# Patient Record
Sex: Female | Born: 1945 | Race: White | Hispanic: No | Marital: Married | Smoking: Former smoker
Health system: Southern US, Community
[De-identification: ages and names within clinical notes are randomized; demographics above are authoritative.]

## PROBLEM LIST (undated history)

## (undated) DIAGNOSIS — D649 Anemia, unspecified: Secondary | ICD-10-CM

## (undated) DIAGNOSIS — IMO0002 Reserved for concepts with insufficient information to code with codable children: Secondary | ICD-10-CM

## (undated) DIAGNOSIS — M1990 Unspecified osteoarthritis, unspecified site: Secondary | ICD-10-CM

## (undated) DIAGNOSIS — I10 Essential (primary) hypertension: Secondary | ICD-10-CM

## (undated) DIAGNOSIS — E785 Hyperlipidemia, unspecified: Secondary | ICD-10-CM

## (undated) DIAGNOSIS — K5792 Diverticulitis of intestine, part unspecified, without perforation or abscess without bleeding: Secondary | ICD-10-CM

## (undated) DIAGNOSIS — R011 Cardiac murmur, unspecified: Secondary | ICD-10-CM

## (undated) DIAGNOSIS — H269 Unspecified cataract: Secondary | ICD-10-CM

## (undated) DIAGNOSIS — E079 Disorder of thyroid, unspecified: Secondary | ICD-10-CM

## (undated) DIAGNOSIS — B019 Varicella without complication: Secondary | ICD-10-CM

## (undated) DIAGNOSIS — R030 Elevated blood-pressure reading, without diagnosis of hypertension: Secondary | ICD-10-CM

## (undated) DIAGNOSIS — K219 Gastro-esophageal reflux disease without esophagitis: Secondary | ICD-10-CM

## (undated) DIAGNOSIS — M329 Systemic lupus erythematosus, unspecified: Secondary | ICD-10-CM

## (undated) HISTORY — DX: Reserved for concepts with insufficient information to code with codable children: IMO0002

## (undated) HISTORY — DX: Elevated blood-pressure reading, without diagnosis of hypertension: R03.0

## (undated) HISTORY — DX: Systemic lupus erythematosus, unspecified: M32.9

## (undated) HISTORY — PX: TONSILLECTOMY AND ADENOIDECTOMY: SUR1326

## (undated) HISTORY — PX: KNEE ARTHROSCOPY W/ ACL RECONSTRUCTION: SHX1858

## (undated) HISTORY — DX: Hyperlipidemia, unspecified: E78.5

## (undated) HISTORY — DX: Cardiac murmur, unspecified: R01.1

## (undated) HISTORY — PX: BREAST CYST ASPIRATION: SHX578

## (undated) HISTORY — PX: TUBAL LIGATION: SHX77

## (undated) HISTORY — DX: Anemia, unspecified: D64.9

## (undated) HISTORY — DX: Diverticulitis of intestine, part unspecified, without perforation or abscess without bleeding: K57.92

## (undated) HISTORY — PX: CATARACT EXTRACTION, BILATERAL: SHX1313

## (undated) HISTORY — DX: Gastro-esophageal reflux disease without esophagitis: K21.9

## (undated) HISTORY — DX: Unspecified cataract: H26.9

## (undated) HISTORY — DX: Varicella without complication: B01.9

## (undated) HISTORY — DX: Disorder of thyroid, unspecified: E07.9

## (undated) HISTORY — DX: Unspecified osteoarthritis, unspecified site: M19.90

---
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
## 1985-02-25 HISTORY — PX: ABDOMINAL HYSTERECTOMY: SHX81

## 1993-02-25 HISTORY — PX: EYE SURGERY: SHX253

## 2005-02-09 LAB — HM COLONOSCOPY: HM Colonoscopy: NORMAL

## 2011-06-14 ENCOUNTER — Telehealth: Payer: Self-pay

## 2011-06-14 ENCOUNTER — Other Ambulatory Visit: Payer: Self-pay | Admitting: Family Medicine

## 2011-06-14 NOTE — Telephone Encounter (Signed)
PT STATES THE PHARMACY HAD CALLED FOR A REFILL ON HER LISINOPRIL AND WAS TOLD WE DIDN'T HAVE ANYONE BY THAT NAME PLEASE CALL PT AT [phone]    [facility] ON [address] IN [place]

## 2011-06-14 NOTE — Telephone Encounter (Signed)
Patient states that [facility] called our clinic and clinic told them that we didn't have a patient by that name so we couldn't feel her prescription for lisinopril.  Can we send this to [facility] on [address]?

## 2011-06-15 MED ORDER — LISINOPRIL 10 MG PO TABS: 10.0000 mg | ORAL_TABLET | Freq: Every day | ORAL | Status: DC

## 2011-06-15 NOTE — Telephone Encounter (Signed)
Done via phone message 06/15/2011

## 2011-06-15 NOTE — Telephone Encounter (Signed)
Spoke with pt and advised her RX at pharmacy and to recheck before RX runs out. Pt understood

## 2011-06-15 NOTE — Telephone Encounter (Signed)
Please advise patient that the rx was sent to her pharmacy and that she's due for follow-up before the next fill.

## 2011-07-05 ENCOUNTER — Ambulatory Visit (INDEPENDENT_AMBULATORY_CARE_PROVIDER_SITE_OTHER): Payer: Self-pay | Admitting: Family Medicine

## 2011-07-05 VITALS — BP 148/75 | HR 79 | Temp 98.1°F | Resp 16 | Ht 67.5 in | Wt 185.4 lb

## 2011-07-05 DIAGNOSIS — R109 Unspecified abdominal pain: Secondary | ICD-10-CM

## 2011-07-05 LAB — POCT UA - MICROSCOPIC ONLY
Crystals, Ur, HPF, POC: NEGATIVE
Epithelial cells, urine per micros: NEGATIVE
Yeast, UA: NEGATIVE

## 2011-07-05 LAB — POCT CBC
Granulocyte percent: 60.9 %G (ref 37–80)
Hemoglobin: 12.4 g/dL (ref 12.2–16.2)
MCH, POC: 28.8 pg (ref 27–31.2)
MCV: 91.1 fL (ref 80–97)
MPV: 7.3 fL (ref 0–99.8)
RBC: 4.31 M/uL (ref 4.04–5.48)
WBC: 6.4 10*3/uL (ref 4.6–10.2)

## 2011-07-05 LAB — POCT URINALYSIS DIPSTICK
Blood, UA: NEGATIVE
Protein, UA: NEGATIVE
Spec Grav, UA: 1.015
Urobilinogen, UA: 0.2
pH, UA: 7

## 2011-07-05 MED ORDER — METRONIDAZOLE 500 MG PO TABS: 500.0000 mg | ORAL_TABLET | Freq: Four times a day (QID) | ORAL | Status: AC

## 2011-07-05 MED ORDER — CIPROFLOXACIN HCL 750 MG PO TABS: 750.0000 mg | ORAL_TABLET | Freq: Two times a day (BID) | ORAL | Status: AC

## 2011-07-05 NOTE — Progress Notes (Signed)
Patient Name: Brenda Leon Date of Birth: [birth year] Medical Record Number: [id number] Gender: female Date of Encounter: 07/05/2011  History of Present Illness:  Brenda Leon is a 66 y.o. very pleasant female patient who presents with the following:  She has had abdominal pain for about 6 weeks.  It stays pretty constant but is worse after eating.  Her belly feels sore to the touch, most in the left lower quadrant.  She had never had this before.  No nausea or vomiting,  Notes that she does not seem to have quite as much stool output as she would expect, but no BRPBR , no melena.  No change in pain with type of foot eaten.    Her last colonoscopy was done in about 2005.  No history of diverticulitis.  She was told to follow- up in 5 years but that her colonoscopy was normal.    She also notes that "my energy and stamina have just kind of tanked" over the last few weeks.  Not sleeping well- this is not unusual for her; she has a "chroinic bulging disc" in her back.  This is better but she still has occasional pain in her back and leg.    She quit smoking in 1988  There is no problem list on file for this patient.  No past medical history on file. No past surgical history on file. History  Substance Use Topics  . Smoking status: Former Games developer  . Smokeless tobacco: Not on file  . Alcohol Use: Not on file   No family history on file. No Known Allergies  Medication list has been reviewed and updated.  Review of Systems: As per HPI- otherwise negative.  Physical Examination: Filed Vitals:   07/05/11 1210  BP: 148/75  Pulse: 79  Temp: 98.1 F (36.7 C)  TempSrc: Oral  Resp: 16  Height: 5' 7.5" (1.715 m)  Weight: 185 lb 6.4 oz (84.097 kg)    Body mass index is 28.61 kg/(m^2).  GEN: WDWN, NAD, Non-toxic, A & O x 3 HEENT: Atraumatic, Normocephalic. Neck supple. No masses, No LAD. Ears and Nose: No external deformity. CV: RRR, No M/G/R. No JVD. No thrill. No extra heart  sounds. PULM: CTA B, no wheezes, crackles, rhonchi. No retractions. No resp. distress. No accessory muscle use. ABD: S, ND, +BS. No rebound. No HSM.  Tender LLQ and suprapubic areas EXTR: No c/c/e NEURO Normal gait.  PSYCH: Normally interactive. Conversant. Not depressed or anxious appearing.  Calm demeanor.  GU: normal external exam, normal bimanual- she is s/p hysterectomy but still has ovaries. She has tenderness in the LLQ and suprapubic areas  Results for orders placed in visit on 07/05/11  POCT URINALYSIS DIPSTICK      Component Value Range   Color, UA yellow     Clarity, UA clear     Glucose, UA neg     Bilirubin, UA neg     Ketones, UA neg     Spec Grav, UA 1.015     Blood, UA neg     pH, UA 7.0     Protein, UA neg     Urobilinogen, UA 0.2     Nitrite, UA neg     Leukocytes, UA Trace    POCT UA - MICROSCOPIC ONLY      Component Value Range   WBC, Ur, HPF, POC 0-1     RBC, urine, microscopic neg     Bacteria, U Microscopic neg     Mucus, UA  neg     Epithelial cells, urine per micros neg     Crystals, Ur, HPF, POC neg     Casts, Ur, LPF, POC neg     Yeast, UA neg    POCT CBC      Component Value Range   WBC 6.4  4.6 - 10.2 (K/uL)   Lymph, poc 2.0  0.6 - 3.4    POC LYMPH PERCENT 31.9  10 - 50 (%L)   MID (cbc) 0.5  0 - 0.9    POC MID % 7.2  0 - 12 (%M)   POC Granulocyte 3.9  2 - 6.9    Granulocyte percent 60.9  37 - 80 (%G)   RBC 4.31  4.04 - 5.48 (M/uL)   Hemoglobin 12.4  12.2 - 16.2 (g/dL)   HCT, POC 62.1  30.8 - 47.9 (%)   MCV 91.1  80 - 97 (fL)   MCH, POC 28.8  27 - 31.2 (pg)   MCHC 31.6 (*) 31.8 - 35.4 (g/dL)   RDW, POC 65.7     Platelet Count, POC 294  142 - 424 (K/uL)   MPV 7.3  0 - 99.8 (fL)  IFOBT (OCCULT BLOOD)      Component Value Range   IFOBT Negative      Assessment and Plan: 1. Abdominal  pain, other specified site  POCT urinalysis dipstick, POCT UA - Microscopic Only, POCT CBC, IFOBT POC (occult bld, rslt in office), ciprofloxacin (CIPRO)  750 MG tablet, metroNIDAZOLE (FLAGYL) 500 MG tablet   Brenda Leon has abdominal pain of some duration.  The etiology is uncertain, but location is suspicious for diverticulitis.  She would like to do a trial of therapy before moving to more advanced diagnostic techniques as she is currently without insurance until her medicare starts in August.  Will try flagyl and cipro as above, avoid p3articulate foods.  Patient (or parent if minor) instructed to return to clinic or call if not better in 3 day(s).  Sooner if worse.   Next step should be either a CT or referral to GI

## 2011-07-24 ENCOUNTER — Encounter: Payer: Self-pay | Admitting: Family Medicine

## 2011-08-05 NOTE — Progress Notes (Signed)
This encounter was created in error - please disregard.

## 2011-09-13 ENCOUNTER — Other Ambulatory Visit: Payer: Self-pay | Admitting: Physician Assistant

## 2011-09-14 ENCOUNTER — Other Ambulatory Visit: Payer: Self-pay | Admitting: Physician Assistant

## 2011-12-11 ENCOUNTER — Telehealth: Payer: Self-pay

## 2011-12-11 NOTE — Telephone Encounter (Signed)
We have advised patient she needs follow up since July, I do not feel comfortable approving until December, please advise. Amy

## 2011-12-11 NOTE — Telephone Encounter (Signed)
Spoke with pt advised to RTC. Pt understood. 

## 2011-12-11 NOTE — Telephone Encounter (Signed)
Yes, patient needs OV

## 2011-12-11 NOTE — Telephone Encounter (Signed)
Patient is calling for lisinopril refill. She has a new PCP in [place] but does not have an appointment with them until December and would like a refill to hold her over until then.  Best [phone]

## 2011-12-12 ENCOUNTER — Ambulatory Visit (INDEPENDENT_AMBULATORY_CARE_PROVIDER_SITE_OTHER): Payer: Medicare Other | Admitting: Emergency Medicine

## 2011-12-12 VITALS — BP 158/68 | HR 83 | Temp 97.9°F | Resp 18 | Ht 67.5 in | Wt 189.6 lb

## 2011-12-12 DIAGNOSIS — I10 Essential (primary) hypertension: Secondary | ICD-10-CM

## 2011-12-12 DIAGNOSIS — Z1231 Encounter for screening mammogram for malignant neoplasm of breast: Secondary | ICD-10-CM

## 2011-12-12 MED ORDER — LISINOPRIL 30 MG PO TABS: 30.0000 mg | ORAL_TABLET | Freq: Every day | ORAL | Status: DC

## 2011-12-12 NOTE — Progress Notes (Signed)
[facility] [address], [place] [address] [phone]  Date:  12/12/2011   Name:  Brenda Leon   DOB:  05/10/1945   MRN:  [id number]  PCP:  No primary provider on file.    Chief Complaint: Hypertension   History of Present Illness:  Brenda Leon is a 66 y.o. very pleasant female patient who presents with the following:  Hypertension for a long time and has been treated with lisinopril successfully.  Now needs a refill on her medication.  Has been without healt insurance for a long time and subsequently has no recent (years) labs or mammogram.  Not symptomatic and has no current complaints.  There is no problem list on file for this patient.   No past medical history on file.  No past surgical history on file.  History  Substance Use Topics  . Smoking status: Former Games developer  . Smokeless tobacco: Not on file  . Alcohol Use: Not on file    No family history on file.  No Known Allergies  Medication list has been reviewed and updated.  Current Outpatient Prescriptions on File Prior to Visit  Medication Sig Dispense Refill  . lisinopril (PRINIVIL,ZESTRIL) 10 MG tablet TAKE ONE TABLET BY MOUTH EVERY DAY, PLEASE MAKE AN APPOINTMENT FOR MORE REFILLS  90 tablet  0  . omeprazole (PRILOSEC) 10 MG capsule Take 10 mg by mouth daily.        Review of Systems:  As per HPI, otherwise negative.    Physical Examination: Filed Vitals:   12/12/11 1239  BP: 158/68  Pulse: 83  Temp: 97.9 F (36.6 C)  Resp: 18   Filed Vitals:   12/12/11 1239  Height: 5' 7.5" (1.715 m)  Weight: 189 lb 9.6 oz (86.002 kg)   Body mass index is 29.26 kg/(m^2). Ideal Body Weight: Weight in (lb) to have BMI = 25: 161.7   GEN: WDWN, NAD, Non-toxic, A & O x 3 HEENT: Atraumatic, Normocephalic. Neck supple. No masses, No LAD. Ears and Nose: No external deformity. CV: RRR, No M/G/R. No JVD. No thrill. No extra heart sounds. PULM: CTA B, no wheezes, crackles, rhonchi. No  retractions. No resp. distress. No accessory muscle use. ABD: S, NT, ND, +BS. No rebound. No HSM. EXTR: No c/c/e NEURO Normal gait.  PSYCH: Normally interactive. Conversant. Not depressed or anxious appearing.  Calm demeanor.    Assessment and Plan: Hypertension Increase lisinopril labs  Carmelina Dane, MD  I have reviewed and agree with documentation. Robert P. Merla Riches, M.D.

## 2011-12-20 ENCOUNTER — Other Ambulatory Visit (INDEPENDENT_AMBULATORY_CARE_PROVIDER_SITE_OTHER): Payer: Medicare Other

## 2011-12-20 VITALS — BP 118/76 | HR 69 | Temp 98.1°F | Resp 18 | Ht 67.5 in | Wt 185.0 lb

## 2011-12-20 DIAGNOSIS — I10 Essential (primary) hypertension: Secondary | ICD-10-CM

## 2011-12-20 DIAGNOSIS — E782 Mixed hyperlipidemia: Secondary | ICD-10-CM

## 2011-12-20 LAB — COMPREHENSIVE METABOLIC PANEL
AST: 20 U/L (ref 0–37)
Albumin: 4.5 g/dL (ref 3.5–5.2)
Alkaline Phosphatase: 57 U/L (ref 39–117)
BUN: 20 mg/dL (ref 6–23)
Creat: 0.61 mg/dL (ref 0.50–1.10)
Glucose, Bld: 92 mg/dL (ref 70–99)
Total Bilirubin: 0.5 mg/dL (ref 0.3–1.2)

## 2011-12-20 LAB — POCT URINALYSIS DIPSTICK
Blood, UA: NEGATIVE
Nitrite, UA: NEGATIVE
Protein, UA: NEGATIVE
Spec Grav, UA: 1.01
Urobilinogen, UA: 0.2
pH, UA: 7

## 2011-12-20 LAB — LIPID PANEL
HDL: 54 mg/dL (ref >39)
LDL Cholesterol: 189 mg/dL — ABNORMAL HIGH (ref 0–99)
Total CHOL/HDL Ratio: 4.8 Ratio
Triglycerides: 85 mg/dL (ref <150)
VLDL: 17 mg/dL (ref 0–40)

## 2011-12-20 LAB — POCT CBC
Hemoglobin: 12.7 g/dL (ref 12.2–16.2)
Lymph, poc: 1.6 (ref 0.6–3.4)
MCH, POC: 28.9 pg (ref 27–31.2)
MCHC: 30.5 g/dL — AB (ref 31.8–35.4)
MCV: 95 fL (ref 80–97)
MID (cbc): 0.4 (ref 0–0.9)
MPV: 7.6 fL (ref 0–99.8)
POC MID %: 8.1 %M (ref 0–12)
RBC: 4.39 M/uL (ref 4.04–5.48)
WBC: 5.5 10*3/uL (ref 4.6–10.2)

## 2011-12-20 LAB — POCT UA - MICROSCOPIC ONLY
Casts, Ur, LPF, POC: NEGATIVE
Crystals, Ur, HPF, POC: NEGATIVE
Mucus, UA: NEGATIVE
Yeast, UA: NEGATIVE

## 2011-12-20 NOTE — Progress Notes (Signed)
Patient in office today for labs only. 

## 2011-12-21 MED ORDER — ATORVASTATIN CALCIUM 20 MG PO TABS: 20.0000 mg | ORAL_TABLET | Freq: Every day | ORAL | Status: DC

## 2011-12-21 NOTE — Addendum Note (Signed)
Addended by: Carmelina Dane on: 12/21/2011 03:17 PM   Modules accepted: Orders

## 2011-12-23 ENCOUNTER — Telehealth: Payer: Self-pay

## 2011-12-23 DIAGNOSIS — E7800 Pure hypercholesterolemia, unspecified: Secondary | ICD-10-CM

## 2011-12-23 NOTE — Telephone Encounter (Signed)
I spoke to patient, she wants to try a strict diet for 6 weeks and have lipids rechecked, is it okay to order lab only visit in 6 weeks for recheck lipid panel?

## 2011-12-23 NOTE — Telephone Encounter (Signed)
Yes, please.

## 2011-12-23 NOTE — Telephone Encounter (Signed)
Pt was prescribed Lipitor on last visit 12/12/11 w/ Dr. Dareen Piano.  She would like to defer taking this medication for 4-6 weeks.  She states she is making significant changes to her lifestyle and wants to know if this would be ok.   [phone] best number.

## 2012-01-03 ENCOUNTER — Ambulatory Visit (HOSPITAL_COMMUNITY)
Admission: RE | Admit: 2012-01-03 | Discharge: 2012-01-03 | Disposition: A | Discharge status: Home / Self Care | Payer: Medicare Other | Source: Ambulatory Visit | Attending: Emergency Medicine | Admitting: Emergency Medicine

## 2012-01-03 DIAGNOSIS — I10 Essential (primary) hypertension: Secondary | ICD-10-CM

## 2012-01-03 DIAGNOSIS — Z1231 Encounter for screening mammogram for malignant neoplasm of breast: Secondary | ICD-10-CM

## 2012-01-10 ENCOUNTER — Telehealth: Payer: Self-pay

## 2012-01-10 NOTE — Telephone Encounter (Signed)
Pt called back to get MM results. She had received the normal results from imaging center. Verified w/pt that the results/recommendations stated normal f/up exam in 1 year, but that they are requesting previous MM for comparison. Pt agreed to call imaging center to ask whether she needs to get them previous results and if any other f/up is needed.

## 2012-02-10 ENCOUNTER — Encounter: Payer: Self-pay | Admitting: Internal Medicine

## 2012-02-10 ENCOUNTER — Ambulatory Visit (INDEPENDENT_AMBULATORY_CARE_PROVIDER_SITE_OTHER): Payer: Medicare Other | Admitting: Internal Medicine

## 2012-02-10 ENCOUNTER — Other Ambulatory Visit: Payer: Self-pay

## 2012-02-10 VITALS — BP 120/62 | HR 73 | Temp 97.7°F | Resp 12 | Ht 67.0 in | Wt 191.0 lb

## 2012-02-10 DIAGNOSIS — Z1331 Encounter for screening for depression: Secondary | ICD-10-CM

## 2012-02-10 DIAGNOSIS — Z23 Encounter for immunization: Secondary | ICD-10-CM

## 2012-02-10 DIAGNOSIS — M5430 Sciatica, unspecified side: Secondary | ICD-10-CM

## 2012-02-10 DIAGNOSIS — M5440 Lumbago with sciatica, unspecified side: Secondary | ICD-10-CM | POA: Insufficient documentation

## 2012-02-10 DIAGNOSIS — I83893 Varicose veins of bilateral lower extremities with other complications: Secondary | ICD-10-CM

## 2012-02-10 DIAGNOSIS — M79606 Pain in leg, unspecified: Secondary | ICD-10-CM

## 2012-02-10 DIAGNOSIS — M79609 Pain in unspecified limb: Secondary | ICD-10-CM

## 2012-02-10 DIAGNOSIS — M329 Systemic lupus erythematosus, unspecified: Secondary | ICD-10-CM

## 2012-02-10 DIAGNOSIS — B351 Tinea unguium: Secondary | ICD-10-CM

## 2012-02-10 DIAGNOSIS — R252 Cramp and spasm: Secondary | ICD-10-CM

## 2012-02-10 DIAGNOSIS — E785 Hyperlipidemia, unspecified: Secondary | ICD-10-CM

## 2012-02-10 DIAGNOSIS — K219 Gastro-esophageal reflux disease without esophagitis: Secondary | ICD-10-CM

## 2012-02-10 DIAGNOSIS — I83819 Varicose veins of unspecified lower extremities with pain: Secondary | ICD-10-CM

## 2012-02-10 MED ORDER — RED YEAST RICE EXTRACT 600 MG PO CAPS: 1.0000 | ORAL_CAPSULE | Freq: Every day | ORAL | Status: DC

## 2012-02-10 NOTE — Assessment & Plan Note (Signed)
Nocturnal,  improved with quinine. checking muscle enzyme and labs.

## 2012-02-10 NOTE — Assessment & Plan Note (Addendum)
Never treated due to patient reluctance to start statins.  Trial of red yeast rice

## 2012-02-10 NOTE — Progress Notes (Signed)
Patient ID: Brenda Leon, female   DOB: 1945/06/28, 66 y.o.   MRN: [id number]   Patient Active Problem List  Diagnosis  . Leg pain, diffuse  . SLE (systemic lupus erythematosus)  . GERD (gastroesophageal reflux disease)  . Varicose veins of leg with pain  . Lumbago with sciatica  . Hyperlipidemia LDL goal < 130    Subjective:  CC:   Chief Complaint  Patient presents with  . Establish Care    HPI:   Brenda Leon is a 66 y.o. female who presents as a new patient to establish primary care with the chief complaint of  Lower back pain  Secondary to ruptured disk which occurred afer lifting a vacuum cleaner.  Had sciatica at the time which still ocjurs aggrabvated by prlonged sitting and standing.  Was uninsured so used exercise .  Referred pain mostly on the right  Below the knee but not to the ankle.  Pain in hips has been more recently which is disturbing sleep.  Using  and gabapentin at night for the past 3 or 4 months with naproxen which helps her sleep a little better. Has had MRI x 2 in [place] by Sharolyn Douglas  2) muscle cramps in legs and hands aggravated by housework,  Calves tender .  History of SLE 1996 diagnosed, diagnosed with skin biopsy and  Blood tests whiling living in [place] .  Took On Placquenil for years, stopped it in 2007 .  Repeat labs were done after move to [place] were all normal..  Has not been to a rheumatologist.   Lives iin [place].   Has not had diffuse joint pain or fatigue in years.  Had some discoid lesions.  Has hyperlipidemia LDL 189 but did not start the lipitor. 4)  history of thyroid nodule incompletel biopsy attempt x 2. treated with Synthroid for years.  synthroid stopped  Years ago and tsh was recently normal.  5) oNYCHMYCOSIS.,  NEEDS PODIATRY REFERRAL TOO.  Has tried oral antifungals x 2 without changed.  Tried topical penlac. No change.   5) varicose veins. Painful at times.  Right greater than left. 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  ,  6) Needs DEXA scan.  7) Last  colonoscopy 2006 , normal.  No FH of colon CA .  8) s/p hysterectomy 1987 for heavy bleeding and fibroids . ,    Past Medical History  Diagnosis Date  . Cataracts, bilateral   . Thyroid disease   . Arthritis   . Chicken pox   . Diverticulitis   . GERD (gastroesophageal reflux disease)   . Elevated blood pressure reading   . Lupus   . SLE (systemic lupus erythematosus)     diagnosed with skin biopsy and serology    Past Surgical History  Procedure Date  . Tonsillectomy and adenoidectomy   . Knee arthroscopy w/ acl reconstruction   . Abdominal hysterectomy 1987  . Appendectomy 1952  . Eye surgery 1995    x2 1995 and 1996  . Breast biopsy 2004    Family History  Problem Relation Age of Onset  . Aneurysm Mother   . Hypertension Father   . Diabetes Sister   . Hyperlipidemia Brother   . Hypertension Son   . Hyperlipidemia Son     History   Social History  . Marital Status: Married    Spouse Name: N/A    Number of Children: N/A  . Years of Education: N/A   Occupational History  . Not  on file.   Social History Main Topics  . Smoking status: Former Games developer  . Smokeless tobacco: Not on file  . Alcohol Use: 7.0 oz/week    14 drink(s) per week  . Drug Use: Not on file  . Sexually Active: Not on file   Other Topics Concern  . Not on file   Social History Narrative  . No narrative on file       No Known Allergies   Review of Systems:   The remainder of the review of systems was negative except those addressed in the HPI.       Objective:  BP 120/62  Pulse 73  Temp 97.7 F (36.5 C) (Oral)  Resp 12  Ht 5\' 7"  (1.702 m)  Wt 191 lb (86.637 kg)  BMI 29.91 kg/m2  SpO2 93%  General appearance: alert, cooperative and appears stated age Ears: normal TM's and external ear canals both ears Throat: lips, mucosa, and tongue normal; teeth and gums normal Neck: no adenopathy, no carotid bruit, supple, symmetrical, trachea midline and thyroid not enlarged,  symmetric, no tenderness/mass/nodules Back: symmetric, no curvature. ROM normal. No CVA tenderness. Lungs: clear to auscultation bilaterally Heart: regular rate and rhythm, S1, S2 normal, no murmur, click, rub or gallop Abdomen: soft, non-tender; bowel sounds normal; no masses,  no organomegaly Pulses: 2+ and symmetric Skin: Skin color, texture, turgor normal. No rashes or lesions Lymph nodes: Cervical, supraclavicular, and axillary nodes normal.  Assessment and Plan:  GERD (gastroesophageal reflux disease) Managed with PPI   Leg pain, diffuse Nocturnal,  improved with quinine. checking muscle enzyme and labs.    Varicose veins of leg with pain Does not want sclerosing therapy.  Continue use of thigh high compression stockings  Lumbago with sciatica Managed conservatively since injury.   Hyperlipidemia LDL goal < 130 Never treated due to patient reluctance to start statins.  Trial of red yeast rice   Updated Medication List Outpatient Encounter Prescriptions as of 02/10/2012  Medication Sig Dispense Refill  . Ascorbic Acid (VITAMIN C) 100 MG tablet Take 100 mg by mouth daily.      Marland Kitchen b complex vitamins tablet Take 1 tablet by mouth daily.      . fish oil-omega-3 fatty acids 1000 MG capsule Take 2 g by mouth daily.      . Flaxseed, Linseed, (FLAX SEED OIL PO) Take by mouth.      . Ginkgo Biloba 40 MG TABS Take by mouth daily.      Marland Kitchen lisinopril (PRINIVIL,ZESTRIL) 30 MG tablet Take 1 tablet (30 mg total) by mouth daily.  90 tablet  0  . naproxen (NAPROSYN) 250 MG tablet Take 250 mg by mouth 2 (two) times daily with a meal.      . omeprazole (PRILOSEC) 10 MG capsule Take 10 mg by mouth daily.      . vitamin E 100 UNIT capsule Take 100 Units by mouth daily.      . Red Yeast Rice Extract 600 MG CAPS Take 1 capsule (600 mg total) by mouth daily.  60 capsule  0  . [DISCONTINUED] atorvastatin (LIPITOR) 20 MG tablet Take 1 tablet (20 mg total) by mouth daily.  90 tablet  3      Orders Placed This Encounter  Procedures  . HM MAMMOGRAPHY  . Pneumococcal polysaccharide vaccine 23-valent greater than or equal to 2yo subcutaneous/IM  . Lipid panel  . Magnesium  . CK  . HM PAP SMEAR  . Ambulatory referral to Podiatry  .  HM COLONOSCOPY    No Follow-up on file.

## 2012-02-10 NOTE — Assessment & Plan Note (Signed)
Does not want sclerosing therapy.  Continue use of thigh high compression stockings

## 2012-02-10 NOTE — Patient Instructions (Addendum)
Try red yeast rice capsules 600 mg twice daily  For the cholesterol   Endocrinology ,  Podiatry and DEXA to  Be schedule  Pneumovax was given today  Return for fasting labs   Return in 3 months for annual pelvic  Next colonoscopy 2016

## 2012-02-10 NOTE — Assessment & Plan Note (Signed)
Managed with PPI. 

## 2012-02-10 NOTE — Assessment & Plan Note (Signed)
Managed conservatively since injury.

## 2012-02-14 ENCOUNTER — Other Ambulatory Visit (INDEPENDENT_AMBULATORY_CARE_PROVIDER_SITE_OTHER): Payer: Medicare Other

## 2012-02-14 DIAGNOSIS — R252 Cramp and spasm: Secondary | ICD-10-CM

## 2012-02-14 DIAGNOSIS — E785 Hyperlipidemia, unspecified: Secondary | ICD-10-CM

## 2012-02-14 LAB — LIPID PANEL
HDL: 47 mg/dL (ref >39.00)
VLDL: 18 mg/dL (ref 0.0–40.0)

## 2012-02-14 LAB — LDL CHOLESTEROL, DIRECT: Direct LDL: 167.8 mg/dL

## 2012-02-14 LAB — CK: Total CK: 67 U/L (ref 7–177)

## 2012-02-23 ENCOUNTER — Encounter: Payer: Self-pay | Admitting: Internal Medicine

## 2012-02-23 DIAGNOSIS — M810 Age-related osteoporosis without current pathological fracture: Secondary | ICD-10-CM

## 2012-02-23 DIAGNOSIS — E041 Nontoxic single thyroid nodule: Secondary | ICD-10-CM

## 2012-02-24 ENCOUNTER — Encounter: Payer: Self-pay | Admitting: Internal Medicine

## 2012-03-09 ENCOUNTER — Other Ambulatory Visit: Payer: Self-pay | Admitting: Internal Medicine

## 2012-03-09 ENCOUNTER — Encounter: Payer: Self-pay | Admitting: Internal Medicine

## 2012-03-09 DIAGNOSIS — I8390 Asymptomatic varicose veins of unspecified lower extremity: Secondary | ICD-10-CM

## 2012-03-09 MED ORDER — LISINOPRIL 30 MG PO TABS: 30.0000 mg | ORAL_TABLET | Freq: Every day | ORAL | Status: DC

## 2012-03-09 NOTE — Telephone Encounter (Signed)
lisinopril (PRINIVIL,ZESTRIL) 30 MG tablet  # 90

## 2012-03-09 NOTE — Telephone Encounter (Signed)
Med filled.  

## 2012-03-11 ENCOUNTER — Encounter: Payer: Self-pay | Admitting: Internal Medicine

## 2012-03-17 ENCOUNTER — Other Ambulatory Visit: Payer: Self-pay | Admitting: *Deleted

## 2012-03-17 DIAGNOSIS — M79609 Pain in unspecified limb: Secondary | ICD-10-CM

## 2012-03-17 DIAGNOSIS — I83893 Varicose veins of bilateral lower extremities with other complications: Secondary | ICD-10-CM

## 2012-04-03 ENCOUNTER — Encounter: Payer: Medicare Other | Admitting: Vascular Surgery

## 2012-04-13 ENCOUNTER — Ambulatory Visit: Payer: Self-pay | Admitting: Internal Medicine

## 2012-04-14 ENCOUNTER — Encounter: Payer: Self-pay | Admitting: Internal Medicine

## 2012-04-20 ENCOUNTER — Encounter: Payer: Medicare Other | Admitting: Surgery

## 2012-04-22 ENCOUNTER — Encounter: Payer: Self-pay | Admitting: Internal Medicine

## 2012-04-28 ENCOUNTER — Encounter: Payer: Self-pay | Admitting: Internal Medicine

## 2012-04-29 ENCOUNTER — Encounter: Payer: Self-pay | Admitting: Internal Medicine

## 2012-04-29 ENCOUNTER — Telehealth: Payer: Self-pay | Admitting: Internal Medicine

## 2012-04-29 DIAGNOSIS — I10 Essential (primary) hypertension: Secondary | ICD-10-CM | POA: Insufficient documentation

## 2012-04-29 NOTE — Telephone Encounter (Signed)
I have drafted a letter to help Brenda Leon get her associate his membership covered by inadequate health insurance. Please call her and ask her if she would like [place] to mail it to her or fax to [place]

## 2012-05-04 ENCOUNTER — Encounter: Payer: Medicare Other | Admitting: Surgery

## 2012-05-04 ENCOUNTER — Telehealth: Payer: Self-pay | Admitting: *Deleted

## 2012-05-04 ENCOUNTER — Other Ambulatory Visit (INDEPENDENT_AMBULATORY_CARE_PROVIDER_SITE_OTHER): Payer: Medicare Other

## 2012-05-04 ENCOUNTER — Encounter: Payer: Self-pay | Admitting: *Deleted

## 2012-05-04 DIAGNOSIS — Z79899 Other long term (current) drug therapy: Secondary | ICD-10-CM

## 2012-05-04 DIAGNOSIS — E785 Hyperlipidemia, unspecified: Secondary | ICD-10-CM

## 2012-05-04 DIAGNOSIS — E663 Overweight: Secondary | ICD-10-CM

## 2012-05-04 DIAGNOSIS — E7800 Pure hypercholesterolemia, unspecified: Secondary | ICD-10-CM

## 2012-05-04 NOTE — Telephone Encounter (Signed)
What labs and dx would you like for this pt? Thank you  

## 2012-05-04 NOTE — Telephone Encounter (Signed)
Went to order, an they were already ordered by Marice Potter?  Who is that

## 2012-05-05 LAB — LIPID PANEL
Cholesterol: 201 mg/dL — ABNORMAL HIGH (ref 0–200)
LDL Cholesterol: 133 mg/dL — ABNORMAL HIGH (ref 0–99)
Triglycerides: 106 mg/dL (ref <150)

## 2012-05-07 ENCOUNTER — Telehealth: Payer: Self-pay

## 2012-05-07 NOTE — Telephone Encounter (Signed)
Patient says lab orders that were ordered at [facility] by Dr Darrick Huntsman somehow were sent here to Dr Dareen Piano and we gave her the test results. Patient was confused and not happy. Spoke to [place] who said she would look into it so see how it happened and so it doesn't happen again.  Best # for patient [phone]

## 2012-05-07 NOTE — Telephone Encounter (Signed)
Pt advised that she formerly seen Dr. Dareen Piano. The labs had been placed as future by him and were released when pt came in for the labwork. That is why the results went to his office.

## 2012-05-07 NOTE — Telephone Encounter (Signed)
Spoke with patient. Advised that the assistant at Dr Melina Schools office put 3 orders in that day and one of those orders was put in under Dr [facility]'s name. Therefore, the results were sent to him.

## 2012-05-15 ENCOUNTER — Encounter: Payer: Self-pay | Admitting: Surgery

## 2012-05-18 ENCOUNTER — Encounter: Payer: Self-pay | Admitting: Surgery

## 2012-05-18 ENCOUNTER — Encounter (INDEPENDENT_AMBULATORY_CARE_PROVIDER_SITE_OTHER): Payer: Medicare Other | Admitting: Vascular Surgery

## 2012-05-18 ENCOUNTER — Ambulatory Visit (INDEPENDENT_AMBULATORY_CARE_PROVIDER_SITE_OTHER): Payer: Medicare Other | Admitting: Surgery

## 2012-05-18 VITALS — BP 138/65 | HR 72 | Ht 67.0 in | Wt 190.0 lb

## 2012-05-18 DIAGNOSIS — I83893 Varicose veins of bilateral lower extremities with other complications: Secondary | ICD-10-CM

## 2012-05-18 DIAGNOSIS — M79609 Pain in unspecified limb: Secondary | ICD-10-CM

## 2012-05-18 NOTE — Progress Notes (Signed)
[facility]   Patient name: Brenda Leon MRN: [id number] DOB: [birth year] Sex: female   Referred by: Dr. Darrick Huntsman  Reason for referral:  Chief Complaint  Patient presents with  . Varicose Veins    vv's and leg pain right greater than left    HISTORY OF PRESENT ILLNESS: The patient comes in today for evaluation of varicose veins behind her right knee. She states that she has been having problems with heaviness and calf tenderness for many years however it has become more severe particularly behind the right knee. She reports minimal swelling. She is on her feet a lot during the day and this is when it bothers her the most. The patient has a history of lupus and hypertension.  Past Medical History  Diagnosis Date  . Cataracts, bilateral   . Thyroid disease   . Arthritis   . Chicken pox   . Diverticulitis   . GERD (gastroesophageal reflux disease)   . Elevated blood pressure reading   . Lupus   . SLE (systemic lupus erythematosus)     diagnosed with skin biopsy and serology  . Anemia     Past Surgical History  Procedure Laterality Date  . Tonsillectomy and adenoidectomy    . Knee arthroscopy w/ acl reconstruction    . Abdominal hysterectomy  1987  . Appendectomy  1952  . Eye surgery  1995    x2 1995 and 1996  . Breast biopsy  2004  . Cataract extraction, bilateral Bilateral 1995, 1996    History   Social History  . Marital Status: Married    Spouse Name: N/A    Number of Children: N/A  . Years of Education: N/A   Occupational History  . Not on file.   Social History Main Topics  . Smoking status: Former Smoker    Quit date: 02/25/1986  . Smokeless tobacco: Not on file  . Alcohol Use: 7.0 oz/week    14 drink(s) per week  . Drug Use: No  . Sexually Active: Not on file   Other Topics Concern  . Not on file   Social History Narrative  . No narrative on file    Family History  Problem Relation Age of Onset  . Aneurysm Mother      AAA  . Other Mother     varicose veins  . Hypertension Father   . Heart disease Father   . Heart attack Father   . Diabetes Sister   . Hyperlipidemia Sister   . Hypertension Sister   . Hyperlipidemia Brother   . Hypertension Son   . Hyperlipidemia Son   . Hyperlipidemia Sister     Allergies as of 05/18/2012  . (No Known Allergies)    Current Outpatient Prescriptions on File Prior to Visit  Medication Sig Dispense Refill  . Ascorbic Acid (VITAMIN C) 100 MG tablet Take 100 mg by mouth daily.      Marland Kitchen b complex vitamins tablet Take 1 tablet by mouth daily.      . Biotin 1000 MCG tablet Take 1,000 mcg by mouth 3 (three) times daily.      . fish oil-omega-3 fatty acids 1000 MG capsule Take 2 g by mouth daily.      . Flaxseed, Linseed, (FLAX SEED OIL PO) Take by mouth.      Marland Kitchen lisinopril (PRINIVIL,ZESTRIL) 30 MG tablet Take 1 tablet (30 mg total) by mouth daily.  90 tablet  0  . naproxen (NAPROSYN) 250 MG  tablet Take 250 mg by mouth 2 (two) times daily with a meal.      . omeprazole (PRILOSEC) 10 MG capsule Take 20 mg by mouth daily.       . Red Yeast Rice Extract 600 MG CAPS Take 1 capsule (600 mg total) by mouth daily.  60 capsule  0  . terbinafine (LAMISIL) 250 MG tablet Take 250 mg by mouth daily.      . vitamin E 100 UNIT capsule Take 1,000 Units by mouth daily.       Marland Kitchen gabapentin (NEURONTIN) 100 MG capsule Take 100 mg by mouth 3 (three) times daily.      . Ginkgo Biloba 40 MG TABS Take by mouth daily.       No current facility-administered medications on file prior to visit.     REVIEW OF SYSTEMS: Cardiovascular: Positive for pain in her legs and walking and when lying flat. Positive for varicose veins Pulmonary: Negative Neuro: Negative Hematology: Negative GI: Negative GU: Negative Psychiatry: Negative Skin: Negative Constitutional: Negative  PHYSICAL EXAMINATION: General: The patient appears their stated age.  Vital signs are BP 138/65  Pulse 72  Ht 5\' 7"   (1.702 m)  Wt 190 lb (86.183 kg)  BMI 29.75 kg/m2  SpO2 100% HEENT:  No gross abnormalities Pulmonary: Respirations are non-labored Musculoskeletal: There are no major deformities.   Neurologic: No focal weakness or paresthesias are detected, Skin: There are no ulcer or rashes noted. Psychiatric: The patient has normal affect. Cardiovascular: Minimal edema bilaterally., Cluster of spider veins behind her right knee which is tender to touch  Diagnostic Studies: Duplex ultrasound was performed today. This shows bilateral deep system reflux in the tibial veins. There is a short segment of reflux within the left greater saphenous vein. There is no evidence of reflux in the right saphenous vein  Outside Studies/Documentation Historical records were reviewed.    Assessment:   bilateral venous insufficiency with symptomatic right  Plan:  the patient has no superficial system reflux on the right however, there is reflux in a posterior tibial vein. She has tried wearing compression stockings. These help with the discomfort. I feel it should potentially be a candidate for sclerotherapy to the problematic vein. She was seen also by Clementeen Hoof  today who described sclerotherapy to her. The patient will decide if she was to proceed.     Jorge Ny, M.D. [facility] Office: [phone] Pager:  [phone]

## 2012-05-22 ENCOUNTER — Encounter: Payer: Self-pay | Admitting: Internal Medicine

## 2012-05-22 ENCOUNTER — Ambulatory Visit (INDEPENDENT_AMBULATORY_CARE_PROVIDER_SITE_OTHER): Payer: Medicare Other | Admitting: Internal Medicine

## 2012-05-22 ENCOUNTER — Other Ambulatory Visit (HOSPITAL_COMMUNITY)
Admission: RE | Admit: 2012-05-22 | Discharge: 2012-05-22 | Disposition: A | Discharge status: Home / Self Care | Payer: Medicare Other | Source: Ambulatory Visit | Attending: Internal Medicine | Admitting: Internal Medicine

## 2012-05-22 VITALS — BP 124/60 | HR 79 | Temp 98.2°F | Resp 16 | Ht 67.0 in | Wt 192.0 lb

## 2012-05-22 DIAGNOSIS — Z0000 Encounter for general adult medical examination without abnormal findings: Secondary | ICD-10-CM

## 2012-05-22 DIAGNOSIS — F3289 Other specified depressive episodes: Secondary | ICD-10-CM

## 2012-05-22 DIAGNOSIS — F32A Depression, unspecified: Secondary | ICD-10-CM

## 2012-05-22 DIAGNOSIS — Z01419 Encounter for gynecological examination (general) (routine) without abnormal findings: Secondary | ICD-10-CM | POA: Insufficient documentation

## 2012-05-22 DIAGNOSIS — Z124 Encounter for screening for malignant neoplasm of cervix: Secondary | ICD-10-CM

## 2012-05-22 DIAGNOSIS — Z1151 Encounter for screening for human papillomavirus (HPV): Secondary | ICD-10-CM | POA: Insufficient documentation

## 2012-05-22 DIAGNOSIS — F411 Generalized anxiety disorder: Secondary | ICD-10-CM

## 2012-05-22 DIAGNOSIS — F329 Major depressive disorder, single episode, unspecified: Secondary | ICD-10-CM

## 2012-05-22 DIAGNOSIS — N952 Postmenopausal atrophic vaginitis: Secondary | ICD-10-CM

## 2012-05-22 MED ORDER — TRAZODONE HCL 50 MG PO TABS: 25.0000 mg | ORAL_TABLET | Freq: Every evening | ORAL | Status: DC | PRN

## 2012-05-22 MED ORDER — ALPRAZOLAM 0.5 MG PO TABS: 0.5000 mg | ORAL_TABLET | Freq: Every evening | ORAL | Status: DC | PRN

## 2012-05-22 MED ORDER — CITALOPRAM HYDROBROMIDE 10 MG PO TABS: 10.0000 mg | ORAL_TABLET | Freq: Every day | ORAL | Status: DC

## 2012-05-22 NOTE — Patient Instructions (Addendum)
Trial of trazadone  And alprazolam to help manage your depression/anxiety.  One tablet daily in the evening   Alprazolam:  Use sparingly (not more than once daily ) for extreme emotion   You can try the vagifem tablets for treatment of atrophic vaginitis and vaginal drying (samples)  One tablet inserted nightly for two weeks,  Then reduce use to twice weekly

## 2012-05-24 DIAGNOSIS — N952 Postmenopausal atrophic vaginitis: Secondary | ICD-10-CM | POA: Insufficient documentation

## 2012-05-24 DIAGNOSIS — F411 Generalized anxiety disorder: Secondary | ICD-10-CM | POA: Insufficient documentation

## 2012-05-24 DIAGNOSIS — Z0000 Encounter for general adult medical examination without abnormal findings: Secondary | ICD-10-CM | POA: Insufficient documentation

## 2012-05-24 NOTE — Assessment & Plan Note (Signed)
New onset, aggravated by financial stressors and marital discord her chief symptoms are feeling very anxious and nervous during the day and having trouble sleeping. She is requesting assistance with an with a antidepressant. She's also requesting referral for marital counseling. She is uninsured currently. Given her financial constraints I have recommended a trial of trazodone for bedtime use and alprazolam for once or twice daily use. Referral underway for psychological counseling.

## 2012-05-24 NOTE — Assessment & Plan Note (Addendum)
Annual comprehensive exam was done including breast, Pelvic and Pap smear of an irritated area on her vaginal wall.. . All screenings have been addressed .

## 2012-05-24 NOTE — Progress Notes (Signed)
Patient ID: Brenda Leon, female   DOB: 10-25-1945, 67 y.o.   MRN: [id number]  Subjective:     Brenda Leon is a 67 y.o. female and is here for an annual Medicare wellness examination and management of other chronic and acute problems.   The risk factors are reflected in the social history.  The roster of all physicians providing medical care to patient - is listed in the Snapshot section of the chart.  Activities of daily living:  The patient is 100% independent in all ADLs: dressing, toileting, feeding as well as independent mobility  Home safety : The patient has smoke detectors in the home. They wear seatbelts.  There are no firearms at home. There is no violence in the home.   There is no risks for hepatitis, STDs or HIV. There is no   history of blood transfusion. They have no travel history to infectious disease endemic areas of the world.  The patient has seen their dentist in the last six month. They have seen their eye doctor in the last year. They admit to slight hearing difficulty with regard to whispered voices and some television programs.  They have deferred audiologic testing in the last year.  They do not  have excessive sun exposure. Discussed the need for sun protection: hats, long sleeves and use of sunscreen if there is significant sun exposure.   Diet: the importance of a healthy diet is discussed. They do have a healthy diet.  The benefits of regular aerobic exercise were discussed. She walks 4 times per week ,  20 minutes.   Depression screen: there are no signs or vegative symptoms of depression- irritability, change in appetite, anhedonia, sadness/tearfullness.  Cognitive assessment: the patient manages all their financial and personal affairs and is actively engaged. They could relate day,date,year and events; recalled 2/3 objects at 3 minutes; performed clock-face test normally.  The following portions of the patient's history were reviewed and updated as  appropriate: allergies, current medications, past family history, past medical history,  past surgical history, past social history  and problem list.  Visual acuity was not assessed per patient preference since she has regular follow up with her ophthalmologist. Hearing and body mass index were assessed and reviewed.   During the course of the visit the patient was educated and counseled about appropriate screening and preventive services including : fall prevention , diabetes screening, nutrition counseling, colorectal cancer screening, and recommended immunizations.     History   Social History  . Marital Status: Married    Spouse Name: N/A    Number of Children: N/A  . Years of Education: N/A   Occupational History  . Not on file.   Social History Main Topics  . Smoking status: Former Smoker    Quit date: 02/25/1986  . Smokeless tobacco: Not on file  . Alcohol Use: 7.0 oz/week    14 drink(s) per week  . Drug Use: No  . Sexually Active: Not on file   Other Topics Concern  . Not on file   Social History Narrative  . No narrative on file   Health Maintenance  Topic Date Due  . Zostavax  09/27/2006  . Influenza Vaccine  10/26/2012  . Mammogram  01/10/2014  . Colonoscopy  02/10/2015  . Tetanus/tdap  04/12/2020  . Pneumococcal Polysaccharide Vaccine Age 54 And Over  Completed    The following portions of the patient's history were reviewed and updated as appropriate: allergies, current medications, past family history,  past medical history, past social history, past surgical history and problem list.  Review of Systems A comprehensive review of systems was negative except for: Behavioral/Psych: positive for anxiety   Objective:   BP 124/60  Pulse 79  Temp(Src) 98.2 F (36.8 C) (Oral)  Resp 16  Ht 5\' 7"  (1.702 m)  Wt 192 lb (87.091 kg)  BMI 30.06 kg/m2  SpO2 99%  General Appearance:    Alert, cooperative, no distress, appears stated age  Head:     Normocephalic, without obvious abnormality, atraumatic  Eyes:    PERRL, conjunctiva/corneas clear, EOM's intact, fundi    benign, both eyes  Ears:    Normal TM's and external ear canals, both ears  Nose:   Nares normal, septum midline, mucosa normal, no drainage    or sinus tenderness  Throat:   Lips, mucosa, and tongue normal; teeth and gums normal  Neck:   Supple, symmetrical, trachea midline, no adenopathy;    thyroid:  no enlargement/tenderness/nodules; no carotid   bruit or JVD  Back:     Symmetric, no curvature, ROM normal, no CVA tenderness  Lungs:     Clear to auscultation bilaterally, respirations unlabored  Chest Wall:    No tenderness or deformity   Heart:    Regular rate and rhythm, S1 and S2 normal, no murmur, rub   or gallop  Breast Exam:    No tenderness, masses, or nipple abnormality  Abdomen:     Soft, non-tender, bowel sounds active all four quadrants,    no masses, no organomegaly  Genitalia:    Normal female without lesion, discharge or tenderness  Extremities:   Extremities normal, atraumatic, no cyanosis or edema  Pulses:   2+ and symmetric all extremities  Skin:   Skin color, texture, turgor normal, no rashes or lesions  Lymph nodes:   Cervical, supraclavicular, and axillary nodes normal  Neurologic:   CNII-XII intact, normal strength, sensation and reflexes    throughout    Assessment:   Postmenopausal atrophic vaginitis Resulting in loss of libido and no interest in sexual activity with her husband. Trial of Vagifem tablets. Samples for 30 days given to patient. She is status post hysterectomy.  Generalized anxiety disorder New onset, aggravated by financial stressors and marital discord her chief symptoms are feeling very anxious and nervous during the day and having trouble sleeping. She is requesting assistance with an with a antidepressant. She's also requesting referral for marital counseling. She is uninsured currently. Given her financial constraints I  have recommended a trial of trazodone for bedtime use and alprazolam for once or twice daily use. Referral underway for psychological counseling.   Routine general medical examination at a health care facility Annual comprehensive exam was done including breast, Pelvic and Pap smear of an irritated area on her vaginal wall.. . All screenings have been addressed .    Updated Medication List Outpatient Encounter Prescriptions as of 05/22/2012  Medication Sig Dispense Refill  . Ascorbic Acid (VITAMIN C) 100 MG tablet Take 100 mg by mouth daily.      Marland Kitchen b complex vitamins tablet Take 1 tablet by mouth daily.      . Biotin 1000 MCG tablet Take 1,000 mcg by mouth 3 (three) times daily.      . Coenzyme Q10 (COQ-10) 100 MG CAPS Take 1 tablet by mouth daily.      . fish oil-omega-3 fatty acids 1000 MG capsule Take 2 g by mouth daily.      Marland Kitchen  Flaxseed, Linseed, (FLAX SEED OIL PO) Take by mouth.      . gabapentin (NEURONTIN) 100 MG capsule Take 1-2 tablets nightly as needed for nerve pain.      . Ginkgo Biloba 40 MG TABS Take by mouth daily.      Marland Kitchen lisinopril (PRINIVIL,ZESTRIL) 30 MG tablet Take 1 tablet (30 mg total) by mouth daily.  90 tablet  0  . naproxen (NAPROSYN) 250 MG tablet Take 250 mg by mouth 2 (two) times daily with a meal.      . omeprazole (PRILOSEC) 10 MG capsule Take 20 mg by mouth daily.       . Red Yeast Rice Extract 600 MG CAPS Take 1 capsule (600 mg total) by mouth daily.  60 capsule  0  . terbinafine (LAMISIL) 250 MG tablet Take 250 mg by mouth daily.      . vitamin E 100 UNIT capsule Take 1,000 Units by mouth daily.       Marland Kitchen ALPRAZolam (XANAX) 0.5 MG tablet Take 1 tablet (0.5 mg total) by mouth at bedtime as needed for sleep.  30 tablet  3  . traZODone (DESYREL) 50 MG tablet Take 0.5-1 tablets (25-50 mg total) by mouth at bedtime as needed for sleep.  30 tablet  3  . [DISCONTINUED] citalopram (CELEXA) 10 MG tablet Take 1 tablet (10 mg total) by mouth daily.  30 tablet  2   No  facility-administered encounter medications on file as of 05/22/2012.

## 2012-05-24 NOTE — Assessment & Plan Note (Signed)
Resulting in loss of libido and no interest in sexual activity with her husband. Trial of Vagifem tablets. Samples for 30 days given to patient. She is status post hysterectomy.

## 2012-05-29 NOTE — Progress Notes (Signed)
Pt.notified

## 2012-06-01 ENCOUNTER — Other Ambulatory Visit: Payer: Self-pay | Admitting: Internal Medicine

## 2012-06-02 LAB — HM PAP SMEAR: HM Pap smear: NEGATIVE

## 2012-06-08 ENCOUNTER — Telehealth: Payer: Self-pay | Admitting: Internal Medicine

## 2012-06-08 ENCOUNTER — Encounter: Payer: Self-pay | Admitting: Internal Medicine

## 2012-06-08 NOTE — Telephone Encounter (Signed)
Notified patient left message per DPR on cell phone.

## 2012-06-08 NOTE — Telephone Encounter (Signed)
Her insurance has denied coverage of Silver Sneakers. She has the right to appeal within 60 days

## 2012-08-13 ENCOUNTER — Encounter: Payer: Self-pay | Admitting: Internal Medicine

## 2012-09-24 ENCOUNTER — Encounter: Payer: Self-pay | Admitting: Internal Medicine

## 2012-12-09 ENCOUNTER — Other Ambulatory Visit: Payer: Self-pay | Admitting: Internal Medicine

## 2012-12-15 NOTE — Telephone Encounter (Signed)
Sent 06/23/12

## 2012-12-25 ENCOUNTER — Encounter: Payer: Self-pay | Admitting: Internal Medicine

## 2012-12-29 ENCOUNTER — Encounter: Payer: Self-pay | Admitting: *Deleted

## 2012-12-29 ENCOUNTER — Telehealth: Payer: Self-pay | Admitting: Internal Medicine

## 2012-12-29 NOTE — Telephone Encounter (Signed)
See below my chart message  Appointment Request From: Brenda Leon      With Provider: Duncan Dull, MD [-Primary Care Physician-]      Preferred Date Range: Any date 01/12/2013 or later      Preferred Times: Tuesday Afternoon      Reason: To address the following health maintenance concerns.   Zostavax   Influenza Vaccine      Comments:   Is it okay to have the shingles vaccine and the flu shot at the same time? Any downside to doing this?   If there is reason for concern, please schedule one or the other on Nov 25th, afternoon.   thank you

## 2012-12-29 NOTE — Telephone Encounter (Signed)
See my Chart message.

## 2012-12-31 ENCOUNTER — Other Ambulatory Visit: Payer: Self-pay

## 2013-01-15 ENCOUNTER — Ambulatory Visit (INDEPENDENT_AMBULATORY_CARE_PROVIDER_SITE_OTHER): Payer: Medicare Other | Admitting: *Deleted

## 2013-01-15 DIAGNOSIS — Z2911 Encounter for prophylactic immunotherapy for respiratory syncytial virus (RSV): Secondary | ICD-10-CM

## 2013-01-15 DIAGNOSIS — Z23 Encounter for immunization: Secondary | ICD-10-CM

## 2013-02-03 ENCOUNTER — Other Ambulatory Visit: Payer: Self-pay | Admitting: Internal Medicine

## 2013-02-03 NOTE — Telephone Encounter (Signed)
Last appt 05/22/12, refill?

## 2013-02-03 NOTE — Telephone Encounter (Signed)
Rx faxed

## 2013-02-16 ENCOUNTER — Encounter: Payer: Medicare Other | Admitting: Internal Medicine

## 2013-02-23 ENCOUNTER — Ambulatory Visit (INDEPENDENT_AMBULATORY_CARE_PROVIDER_SITE_OTHER): Payer: Medicare Other

## 2013-02-23 DIAGNOSIS — Z23 Encounter for immunization: Secondary | ICD-10-CM

## 2013-05-10 ENCOUNTER — Other Ambulatory Visit: Payer: Self-pay | Admitting: Internal Medicine

## 2013-05-10 NOTE — Telephone Encounter (Signed)
30 day refill only,  Needs CMET and fasting lipids prior to any more refills  Refill one 30 days only.  Has not been seen in one year so he needs a  30 minute visit.

## 2013-05-10 NOTE — Telephone Encounter (Signed)
Please advise Okay to fill last OV 3/14

## 2013-06-08 ENCOUNTER — Ambulatory Visit (INDEPENDENT_AMBULATORY_CARE_PROVIDER_SITE_OTHER): Payer: 59 | Admitting: Psychology

## 2013-06-08 DIAGNOSIS — F4323 Adjustment disorder with mixed anxiety and depressed mood: Secondary | ICD-10-CM

## 2013-06-23 ENCOUNTER — Ambulatory Visit (INDEPENDENT_AMBULATORY_CARE_PROVIDER_SITE_OTHER): Payer: 59 | Admitting: Psychology

## 2013-06-23 DIAGNOSIS — F4323 Adjustment disorder with mixed anxiety and depressed mood: Secondary | ICD-10-CM

## 2013-06-24 ENCOUNTER — Telehealth: Payer: Self-pay | Admitting: *Deleted

## 2013-06-24 DIAGNOSIS — Z79899 Other long term (current) drug therapy: Secondary | ICD-10-CM

## 2013-06-24 DIAGNOSIS — E559 Vitamin D deficiency, unspecified: Secondary | ICD-10-CM

## 2013-06-24 DIAGNOSIS — R5383 Other fatigue: Principal | ICD-10-CM

## 2013-06-24 DIAGNOSIS — E785 Hyperlipidemia, unspecified: Secondary | ICD-10-CM

## 2013-06-24 DIAGNOSIS — R5381 Other malaise: Secondary | ICD-10-CM

## 2013-06-24 NOTE — Telephone Encounter (Signed)
Pt is coming in tomorrow what labs and dx?  

## 2013-06-25 ENCOUNTER — Other Ambulatory Visit (INDEPENDENT_AMBULATORY_CARE_PROVIDER_SITE_OTHER): Payer: Medicare Other

## 2013-06-25 DIAGNOSIS — R5383 Other fatigue: Secondary | ICD-10-CM

## 2013-06-25 DIAGNOSIS — R5381 Other malaise: Secondary | ICD-10-CM

## 2013-06-25 DIAGNOSIS — Z79899 Other long term (current) drug therapy: Secondary | ICD-10-CM

## 2013-06-25 DIAGNOSIS — E559 Vitamin D deficiency, unspecified: Secondary | ICD-10-CM

## 2013-06-25 DIAGNOSIS — E785 Hyperlipidemia, unspecified: Secondary | ICD-10-CM

## 2013-06-25 LAB — COMPREHENSIVE METABOLIC PANEL
ALK PHOS: 48 U/L (ref 39–117)
ALT: 20 U/L (ref 0–35)
AST: 21 U/L (ref 0–37)
Albumin: 4 g/dL (ref 3.5–5.2)
BILIRUBIN TOTAL: 0.5 mg/dL (ref 0.3–1.2)
BUN: 26 mg/dL — ABNORMAL HIGH (ref 6–23)
CO2: 29 mEq/L (ref 19–32)
Calcium: 9.5 mg/dL (ref 8.4–10.5)
Chloride: 107 mEq/L (ref 96–112)
Creatinine, Ser: 0.5 mg/dL (ref 0.4–1.2)
GFR: 119.78 mL/min (ref >60.00)
Glucose, Bld: 100 mg/dL — ABNORMAL HIGH (ref 70–99)
Potassium: 4.9 mEq/L (ref 3.5–5.1)
SODIUM: 144 meq/L (ref 135–145)
Total Protein: 7.1 g/dL (ref 6.0–8.3)

## 2013-06-25 LAB — LIPID PANEL
CHOLESTEROL: 214 mg/dL — AB (ref 0–200)
HDL: 45.8 mg/dL (ref >39.00)
LDL CALC: 151 mg/dL — AB (ref 0–99)
TRIGLYCERIDES: 87 mg/dL (ref 0.0–149.0)
Total CHOL/HDL Ratio: 5
VLDL: 17.4 mg/dL (ref 0.0–40.0)

## 2013-06-25 LAB — TSH: TSH: 1.67 u[IU]/mL (ref 0.35–5.50)

## 2013-06-26 ENCOUNTER — Encounter: Payer: Self-pay | Admitting: Internal Medicine

## 2013-06-26 LAB — VITAMIN D 25 HYDROXY (VIT D DEFICIENCY, FRACTURES): Vit D, 25-Hydroxy: 42 ng/mL (ref 30–89)

## 2013-07-02 ENCOUNTER — Ambulatory Visit (INDEPENDENT_AMBULATORY_CARE_PROVIDER_SITE_OTHER): Payer: Medicare Other | Admitting: Internal Medicine

## 2013-07-02 ENCOUNTER — Encounter: Payer: Self-pay | Admitting: Internal Medicine

## 2013-07-02 VITALS — BP 132/72 | HR 69 | Temp 98.5°F | Resp 16 | Wt 191.5 lb

## 2013-07-02 DIAGNOSIS — I83813 Varicose veins of bilateral lower extremities with pain: Secondary | ICD-10-CM

## 2013-07-02 DIAGNOSIS — I83893 Varicose veins of bilateral lower extremities with other complications: Secondary | ICD-10-CM

## 2013-07-02 DIAGNOSIS — I10 Essential (primary) hypertension: Secondary | ICD-10-CM

## 2013-07-02 DIAGNOSIS — K219 Gastro-esophageal reflux disease without esophagitis: Secondary | ICD-10-CM

## 2013-07-02 DIAGNOSIS — E785 Hyperlipidemia, unspecified: Secondary | ICD-10-CM

## 2013-07-02 MED ORDER — LISINOPRIL 30 MG PO TABS: ORAL_TABLET | ORAL | Status: DC

## 2013-07-02 NOTE — Patient Instructions (Addendum)
Increase your red yeast rice to 1200 mg daily  I recommend drinking  8 ounces of unsweetened almond/coconut milk daily for calcium,  D and E   Also consider  Using mini sweet peppers to get your Vitamin C   [place] with mushrooms and steamed with chicken broth is very very low carb and high nutrition    Horse chestnut seed extract 300 mg daily for leg veins    Return in 6 months for your annual physical

## 2013-07-02 NOTE — Assessment & Plan Note (Addendum)
Insurance denied coverage of sclerotherapy.  wearing compression stockings.. trial of horse chestnut seed extract. recommended 300 mg daily

## 2013-07-02 NOTE — Assessment & Plan Note (Signed)
rec inctreasing re yeast rice to 1200 mg daiy ,  Patient does NOT WANT TO use statins

## 2013-07-02 NOTE — Progress Notes (Signed)
Patient ID: Brenda Leon, female   DOB: 1946/01/07, 68 y.o.   MRN: [id number]  Patient Active Problem List   Diagnosis Date Noted  . Varicose veins of both lower extremities with pain 07/02/2013  . Postmenopausal atrophic vaginitis 05/24/2012  . Generalized anxiety disorder 05/24/2012  . Routine general medical examination at a health care facility 05/24/2012  . Pain in limb 05/18/2012  . Essential hypertension, benign 04/29/2012  . Leg pain, diffuse 02/10/2012  . SLE (systemic lupus erythematosus) 02/10/2012  . GERD (gastroesophageal reflux disease) 02/10/2012  . Lumbago with sciatica 02/10/2012  . Hyperlipidemia LDL goal < 130 02/10/2012    Subjective:  CC:   Chief Complaint  Patient presents with  . Follow-up    medication refills    HPI:   Brenda Leon is a 68 y.o. female who presents for  One year follow up on chronic conditions including hypertension,  GAD. GERD and hyperlipidemia.  She is tolerating her medications without side effects. Her varicose veins bothering her on the right calf despite using wearing compression stockings, knee high,  But not daily      Past Medical History  Diagnosis Date  . Cataracts, bilateral   . Thyroid disease   . Arthritis   . Chicken pox   . Diverticulitis   . GERD (gastroesophageal reflux disease)   . Elevated blood pressure reading   . Lupus   . SLE (systemic lupus erythematosus)     diagnosed with skin biopsy and serology  . Anemia     Past Surgical History  Procedure Laterality Date  . Tonsillectomy and adenoidectomy    . Knee arthroscopy w/ acl reconstruction    . Appendectomy  1952  . Eye surgery  1995    x2 1995 and 1996  . Breast biopsy  2004  . Cataract extraction, bilateral Bilateral 1995, 1996  . Abdominal hysterectomy  1987       The following portions of the patient's history were reviewed and updated as appropriate: Allergies, current medications, and problem list.    Review of Systems:   Patient denies headache, fevers, malaise, unintentional weight loss, skin rash, eye pain, sinus congestion and sinus pain, sore throat, dysphagia,  hemoptysis , cough, dyspnea, wheezing, chest pain, palpitations, orthopnea, edema, abdominal pain, nausea, melena, diarrhea, constipation, flank pain, dysuria, hematuria, urinary  Frequency, nocturia, numbness, tingling, seizures,  Focal weakness, Loss of consciousness,  Tremor, insomnia, depression, anxiety, and suicidal ideation.     History   Social History  . Marital Status: Married    Spouse Name: N/A    Number of Children: N/A  . Years of Education: N/A   Occupational History  . Not on file.   Social History Main Topics  . Smoking status: Former Smoker    Quit date: 02/25/1986  . Smokeless tobacco: Not on file  . Alcohol Use: 7.0 oz/week    14 drink(s) per week  . Drug Use: No  . Sexual Activity: Not on file   Other Topics Concern  . Not on file   Social History Narrative  . No narrative on file    Objective:  Filed Vitals:   07/02/13 1005  BP: 132/72  Pulse: 69  Temp: 98.5 F (36.9 C)  Resp: 16     General appearance: alert, cooperative and appears stated age Ears: normal TM's and external ear canals both ears Throat: lips, mucosa, and tongue normal; teeth and gums normal Neck: no adenopathy, no carotid bruit, supple, symmetrical, trachea midline and  thyroid not enlarged, symmetric, no tenderness/mass/nodules Back: symmetric, no curvature. ROM normal. No CVA tenderness. Lungs: clear to auscultation bilaterally Heart: regular rate and rhythm, S1, S2 normal, no murmur, click, rub or gallop Abdomen: soft, non-tender; bowel sounds normal; no masses,  no organomegaly Pulses: 2+ and symmetric Skin: Skin color, texture, turgor normal. No rashes or lesions Lymph nodes: Cervical, supraclavicular, and axillary nodes normal.  Assessment and Plan:  Hyperlipidemia LDL goal < 130 rec inctreasing re yeast rice to 1200 mg  daiy ,  Patient does NOT WANT TO use statins   Essential hypertension, benign Well controlled on current regimen. Renal function stable, no changes today.  Varicose veins of both lower extremities with pain Insurance denied coverage of sclerotherapy.  wearing compression stockings.. trial of horse chestnut seed extract. recommended 300 mg daily  GERD (gastroesophageal reflux disease) Daily symptoms managed with PI   Updated Medication List Outpatient Encounter Prescriptions as of 07/02/2013  Medication Sig  . Ascorbic Acid (VITAMIN C) 100 MG tablet Take 100 mg by mouth daily.  Marland Kitchen b complex vitamins tablet Take 1 tablet by mouth daily.  . calcium carbonate (OS-CAL) 600 MG TABS tablet Take 600 mg by mouth daily with breakfast.  . Flaxseed, Linseed, (FLAX SEED OIL PO) Take by mouth.  . gabapentin (NEURONTIN) 100 MG capsule Take 1-2 tablets nightly as needed for nerve pain.  . Ginkgo Biloba 40 MG TABS Take 120 mg by mouth daily.   Marland Kitchen lisinopril (PRINIVIL,ZESTRIL) 30 MG tablet TAKE ONE TABLET BY MOUTH ONCE DAILY  . Melatonin 3 MG CAPS Take 2 capsules by mouth at bedtime.  . naproxen (NAPROSYN) 250 MG tablet Take 220 mg by mouth 2 (two) times daily with a meal.   . omeprazole (PRILOSEC) 10 MG capsule Take 20 mg by mouth daily.   . Red Yeast Rice Extract 600 MG CAPS Take 1 capsule (600 mg total) by mouth daily.  . vitamin E 100 UNIT capsule Take 1,000 Units by mouth daily.   . [DISCONTINUED] lisinopril (PRINIVIL,ZESTRIL) 30 MG tablet TAKE ONE TABLET BY MOUTH ONCE DAILY  . [DISCONTINUED] ALPRAZolam (XANAX) 0.5 MG tablet TAKE ONE TABLET BY MOUTH AT BEDTIME AS NEEDED FOR SLEEP  . [DISCONTINUED] Biotin 1000 MCG tablet Take 1,000 mcg by mouth 3 (three) times daily.  . [DISCONTINUED] Coenzyme Q10 (COQ-10) 100 MG CAPS Take 1 tablet by mouth daily.  . [DISCONTINUED] fish oil-omega-3 fatty acids 1000 MG capsule Take 2 g by mouth daily.  . [DISCONTINUED] terbinafine (LAMISIL) 250 MG tablet Take 250 mg by  mouth daily.  . [DISCONTINUED] traZODone (DESYREL) 50 MG tablet Take 0.5-1 tablets (25-50 mg total) by mouth at bedtime as needed for sleep.     Orders Placed This Encounter  Procedures  . HM PAP SMEAR    No Follow-up on file.

## 2013-07-02 NOTE — Assessment & Plan Note (Signed)
Well controlled on current regimen. Renal function stable, no changes today. 

## 2013-07-03 ENCOUNTER — Telehealth: Payer: Self-pay | Admitting: Internal Medicine

## 2013-07-03 NOTE — Telephone Encounter (Signed)
Relevant patient education assigned to patient using Emmi. ° °

## 2013-07-04 ENCOUNTER — Encounter: Payer: Self-pay | Admitting: Internal Medicine

## 2013-07-04 NOTE — Assessment & Plan Note (Signed)
Daily symptoms managed with PI

## 2013-07-07 ENCOUNTER — Ambulatory Visit (INDEPENDENT_AMBULATORY_CARE_PROVIDER_SITE_OTHER): Payer: 59 | Admitting: Psychology

## 2013-07-07 DIAGNOSIS — F4323 Adjustment disorder with mixed anxiety and depressed mood: Secondary | ICD-10-CM

## 2013-07-11 ENCOUNTER — Encounter: Payer: Self-pay | Admitting: Internal Medicine

## 2013-07-13 ENCOUNTER — Other Ambulatory Visit: Payer: Self-pay | Admitting: Internal Medicine

## 2013-07-13 DIAGNOSIS — Z1231 Encounter for screening mammogram for malignant neoplasm of breast: Secondary | ICD-10-CM

## 2013-07-28 ENCOUNTER — Ambulatory Visit (INDEPENDENT_AMBULATORY_CARE_PROVIDER_SITE_OTHER): Payer: 59 | Admitting: Psychology

## 2013-07-28 DIAGNOSIS — F4323 Adjustment disorder with mixed anxiety and depressed mood: Secondary | ICD-10-CM

## 2013-08-06 ENCOUNTER — Ambulatory Visit (HOSPITAL_COMMUNITY)
Admission: RE | Admit: 2013-08-06 | Discharge: 2013-08-06 | Disposition: A | Discharge status: Home / Self Care | Payer: Medicare Other | Source: Ambulatory Visit | Attending: Internal Medicine | Admitting: Internal Medicine

## 2013-08-06 DIAGNOSIS — Z1231 Encounter for screening mammogram for malignant neoplasm of breast: Secondary | ICD-10-CM

## 2013-08-11 ENCOUNTER — Ambulatory Visit (INDEPENDENT_AMBULATORY_CARE_PROVIDER_SITE_OTHER): Payer: 59 | Admitting: Psychology

## 2013-08-11 DIAGNOSIS — F4323 Adjustment disorder with mixed anxiety and depressed mood: Secondary | ICD-10-CM

## 2013-08-15 ENCOUNTER — Encounter: Payer: Self-pay | Admitting: Internal Medicine

## 2013-08-19 ENCOUNTER — Telehealth: Payer: Self-pay | Admitting: Internal Medicine

## 2013-09-08 ENCOUNTER — Ambulatory Visit (INDEPENDENT_AMBULATORY_CARE_PROVIDER_SITE_OTHER): Payer: 59 | Admitting: Psychology

## 2013-09-08 DIAGNOSIS — F4323 Adjustment disorder with mixed anxiety and depressed mood: Secondary | ICD-10-CM

## 2013-09-15 ENCOUNTER — Ambulatory Visit (INDEPENDENT_AMBULATORY_CARE_PROVIDER_SITE_OTHER): Payer: 59 | Admitting: Psychology

## 2013-09-15 DIAGNOSIS — F4323 Adjustment disorder with mixed anxiety and depressed mood: Secondary | ICD-10-CM

## 2013-09-29 ENCOUNTER — Ambulatory Visit (INDEPENDENT_AMBULATORY_CARE_PROVIDER_SITE_OTHER): Payer: 59 | Admitting: Psychology

## 2013-09-29 DIAGNOSIS — F4323 Adjustment disorder with mixed anxiety and depressed mood: Secondary | ICD-10-CM

## 2013-10-20 ENCOUNTER — Ambulatory Visit (INDEPENDENT_AMBULATORY_CARE_PROVIDER_SITE_OTHER): Payer: 59 | Admitting: Psychology

## 2013-10-20 DIAGNOSIS — F4323 Adjustment disorder with mixed anxiety and depressed mood: Secondary | ICD-10-CM

## 2013-12-16 ENCOUNTER — Encounter: Payer: Self-pay | Admitting: Internal Medicine

## 2013-12-28 ENCOUNTER — Telehealth: Payer: Self-pay | Admitting: Internal Medicine

## 2013-12-28 NOTE — Telephone Encounter (Signed)
I cannot treat without seeing.  She Needs appt ,  Can overbook tomorrow we will work in, in the morning

## 2013-12-28 NOTE — Telephone Encounter (Signed)
Patient has rash on left side of ribcage that flanks around to abdomen. Patient has been using Calamine lotion and Benadryl cream mixed to area and taking benadryl by mouth, rash is warm to touch stated by patient  And has been present for X 10 days Please advise.

## 2013-12-28 NOTE — Telephone Encounter (Signed)
Scheduled patient 12/29/13 @ 10:15, pt aware.

## 2013-12-29 ENCOUNTER — Ambulatory Visit: Payer: 59 | Admitting: Psychology

## 2013-12-29 ENCOUNTER — Encounter: Payer: Self-pay | Admitting: Internal Medicine

## 2013-12-29 ENCOUNTER — Ambulatory Visit (INDEPENDENT_AMBULATORY_CARE_PROVIDER_SITE_OTHER): Payer: Medicare Other | Admitting: Internal Medicine

## 2013-12-29 VITALS — BP 118/58 | HR 83 | Temp 98.3°F | Resp 16 | Ht 67.0 in | Wt 196.0 lb

## 2013-12-29 DIAGNOSIS — B372 Candidiasis of skin and nail: Secondary | ICD-10-CM

## 2013-12-29 MED ORDER — NYSTATIN-TRIAMCINOLONE 100000-0.1 UNIT/GM-% EX OINT: 1.0000 "application " | TOPICAL_OINTMENT | Freq: Two times a day (BID) | CUTANEOUS | Status: DC

## 2013-12-29 NOTE — Patient Instructions (Signed)
Your rash may be due to a fungal infection  Apply the Mycolog ointment twice daily to area  Rash should resolve by the end of two weeks

## 2013-12-29 NOTE — Progress Notes (Signed)
Pre-visit discussion using our clinic review tool. No additional management support is needed unless otherwise documented below in the visit note.  

## 2013-12-29 NOTE — Progress Notes (Signed)
Patient ID: Brenda Leon, female   DOB: 1946-01-13, 68 y.o.   MRN: [id number]    Patient Active Problem List   Diagnosis Date Noted  . Candidal dermatitis 01/01/2014  . Varicose veins of both lower extremities with pain 07/02/2013  . Postmenopausal atrophic vaginitis 05/24/2012  . Generalized anxiety disorder 05/24/2012  . Routine general medical examination at a health care facility 05/24/2012  . Pain in limb 05/18/2012  . Essential hypertension, benign 04/29/2012  . Leg pain, diffuse 02/10/2012  . SLE (systemic lupus erythematosus) 02/10/2012  . GERD (gastroesophageal reflux disease) 02/10/2012  . Lumbago with sciatica 02/10/2012  . Hyperlipidemia LDL goal < 130 02/10/2012    Subjective:  CC:   Chief Complaint  Patient presents with  . Rash    Left side on left side going to back. Itches  bright red in color.    HPI:   Brenda Leon is a 68 y.o. female who presents for  pruritic skin rash involving thorax and abdomen , started 10 daysa ago, under left breast.  Only new contact or activity was use of a sealant to weather proof her patio wood. Has a lot of skin sensitivities.  Has been applying 1% topical hydrocortisone with no improvement.  Has developed a few satellite papules since then on posterior thorax.    Past Medical History  Diagnosis Date  . Cataracts, bilateral   . Thyroid disease   . Arthritis   . Chicken pox   . Diverticulitis   . GERD (gastroesophageal reflux disease)   . Elevated blood pressure reading   . Lupus   . SLE (systemic lupus erythematosus)     diagnosed with skin biopsy and serology  . Anemia     Past Surgical History  Procedure Laterality Date  . Tonsillectomy and adenoidectomy    . Knee arthroscopy w/ acl reconstruction    . Appendectomy  1952  . Eye surgery  1995    x2 1995 and 1996  . Breast biopsy  2004  . Cataract extraction, bilateral Bilateral 1995, 1996  . Abdominal hysterectomy  1987       The following portions of  the patient's history were reviewed and updated as appropriate: Allergies, current medications, and problem list.    Review of Systems:   Patient denies headache, fevers, malaise, unintentional weight loss, skin rash, eye pain, sinus congestion and sinus pain, sore throat, dysphagia,  hemoptysis , cough, dyspnea, wheezing, chest pain, palpitations, orthopnea, edema, abdominal pain, nausea, melena, diarrhea, constipation, flank pain, dysuria, hematuria, urinary  Frequency, nocturia, numbness, tingling, seizures,  Focal weakness, Loss of consciousness,  Tremor, insomnia, depression, anxiety, and suicidal ideation.     History   Social History  . Marital Status: Married    Spouse Name: N/A    Number of Children: N/A  . Years of Education: N/A   Occupational History  . Not on file.   Social History Main Topics  . Smoking status: Former Smoker    Quit date: 02/25/1986  . Smokeless tobacco: Not on file  . Alcohol Use: 7.0 oz/week    14 drink(s) per week  . Drug Use: No  . Sexual Activity: Not on file   Other Topics Concern  . Not on file   Social History Narrative    Objective:  Filed Vitals:   12/29/13 1031  BP: 118/58  Pulse: 83  Temp: 98.3 F (36.8 C)  Resp: 16     General appearance: alert, cooperative and appears stated age Ears:  normal TM's and external ear canals both ears Throat: lips, mucosa, and tongue normal; teeth and gums normal Neck: no adenopathy, no carotid bruit, supple, symmetrical, trachea midline and thyroid not enlarged, symmetric, no tenderness/mass/nodules Back: symmetric, no curvature. ROM normal. No CVA tenderness. Lungs: clear to auscultation bilaterally Heart: regular rate and Brenda, S1, S2 normal, no murmur, click, rub or gallop Abdomen: soft, non-tender; bowel sounds normal; no masses,  no organomegaly Pulses: 2+ and symmetric Skin:  Erythematous well defined blanching rash, with satellite lesions on thorax/upper abdomen   Lymph nodes:  Cervical, supraclavicular, and axillary nodes normal.  Assessment and Plan:  Candidal dermatitis Nystatin/triamcinolone bid x 2 weeks    Updated Medication List Outpatient Encounter Prescriptions as of 12/29/2013  Medication Sig  . b complex vitamins tablet Take 1 tablet by mouth daily.  . Doxylamine Succinate, Sleep, (SLEEP AID PO) Take 50 mg by mouth at bedtime.  . Flaxseed, Linseed, (FLAX SEED OIL PO) Take by mouth.  . Ginkgo Biloba 40 MG TABS Take 120 mg by mouth daily.   . Horse Chestnut 300 MG CAPS Take 1 capsule by mouth daily.  Marland Kitchen lisinopril (PRINIVIL,ZESTRIL) 30 MG tablet TAKE ONE TABLET BY MOUTH ONCE DAILY  . naproxen (NAPROSYN) 250 MG tablet Take 220 mg by mouth 2 (two) times daily with a meal.   . Nutritional Supplements (ESTROVEN MAXIMUM STRENGTH PO) Take 1 capsule by mouth daily.  Marland Kitchen omeprazole (PRILOSEC) 10 MG capsule Take 20 mg by mouth daily.   . Red Yeast Rice Extract 600 MG CAPS Take 1 capsule (600 mg total) by mouth daily. (Patient taking differently: Take 1 capsule by mouth 2 (two) times daily. )  . Specialty Vitamins Products (WEIGHT LOSS DAILY MULTI) TABS Take 2 tablets by mouth daily. 15 day weight loss supplement.  . Ascorbic Acid (VITAMIN C) 100 MG tablet Take 100 mg by mouth daily.  . calcium carbonate (OS-CAL) 600 MG TABS tablet Take 600 mg by mouth daily with breakfast.  . gabapentin (NEURONTIN) 100 MG capsule Take 1-2 tablets nightly as needed for nerve pain.  . Melatonin 3 MG CAPS Take 2 capsules by mouth at bedtime.  Marland Kitchen nystatin-triamcinolone ointment (MYCOLOG) Apply 1 application topically 2 (two) times daily.  . vitamin E 100 UNIT capsule Take 1,000 Units by mouth daily.      No orders of the defined types were placed in this encounter.    No Follow-up on file.

## 2013-12-30 ENCOUNTER — Encounter: Payer: Self-pay | Admitting: Internal Medicine

## 2014-01-01 DIAGNOSIS — B372 Candidiasis of skin and nail: Secondary | ICD-10-CM | POA: Insufficient documentation

## 2014-01-01 NOTE — Assessment & Plan Note (Signed)
Nystatin/triamcinolone bid x 2 weeks

## 2014-01-05 ENCOUNTER — Encounter: Payer: Self-pay | Admitting: Internal Medicine

## 2014-01-05 ENCOUNTER — Ambulatory Visit (INDEPENDENT_AMBULATORY_CARE_PROVIDER_SITE_OTHER): Payer: Medicare Other | Admitting: Internal Medicine

## 2014-01-05 ENCOUNTER — Ambulatory Visit (INDEPENDENT_AMBULATORY_CARE_PROVIDER_SITE_OTHER): Payer: Medicare Other | Admitting: *Deleted

## 2014-01-05 VITALS — BP 106/60 | HR 83 | Temp 98.7°F | Resp 16 | Ht 67.0 in | Wt 194.8 lb

## 2014-01-05 DIAGNOSIS — F411 Generalized anxiety disorder: Secondary | ICD-10-CM

## 2014-01-05 DIAGNOSIS — E669 Obesity, unspecified: Secondary | ICD-10-CM

## 2014-01-05 DIAGNOSIS — Z0000 Encounter for general adult medical examination without abnormal findings: Secondary | ICD-10-CM

## 2014-01-05 DIAGNOSIS — Z23 Encounter for immunization: Secondary | ICD-10-CM

## 2014-01-05 DIAGNOSIS — I10 Essential (primary) hypertension: Secondary | ICD-10-CM

## 2014-01-05 DIAGNOSIS — E785 Hyperlipidemia, unspecified: Secondary | ICD-10-CM

## 2014-01-05 DIAGNOSIS — R5383 Other fatigue: Secondary | ICD-10-CM

## 2014-01-05 DIAGNOSIS — B372 Candidiasis of skin and nail: Secondary | ICD-10-CM

## 2014-01-05 NOTE — Patient Instructions (Signed)
You had your annual  wellness exam today.  We will schedule your mammogram at [facility] in December .   You received the influenza  vaccine today.  If you develop a sore arm , use tylenol and ibuprofen for a few days   Please make an appt for fasting labs at your earliest convenience.   We will contact you with the bloodwork results  Health Maintenance Adopting a healthy lifestyle and getting preventive care can go a long way to promote health and wellness. Talk with your health care provider about what schedule of regular examinations is right for you. This is a good chance for you to check in with your provider about disease prevention and staying healthy. In between checkups, there are plenty of things you can do on your own. Experts have done a lot of research about which lifestyle changes and preventive measures are most likely to keep you healthy. Ask your health care provider for more information. WEIGHT AND DIET  Eat a healthy diet  Be sure to include plenty of vegetables, fruits, low-fat dairy products, and lean protein.  Do not eat a lot of foods high in solid fats, added sugars, or salt.  Get regular exercise. This is one of the most important things you can do for your health.  Most adults should exercise for at least 150 minutes each week. The exercise should increase your heart rate and make you sweat (moderate-intensity exercise).  Most adults should also do strengthening exercises at least twice a week. This is in addition to the moderate-intensity exercise.  Maintain a healthy weight  Body mass index (BMI) is a measurement that can be used to identify possible weight problems. It estimates body fat based on height and weight. Your health care provider can help determine your BMI and help you achieve or maintain a healthy weight.  For females 77 years of age and older:   A BMI below 18.5 is considered underweight.  A BMI of 18.5 to 24.9 is normal.  A BMI of 25 to 29.9 is  considered overweight.  A BMI of 30 and above is considered obese.  Watch levels of cholesterol and blood lipids  You should start having your blood tested for lipids and cholesterol at 67 years of age, then have this test every 5 years.  You may need to have your cholesterol levels checked more often if:  Your lipid or cholesterol levels are high.  You are older than 67 years of age.  You are at high risk for heart disease.  CANCER SCREENING   Lung Cancer  Lung cancer screening is recommended for adults 40-50 years old who are at high risk for lung cancer because of a history of smoking.  A yearly low-dose CT scan of the lungs is recommended for people who:  Currently smoke.  Have quit within the past 15 years.  Have at least a 30-pack-year history of smoking. A pack year is smoking an average of one pack of cigarettes a day for 1 year.  Yearly screening should continue until it has been 15 years since you quit.  Yearly screening should stop if you develop a health problem that would prevent you from having lung cancer treatment.  Breast Cancer  Practice breast self-awareness. This means understanding how your breasts normally appear and feel.  It also means doing regular breast self-exams. Let your health care provider know about any changes, no matter how small.  If you are in your 74s or  26s, you should have a clinical breast exam (CBE) by a health care provider every 1-3 years as part of a regular health exam.  If you are 73 or older, have a CBE every year. Also consider having a breast X-ray (mammogram) every year.  If you have a family history of breast cancer, talk to your health care provider about genetic screening.  If you are at high risk for breast cancer, talk to your health care provider about having an MRI and a mammogram every year.  Breast cancer gene (BRCA) assessment is recommended for women who have family members with BRCA-related cancers.  BRCA-related cancers include:  Breast.  Ovarian.  Tubal.  Peritoneal cancers.  Results of the assessment will determine the need for genetic counseling and BRCA1 and BRCA2 testing. Cervical Cancer Routine pelvic examinations to screen for cervical cancer are no longer recommended for nonpregnant women who are considered low risk for cancer of the pelvic organs (ovaries, uterus, and vagina) and who do not have symptoms. A pelvic examination may be necessary if you have symptoms including those associated with pelvic infections. Ask your health care provider if a screening pelvic exam is right for you.   The Pap test is the screening test for cervical cancer for women who are considered at risk.  If you had a hysterectomy for a problem that was not cancer or a condition that could lead to cancer, then you no longer need Pap tests.  If you are older than 65 years, and you have had normal Pap tests for the past 10 years, you no longer need to have Pap tests.  If you have had past treatment for cervical cancer or a condition that could lead to cancer, you need Pap tests and screening for cancer for at least 20 years after your treatment.  If you no longer get a Pap test, assess your risk factors if they change (such as having a new sexual partner). This can affect whether you should start being screened again.  Some women have medical problems that increase their chance of getting cervical cancer. If this is the case for you, your health care provider may recommend more frequent screening and Pap tests.  The human papillomavirus (HPV) test is another test that may be used for cervical cancer screening. The HPV test looks for the virus that can cause cell changes in the cervix. The cells collected during the Pap test can be tested for HPV.  The HPV test can be used to screen women 84 years of age and older. Getting tested for HPV can extend the interval between normal Pap tests from three to  five years.  An HPV test also should be used to screen women of any age who have unclear Pap test results.  After 67 years of age, women should have HPV testing as often as Pap tests.  Colorectal Cancer  This type of cancer can be detected and often prevented.  Routine colorectal cancer screening usually begins at 67 years of age and continues through 67 years of age.  Your health care provider may recommend screening at an earlier age if you have risk factors for colon cancer.  Your health care provider may also recommend using home test kits to check for hidden blood in the stool.  A small camera at the end of a tube can be used to examine your colon directly (sigmoidoscopy or colonoscopy). This is done to check for the earliest forms of colorectal cancer.  Routine screening usually begins at age 1.  Direct examination of the colon should be repeated every 5-10 years through 67 years of age. However, you may need to be screened more often if early forms of precancerous polyps or small growths are found. Skin Cancer  Check your skin from head to toe regularly.  Tell your health care provider about any new moles or changes in moles, especially if there is a change in a mole's shape or color.  Also tell your health care provider if you have a mole that is larger than the size of a pencil eraser.  Always use sunscreen. Apply sunscreen liberally and repeatedly throughout the day.  Protect yourself by wearing long sleeves, pants, a wide-brimmed hat, and sunglasses whenever you are outside. HEART DISEASE, DIABETES, AND HIGH BLOOD PRESSURE   Have your blood pressure checked at least every 1-2 years. High blood pressure causes heart disease and increases the risk of stroke.  If you are between 37 years and 44 years old, ask your health care provider if you should take aspirin to prevent strokes.  Have regular diabetes screenings. This involves taking a blood sample to check your  fasting blood sugar level.  If you are at a normal weight and have a low risk for diabetes, have this test once every three years after 67 years of age.  If you are overweight and have a high risk for diabetes, consider being tested at a younger age or more often. PREVENTING INFECTION  Hepatitis B  If you have a higher risk for hepatitis B, you should be screened for this virus. You are considered at high risk for hepatitis B if:  You were born in a country where hepatitis B is common. Ask your health care provider which countries are considered high risk.  Your parents were born in a high-risk country, and you have not been immunized against hepatitis B (hepatitis B vaccine).  You have HIV or AIDS.  You use needles to inject street drugs.  You live with someone who has hepatitis B.  You have had sex with someone who has hepatitis B.  You get hemodialysis treatment.  You take certain medicines for conditions, including cancer, organ transplantation, and autoimmune conditions. Hepatitis C  Blood testing is recommended for:  Everyone born from [birth year] through 1964.  Anyone with known risk factors for hepatitis C. Sexually transmitted infections (STIs)  You should be screened for sexually transmitted infections (STIs) including gonorrhea and chlamydia if:  You are sexually active and are younger than 67 years of age.  You are older than 67 years of age and your health care provider tells you that you are at risk for this type of infection.  Your sexual activity has changed since you were last screened and you are at an increased risk for chlamydia or gonorrhea. Ask your health care provider if you are at risk.  If you do not have HIV, but are at risk, it may be recommended that you take a prescription medicine daily to prevent HIV infection. This is called pre-exposure prophylaxis (PrEP). You are considered at risk if:  You are sexually active and do not regularly use condoms or  know the HIV status of your partner(s).  You take drugs by injection.  You are sexually active with a partner who has HIV. Talk with your health care provider about whether you are at high risk of being infected with HIV. If you choose to begin PrEP, you should first  be tested for HIV. You should then be tested every 3 months for as long as you are taking PrEP.  PREGNANCY   If you are premenopausal and you may become pregnant, ask your health care provider about preconception counseling.  If you may become pregnant, take 400 to 800 micrograms (mcg) of folic acid every day.  If you want to prevent pregnancy, talk to your health care provider about birth control (contraception). OSTEOPOROSIS AND MENOPAUSE   Osteoporosis is a disease in which the bones lose minerals and strength with aging. This can result in serious bone fractures. Your risk for osteoporosis can be identified using a bone density scan.  If you are 99 years of age or older, or if you are at risk for osteoporosis and fractures, ask your health care provider if you should be screened.  Ask your health care provider whether you should take a calcium or vitamin D supplement to lower your risk for osteoporosis.  Menopause may have certain physical symptoms and risks.  Hormone replacement therapy may reduce some of these symptoms and risks. Talk to your health care provider about whether hormone replacement therapy is right for you.  HOME CARE INSTRUCTIONS   Schedule regular health, dental, and eye exams.  Stay current with your immunizations.   Do not use any tobacco products including cigarettes, chewing tobacco, or electronic cigarettes.  If you are pregnant, do not drink alcohol.  If you are breastfeeding, limit how much and how often you drink alcohol.  Limit alcohol intake to no more than 1 drink per day for nonpregnant women. One drink equals 12 ounces of beer, 5 ounces of wine, or 1 ounces of hard liquor.  Do  not use street drugs.  Do not share needles.  Ask your health care provider for help if you need support or information about quitting drugs.  Tell your health care provider if you often feel depressed.  Tell your health care provider if you have ever been abused or do not feel safe at home. Document Released: 08/27/2010 Document Revised: 06/28/2013 Document Reviewed: 01/13/2013 [facility] Patient Information 2015 [place], [place]. This information is not intended to replace advice given to you by your health care provider. Make sure you discuss any questions you have with your health care provider.

## 2014-01-05 NOTE — Progress Notes (Signed)
Pre-visit discussion using our clinic review tool. No additional management support is needed unless otherwise documented below in the visit note.  

## 2014-01-08 DIAGNOSIS — E669 Obesity, unspecified: Secondary | ICD-10-CM | POA: Insufficient documentation

## 2014-01-08 NOTE — Assessment & Plan Note (Signed)
Improving with mycolog ointment

## 2014-01-08 NOTE — Assessment & Plan Note (Signed)
trazodone for bedtime use and alprazolam for once or twice daily use.

## 2014-01-08 NOTE — Progress Notes (Signed)
Patient ID: Brenda Leon, female   DOB: [birth year], 67 y.o.   MRN: [id number]  The patient is here for annual Medicare wellness examination and management of other chronic and acute problems.   The risk factors are reflected in the social history.  The roster of all physicians providing medical care to patient - is listed in the Snapshot section of the chart.  Activities of daily living:  The patient is 100% independent in all ADLs: dressing, toileting, feeding as well as independent mobility  Home safety : The patient has smoke detectors in the home. They wear seatbelts.  There are no firearms at home. There is no violence in the home.   There is no risks for hepatitis, STDs or HIV. There is no   history of blood transfusion. They have no travel history to infectious disease endemic areas of the world.  The patient has seen their dentist in the last six month. They have seen their eye doctor in the last year. They admit to slight hearing difficulty with regard to whispered voices and some television programs.  They have deferred audiologic testing in the last year.  They do not  have excessive sun exposure. Discussed the need for sun protection: hats, long sleeves and use of sunscreen if there is significant sun exposure.   Diet: the importance of a healthy diet is discussed. They do have a healthy diet.  The benefits of regular aerobic exercise were discussed. She walks 4 times per week ,  20 minutes.   Depression screen: there are no signs or vegative symptoms of depression- irritability, change in appetite, anhedonia, sadness/tearfullness.  Cognitive assessment: the patient manages all their financial and personal affairs and is actively engaged. They could relate day,date,year and events; recalled 2/3 objects at 3 minutes; performed clock-face test normally.  The following portions of the patient's history were reviewed and updated as appropriate: allergies, current medications, past  family history, past medical history,  past surgical history, past social history  and problem list.  Visual acuity was not assessed per patient preference since she has regular follow up with her ophthalmologist. Hearing and body mass index were assessed and reviewed.   During the course of the visit the patient was educated and counseled about appropriate screening and preventive services including : fall prevention , diabetes screening, nutrition counseling, colorectal cancer screening, and recommended immunizations.    Objective:  BP 106/60 mmHg  Pulse 83  Temp(Src) 98.7 F (37.1 C) (Oral)  Resp 16  Ht 5\' 7"  (1.702 m)  Wt 194 lb 12 oz (88.338 kg)  BMI 30.49 kg/m2  SpO2 98%  General appearance: alert, cooperative and appears stated age Head: Normocephalic, without obvious abnormality, atraumatic Eyes: conjunctivae/corneas clear. PERRL, EOM's intact. Fundi benign. Ears: normal TM's and external ear canals both ears Nose: Nares normal. Septum midline. Mucosa normal. No drainage or sinus tenderness. Throat: lips, mucosa, and tongue normal; teeth and gums normal Neck: no adenopathy, no carotid bruit, no JVD, supple, symmetrical, trachea midline and thyroid not enlarged, symmetric, no tenderness/mass/nodules Lungs: clear to auscultation bilaterally Breasts: normal appearance, no masses or tenderness Heart: regular rate and rhythm, S1, S2 normal, no murmur, click, rub or gallop Abdomen: soft, non-tender; bowel sounds normal; no masses,  no organomegaly Extremities: extremities normal, atraumatic, no cyanosis or edema Pulses: 2+ and symmetric Skin: improving erythematous papular rash on left thorax and back  Neurologic: Alert and oriented X 3, normal strength and tone. Normal symmetric reflexes. Normal coordination and gait.  Assessment and Plan:   Essential hypertension, benign Well controlled on current regimen. She is due for repeat assessment of renal function, no changes  today.  Lab Results  Component Value Date   CREATININE 0.5 06/25/2013   Lab Results  Component Value Date   NA 144 06/25/2013   K 4.9 06/25/2013   CL 107 06/25/2013   CO2 29 06/25/2013     Candidal dermatitis Improving with mycolog ointment   Routine general medical examination at a health care facility Annual Medicare wellness  exam was done as well as a comprehensive physical exam and management of acute and chronic conditions .  During the course of the visit the patient was educated and counseled about appropriate screening and preventive services including : fall prevention , diabetes screening, nutrition counseling, colorectal cancer screening, and recommended immunizations.  Printed recommendations for health maintenance screenings was given.   Obesity I have addressed  BMI and recommended wt loss of 10% of body weight over the next 6 months using a low glycemic index diet and regular exercise a minimum of 5 days per week.    Generalized anxiety disorder  trazodone for bedtime use and alprazolam for once or twice daily use.   Hyperlipidemia with target LDL less than 130 She has been taking RYR 600 mg bid and will return for fasting lipids    Updated Medication List Outpatient Encounter Prescriptions as of 01/05/2014  Medication Sig  . Ascorbic Acid (VITAMIN C) 100 MG tablet Take 100 mg by mouth daily.  Marland Kitchen b complex vitamins tablet Take 1 tablet by mouth daily.  . Doxylamine Succinate, Sleep, (SLEEP AID PO) Take 50 mg by mouth at bedtime.  . Flaxseed, Linseed, (FLAX SEED OIL PO) Take by mouth.  . Ginkgo Biloba 40 MG TABS Take 120 mg by mouth daily.   . Horse Chestnut 300 MG CAPS Take 1 capsule by mouth daily.  Marland Kitchen lisinopril (PRINIVIL,ZESTRIL) 30 MG tablet TAKE ONE TABLET BY MOUTH ONCE DAILY  . naproxen (NAPROSYN) 250 MG tablet Take 220 mg by mouth 2 (two) times daily with a meal.   . Nutritional Supplements (ESTROVEN MAXIMUM STRENGTH PO) Take 1 capsule by mouth daily.   Marland Kitchen nystatin-triamcinolone ointment (MYCOLOG) Apply 1 application topically 2 (two) times daily.  Marland Kitchen omeprazole (PRILOSEC) 10 MG capsule Take 20 mg by mouth daily.   . Red Yeast Rice Extract 600 MG CAPS Take 1 capsule (600 mg total) by mouth daily. (Patient taking differently: Take 1 capsule by mouth 2 (two) times daily. )  . Specialty Vitamins Products (WEIGHT LOSS DAILY MULTI) TABS Take 2 tablets by mouth daily. 15 day weight loss supplement.  . [DISCONTINUED] calcium carbonate (OS-CAL) 600 MG TABS tablet Take 600 mg by mouth daily with breakfast.  . [DISCONTINUED] gabapentin (NEURONTIN) 100 MG capsule Take 1-2 tablets nightly as needed for nerve pain.  . [DISCONTINUED] Melatonin 3 MG CAPS Take 2 capsules by mouth at bedtime.  . [DISCONTINUED] vitamin E 100 UNIT capsule Take 1,000 Units by mouth daily.

## 2014-01-08 NOTE — Assessment & Plan Note (Signed)
I have addressed  BMI and recommended wt loss of 10% of body weight over the next 6 months using a low glycemic index diet and regular exercise a minimum of 5 days per week.   

## 2014-01-08 NOTE — Assessment & Plan Note (Signed)
She has been taking RYR 600 mg bid and will return for fasting lipids

## 2014-01-08 NOTE — Assessment & Plan Note (Signed)

## 2014-01-08 NOTE — Addendum Note (Signed)
Addended by: Crecencio Mc on: 01/08/2014 07:21 PM   Modules accepted: Level of Service

## 2014-01-08 NOTE — Assessment & Plan Note (Signed)
Well controlled on current regimen. She is due for repeat assessment of renal function, no changes today.  Lab Results  Component Value Date   CREATININE 0.5 06/25/2013   Lab Results  Component Value Date   NA 144 06/25/2013   K 4.9 06/25/2013   CL 107 06/25/2013   CO2 29 06/25/2013

## 2014-01-10 ENCOUNTER — Other Ambulatory Visit: Payer: Self-pay | Admitting: Internal Medicine

## 2014-01-12 ENCOUNTER — Other Ambulatory Visit (INDEPENDENT_AMBULATORY_CARE_PROVIDER_SITE_OTHER): Payer: Medicare Other

## 2014-01-12 DIAGNOSIS — R5383 Other fatigue: Secondary | ICD-10-CM

## 2014-01-12 DIAGNOSIS — E785 Hyperlipidemia, unspecified: Secondary | ICD-10-CM

## 2014-01-12 DIAGNOSIS — I10 Essential (primary) hypertension: Secondary | ICD-10-CM

## 2014-01-12 LAB — CBC WITH DIFFERENTIAL/PLATELET
BASOS ABS: 0 10*3/uL (ref 0.0–0.1)
Basophils Relative: 0.4 % (ref 0.0–3.0)
Eosinophils Absolute: 0.4 10*3/uL (ref 0.0–0.7)
Eosinophils Relative: 7 % — ABNORMAL HIGH (ref 0.0–5.0)
HEMATOCRIT: 33.6 % — AB (ref 36.0–46.0)
Hemoglobin: 11.3 g/dL — ABNORMAL LOW (ref 12.0–15.0)
LYMPHS ABS: 1.2 10*3/uL (ref 0.7–4.0)
LYMPHS PCT: 21.9 % (ref 12.0–46.0)
MCHC: 33.7 g/dL (ref 30.0–36.0)
MCV: 88.8 fl (ref 78.0–100.0)
Monocytes Absolute: 0.4 10*3/uL (ref 0.1–1.0)
Monocytes Relative: 7.3 % (ref 3.0–12.0)
Neutro Abs: 3.6 10*3/uL (ref 1.4–7.7)
Neutrophils Relative %: 63.4 % (ref 43.0–77.0)
Platelets: 233 10*3/uL (ref 150.0–400.0)
RBC: 3.79 Mil/uL — ABNORMAL LOW (ref 3.87–5.11)
RDW: 13.9 % (ref 11.5–15.5)
WBC: 5.7 10*3/uL (ref 4.0–10.5)

## 2014-01-12 LAB — LIPID PANEL
Cholesterol: 182 mg/dL (ref 0–200)
HDL: 46.3 mg/dL (ref >39.00)
LDL Cholesterol: 118 mg/dL — ABNORMAL HIGH (ref 0–99)
NONHDL: 135.7
Total CHOL/HDL Ratio: 4
Triglycerides: 91 mg/dL (ref 0.0–149.0)
VLDL: 18.2 mg/dL (ref 0.0–40.0)

## 2014-01-12 LAB — COMPREHENSIVE METABOLIC PANEL
ALT: 21 U/L (ref 0–35)
AST: 20 U/L (ref 0–37)
Albumin: 4 g/dL (ref 3.5–5.2)
Alkaline Phosphatase: 48 U/L (ref 39–117)
BILIRUBIN TOTAL: 0.6 mg/dL (ref 0.2–1.2)
BUN: 18 mg/dL (ref 6–23)
CO2: 27 meq/L (ref 19–32)
Calcium: 8.9 mg/dL (ref 8.4–10.5)
Chloride: 108 mEq/L (ref 96–112)
Creatinine, Ser: 0.6 mg/dL (ref 0.4–1.2)
GFR: 98.29 mL/min (ref >60.00)
Glucose, Bld: 82 mg/dL (ref 70–99)
Potassium: 4.8 mEq/L (ref 3.5–5.1)
SODIUM: 142 meq/L (ref 135–145)
TOTAL PROTEIN: 6.4 g/dL (ref 6.0–8.3)

## 2014-01-12 LAB — TSH: TSH: 2.3 u[IU]/mL (ref 0.35–4.50)

## 2014-01-15 ENCOUNTER — Encounter: Payer: Self-pay | Admitting: Internal Medicine

## 2014-01-15 ENCOUNTER — Other Ambulatory Visit: Payer: Self-pay | Admitting: Internal Medicine

## 2014-01-15 DIAGNOSIS — D649 Anemia, unspecified: Secondary | ICD-10-CM

## 2014-01-16 ENCOUNTER — Encounter: Payer: Self-pay | Admitting: Internal Medicine

## 2014-01-18 NOTE — Telephone Encounter (Signed)
Please advise the patient.

## 2014-02-15 ENCOUNTER — Ambulatory Visit (INDEPENDENT_AMBULATORY_CARE_PROVIDER_SITE_OTHER): Payer: 59 | Admitting: Psychology

## 2014-02-15 DIAGNOSIS — F4323 Adjustment disorder with mixed anxiety and depressed mood: Secondary | ICD-10-CM

## 2014-03-16 ENCOUNTER — Ambulatory Visit: Payer: 59 | Admitting: Psychology

## 2014-03-29 ENCOUNTER — Encounter: Payer: Self-pay | Admitting: Internal Medicine

## 2014-04-08 ENCOUNTER — Ambulatory Visit: Payer: Self-pay | Admitting: Internal Medicine

## 2014-04-22 ENCOUNTER — Encounter: Payer: Self-pay | Admitting: Internal Medicine

## 2014-05-17 ENCOUNTER — Encounter: Payer: Self-pay | Admitting: Internal Medicine

## 2014-05-19 ENCOUNTER — Other Ambulatory Visit (INDEPENDENT_AMBULATORY_CARE_PROVIDER_SITE_OTHER): Payer: Medicare Other

## 2014-05-19 DIAGNOSIS — Z79899 Other long term (current) drug therapy: Secondary | ICD-10-CM

## 2014-05-19 DIAGNOSIS — D649 Anemia, unspecified: Secondary | ICD-10-CM

## 2014-05-19 LAB — CBC WITH DIFFERENTIAL/PLATELET
BASOS PCT: 0.6 % (ref 0.0–3.0)
Basophils Absolute: 0 10*3/uL (ref 0.0–0.1)
EOS PCT: 5 % (ref 0.0–5.0)
Eosinophils Absolute: 0.3 10*3/uL (ref 0.0–0.7)
HCT: 35.8 % — ABNORMAL LOW (ref 36.0–46.0)
Hemoglobin: 12.2 g/dL (ref 12.0–15.0)
Lymphocytes Relative: 30.9 % (ref 12.0–46.0)
Lymphs Abs: 1.7 10*3/uL (ref 0.7–4.0)
MCHC: 34.2 g/dL (ref 30.0–36.0)
MCV: 89.1 fl (ref 78.0–100.0)
MONO ABS: 0.4 10*3/uL (ref 0.1–1.0)
Monocytes Relative: 6.5 % (ref 3.0–12.0)
NEUTROS ABS: 3.2 10*3/uL (ref 1.4–7.7)
NEUTROS PCT: 57 % (ref 43.0–77.0)
Platelets: 260 10*3/uL (ref 150.0–400.0)
RBC: 4.02 Mil/uL (ref 3.87–5.11)
RDW: 13.3 % (ref 11.5–15.5)
WBC: 5.6 10*3/uL (ref 4.0–10.5)

## 2014-05-19 LAB — VITAMIN B12: VITAMIN B 12: 509 pg/mL (ref 211–911)

## 2014-05-19 LAB — FERRITIN: FERRITIN: 83 ng/mL (ref 10.0–291.0)

## 2014-05-20 LAB — IRON AND TIBC
%SAT: 22 % (ref 20–55)
IRON: 79 ug/dL (ref 42–145)
TIBC: 363 ug/dL (ref 250–470)
UIBC: 284 ug/dL (ref 125–400)

## 2014-05-20 LAB — FOLATE RBC: RBC Folate: 940 ng/mL (ref >280)

## 2014-05-23 ENCOUNTER — Encounter: Payer: Self-pay | Admitting: Internal Medicine

## 2014-07-05 ENCOUNTER — Other Ambulatory Visit: Payer: Self-pay | Admitting: Internal Medicine

## 2014-08-16 ENCOUNTER — Other Ambulatory Visit: Payer: Self-pay | Admitting: Internal Medicine

## 2014-08-16 DIAGNOSIS — Z1231 Encounter for screening mammogram for malignant neoplasm of breast: Secondary | ICD-10-CM

## 2014-09-01 ENCOUNTER — Ambulatory Visit (HOSPITAL_COMMUNITY)
Admission: RE | Admit: 2014-09-01 | Discharge: 2014-09-01 | Disposition: A | Discharge status: Home / Self Care | Payer: Medicare Other | Source: Ambulatory Visit | Attending: Internal Medicine | Admitting: Internal Medicine

## 2014-09-01 DIAGNOSIS — Z1231 Encounter for screening mammogram for malignant neoplasm of breast: Secondary | ICD-10-CM

## 2014-10-05 ENCOUNTER — Other Ambulatory Visit: Payer: Self-pay | Admitting: Internal Medicine

## 2014-10-06 ENCOUNTER — Other Ambulatory Visit: Payer: Self-pay

## 2014-10-06 MED ORDER — LISINOPRIL 30 MG PO TABS: 30.0000 mg | ORAL_TABLET | Freq: Every day | ORAL | Status: DC

## 2014-10-06 NOTE — Telephone Encounter (Signed)
Patient has cancelled past appointments, but she does have a appointment on 11/03/14. Please advise how you'd like to proceed with refill?

## 2014-10-06 NOTE — Telephone Encounter (Signed)
  A refill for 30 days was authorized  And sent

## 2014-11-03 ENCOUNTER — Ambulatory Visit (INDEPENDENT_AMBULATORY_CARE_PROVIDER_SITE_OTHER): Payer: Medicare Other | Admitting: Internal Medicine

## 2014-11-03 ENCOUNTER — Encounter: Payer: Self-pay | Admitting: Internal Medicine

## 2014-11-03 VITALS — BP 104/60 | HR 80 | Temp 98.8°F | Resp 12 | Ht 67.0 in | Wt 189.5 lb

## 2014-11-03 DIAGNOSIS — K2970 Gastritis, unspecified, without bleeding: Secondary | ICD-10-CM

## 2014-11-03 DIAGNOSIS — F419 Anxiety disorder, unspecified: Secondary | ICD-10-CM | POA: Diagnosis not present

## 2014-11-03 DIAGNOSIS — K2990 Gastroduodenitis, unspecified, without bleeding: Secondary | ICD-10-CM

## 2014-11-03 DIAGNOSIS — F5105 Insomnia due to other mental disorder: Secondary | ICD-10-CM

## 2014-11-03 DIAGNOSIS — Z23 Encounter for immunization: Secondary | ICD-10-CM | POA: Diagnosis not present

## 2014-11-03 DIAGNOSIS — I10 Essential (primary) hypertension: Secondary | ICD-10-CM

## 2014-11-03 DIAGNOSIS — K219 Gastro-esophageal reflux disease without esophagitis: Secondary | ICD-10-CM

## 2014-11-03 MED ORDER — ALPRAZOLAM 0.5 MG PO TABS
0.5000 mg | ORAL_TABLET | Freq: Every evening | ORAL | Status: DC | PRN
Start: 2014-11-03 — End: 2015-03-03

## 2014-11-03 MED ORDER — RANITIDINE HCL 300 MG PO TABS: 300.0000 mg | ORAL_TABLET | Freq: Every day | ORAL | Status: DC

## 2014-11-03 MED ORDER — TRAMADOL HCL 50 MG PO TABS: 50.0000 mg | ORAL_TABLET | Freq: Every day | ORAL | Status: DC | PRN

## 2014-11-03 NOTE — Patient Instructions (Addendum)
.   I appreciate your concern about continuing your PPI in light of the recently published studies suggesting an association with increased risk of dementia and kidney failure.   Since your stool test was negative for blood , you don't have a bleeding ulcer,  But you should suspend your naproxen  For a few weeks and use tramadol instead  , after dinner  Because it may be causing gastritis    I sent an rx for  ranitidine 300 mg once daily in the morning .  You can add a 150 mg dose of zantac in the evening if needed.  This is an  H2 blocker .   if your abdominal symptoms are not controlled on this regimen,  Resume prilosec .   Trial of alprazolam to help you rest at night   FLU VACCINE GIVEN    [id number]

## 2014-11-03 NOTE — Progress Notes (Signed)
Pre-visit discussion using our clinic review tool. No additional management support is needed unless otherwise documented below in the visit note.  

## 2014-11-03 NOTE — Progress Notes (Signed)
Subjective:  Patient ID: Brenda Leon, female    DOB: 10-21-1945  Age: 69 y.o. MRN: [id number]  CC: The primary encounter diagnosis was Encounter for immunization. Diagnoses of Gastritis and gastroduodenitis, Insomnia secondary to anxiety, and Essential hypertension, benign were also pertinent to this visit.  HPI Brenda Leon presents for follow up on hypertension , hyperlipidemia and VI. Last seen Nov 2015,  Taking medications tolerating them without side effects.  Has been spending most nights and days caring for her sister who has ESKD and PAD.  Weight loss is due to not eating 3 meals daily due to caregiver activities.     Having recurrent episodes of mid abd pain .  Has been taking naproxen daily for back pain   Outpatient Prescriptions Prior to Visit  Medication Sig Dispense Refill  . b complex vitamins tablet Take 1 tablet by mouth daily.    . Doxylamine Succinate, Sleep, (SLEEP AID PO) Take 50 mg by mouth at bedtime.    . Flaxseed, Linseed, (FLAX SEED OIL PO) Take by mouth.    . Ginkgo Biloba 40 MG TABS Take 120 mg by mouth daily.     . Horse Chestnut 300 MG CAPS Take 1 capsule by mouth daily.    Marland Kitchen lisinopril (PRINIVIL,ZESTRIL) 30 MG tablet Take 1 tablet (30 mg total) by mouth daily. 30 tablet 0  . naproxen (NAPROSYN) 250 MG tablet Take 220 mg by mouth daily after supper.     . Nutritional Supplements (ESTROVEN MAXIMUM STRENGTH PO) Take 1 capsule by mouth daily.    . Red Yeast Rice Extract 600 MG CAPS Take 1 capsule (600 mg total) by mouth daily. (Patient taking differently: Take 1 capsule by mouth 2 (two) times daily. ) 60 capsule 0  . Ascorbic Acid (VITAMIN C) 100 MG tablet Take 100 mg by mouth daily.    Marland Kitchen nystatin-triamcinolone ointment (MYCOLOG) Apply 1 application topically 2 (two) times daily. 30 g 0  . omeprazole (PRILOSEC) 10 MG capsule Take 20 mg by mouth daily.     Marland Kitchen Specialty Vitamins Products (WEIGHT LOSS DAILY MULTI) TABS Take 2 tablets by mouth daily. 15 day weight  loss supplement.     No facility-administered medications prior to visit.    Review of Systems;  Patient denies headache, fevers, malaise, unintentional weight loss, skin rash, eye pain, sinus congestion and sinus pain, sore throat, dysphagia,  hemoptysis , cough, dyspnea, wheezing, chest pain, palpitations, orthopnea, edema, abdominal pain, nausea, melena, diarrhea, constipation, flank pain, dysuria, hematuria, urinary  Frequency, nocturia, numbness, tingling, seizures,  Focal weakness, Loss of consciousness,  Tremor, insomnia, depression, anxiety, and suicidal ideation.      Objective:  BP 104/60 mmHg  Pulse 80  Temp(Src) 98.8 F (37.1 C) (Oral)  Resp 12  Ht 5\' 7"  (1.702 m)  Wt 189 lb 8 oz (85.957 kg)  BMI 29.67 kg/m2  SpO2 97%  BP Readings from Last 3 Encounters:  11/03/14 104/60  01/05/14 106/60  12/29/13 118/58    Wt Readings from Last 3 Encounters:  11/03/14 189 lb 8 oz (85.957 kg)  01/05/14 194 lb 12 oz (88.338 kg)  12/29/13 196 lb (88.905 kg)    General appearance: alert, cooperative and appears stated age Ears: normal TM's and external ear canals both ears Throat: lips, mucosa, and tongue normal; teeth and gums normal Neck: no adenopathy, no carotid bruit, supple, symmetrical, trachea midline and thyroid not enlarged, symmetric, no tenderness/mass/nodules Back: symmetric, no curvature. ROM normal. No CVA tenderness.  Lungs: clear to auscultation bilaterally Heart: regular rate and rhythm, S1, S2 normal, no murmur, click, rub or gallop Abdomen: soft, non-tender; bowel sounds normal; no masses,  no organomegaly Pulses: 2+ and symmetric Skin: Skin color, texture, turgor normal. No rashes or lesions Lymph nodes: Cervical, supraclavicular, and axillary nodes normal. Rectal: internal emorrhoids,  Stool brown.   No results found for: HGBA1C  Lab Results  Component Value Date   CREATININE 0.6 01/12/2014   CREATININE 0.5 06/25/2013   CREATININE 0.61 12/20/2011     Lab Results  Component Value Date   WBC 5.6 05/19/2014   HGB 12.2 05/19/2014   HCT 35.8* 05/19/2014   PLT 260.0 05/19/2014   GLUCOSE 82 01/12/2014   CHOL 182 01/12/2014   TRIG 91.0 01/12/2014   HDL 46.30 01/12/2014   LDLDIRECT 167.8 02/14/2012   LDLCALC 118* 01/12/2014   ALT 21 01/12/2014   AST 20 01/12/2014   NA 142 01/12/2014   K 4.8 01/12/2014   CL 108 01/12/2014   CREATININE 0.6 01/12/2014   BUN 18 01/12/2014   CO2 27 01/12/2014   TSH 2.30 01/12/2014    Mm Digital Screening Bilateral  09/01/2014   CLINICAL DATA:  Screening.  EXAM: DIGITAL SCREENING BILATERAL MAMMOGRAM WITH CAD  COMPARISON:  Previous exam(s).  ACR Breast Density Category b: There are scattered areas of fibroglandular density.  FINDINGS: There are no findings suspicious for malignancy. Images were processed with CAD.  IMPRESSION: No mammographic evidence of malignancy. A result letter of this screening mammogram will be mailed directly to the patient.  RECOMMENDATION: Screening mammogram in one year. (Code:SM-B-01Y)  BI-RADS CATEGORY  1: Negative.   Electronically Signed   By: Lajean Manes M.D.   On: 09/01/2014 12:31    Assessment & Plan:   Problem List Items Addressed This Visit      Unprioritized   Essential hypertension, benign    Well controlled on current regimen. Renal function stable, no changes today.  Lab Results  Component Value Date   CREATININE 0.6 01/12/2014   Lab Results  Component Value Date   NA 142 01/12/2014   K 4.8 01/12/2014   CL 108 01/12/2014   CO2 27 01/12/2014         Gastritis and gastroduodenitis    Rectal exam was negative for blood.  advised to stop naproxen use daily and use tramadolinsteadad.  PPI x 2 weeks,  If no improvement refer to GI      Insomnia secondary to anxiety    Secondary to caregiver responsibilities for sister  Who has EKSD and hypertension.  Trial of alprazolam. The risks and benefits of benzodiazepine use were discussed with patient today  including excessive sedation leading to respiratory depression,  impaired thinking/driving, and addiction.  Patient was advised to avoid concurrent use with alcohol, to use medication only as needed and not to share with others  .       Relevant Medications   ALPRAZolam (XANAX) 0.5 MG tablet    Other Visit Diagnoses    Encounter for immunization    -  Primary       I have discontinued Ms. Caiazzo's omeprazole, vitamin C, WEIGHT LOSS DAILY MULTI, and nystatin-triamcinolone ointment. I am also having her start on ALPRAZolam, ranitidine, and traMADol. Additionally, I am having her maintain her (Flaxseed, Linseed, (FLAX SEED OIL PO)), b complex vitamins, naproxen, Ginkgo Biloba, Red Yeast Rice Extract, (Doxylamine Succinate, Sleep, (SLEEP AID PO)), Horse Chestnut, Nutritional Supplements (ESTROVEN MAXIMUM STRENGTH PO), lisinopril, acetaminophen,  and aluminum hydroxide-magnesium carbonate.  Meds ordered this encounter  Medications  . acetaminophen (TYLENOL) 650 MG CR tablet    Sig: Take 650 mg by mouth daily.  Marland Kitchen aluminum hydroxide-magnesium carbonate (GAVISCON) 95-358 MG/15ML SUSP    Sig: Take 15 mLs by mouth as needed.  . ALPRAZolam (XANAX) 0.5 MG tablet    Sig: Take 1 tablet (0.5 mg total) by mouth at bedtime as needed for anxiety.    Dispense:  30 tablet    Refill:  3  . ranitidine (ZANTAC) 300 MG tablet    Sig: Take 1 tablet (300 mg total) by mouth at bedtime.    Dispense:  90 tablet    Refill:  1  . traMADol (ULTRAM) 50 MG tablet    Sig: Take 1 tablet (50 mg total) by mouth daily as needed for moderate pain.    Dispense:  30 tablet    Refill:  5   A total of 25 minutes of face to face time was spent with patient more than half of which was spent in counselling about the above mentioned conditions  and coordination of care    Medications Discontinued During This Encounter  Medication Reason  . Ascorbic Acid (VITAMIN C) 100 MG tablet Error  . nystatin-triamcinolone ointment (MYCOLOG)  Completed Course  . Specialty Vitamins Products (WEIGHT LOSS DAILY MULTI) TABS Patient Preference  . omeprazole (PRILOSEC) 10 MG capsule     Follow-up: Return in about 6 months (around 05/03/2015).   Crecencio Mc, MD

## 2014-11-05 ENCOUNTER — Encounter: Payer: Self-pay | Admitting: Internal Medicine

## 2014-11-05 DIAGNOSIS — K2970 Gastritis, unspecified, without bleeding: Secondary | ICD-10-CM | POA: Insufficient documentation

## 2014-11-05 DIAGNOSIS — F419 Anxiety disorder, unspecified: Secondary | ICD-10-CM | POA: Insufficient documentation

## 2014-11-05 DIAGNOSIS — F5105 Insomnia due to other mental disorder: Secondary | ICD-10-CM

## 2014-11-05 DIAGNOSIS — K2990 Gastroduodenitis, unspecified, without bleeding: Secondary | ICD-10-CM

## 2014-11-05 NOTE — Assessment & Plan Note (Signed)
Discussed current controversy regarding prolonged use of PPI in patients without documented Barretts esophagus.  Patient has no prior EGD but is currently report gastritis since switching.  Resume  PPI for now.

## 2014-11-05 NOTE — Assessment & Plan Note (Signed)
Rectal exam was negative for blood.  advised to stop naproxen use daily and use tramadolinsteadad.  PPI x 2 weeks,  If no improvement refer to GI

## 2014-11-05 NOTE — Assessment & Plan Note (Signed)
Secondary to caregiver responsibilities for sister  Who has EKSD and hypertension.  Trial of alprazolam. The risks and benefits of benzodiazepine use were discussed with patient today including excessive sedation leading to respiratory depression,  impaired thinking/driving, and addiction.  Patient was advised to avoid concurrent use with alcohol, to use medication only as needed and not to share with others  .

## 2014-11-05 NOTE — Assessment & Plan Note (Signed)
Well controlled on current regimen. Renal function stable, no changes today.  Lab Results  Component Value Date   CREATININE 0.6 01/12/2014   Lab Results  Component Value Date   NA 142 01/12/2014   K 4.8 01/12/2014   CL 108 01/12/2014   CO2 27 01/12/2014

## 2014-11-07 ENCOUNTER — Other Ambulatory Visit: Payer: Self-pay | Admitting: Internal Medicine

## 2014-12-20 ENCOUNTER — Ambulatory Visit (INDEPENDENT_AMBULATORY_CARE_PROVIDER_SITE_OTHER): Payer: 59 | Admitting: Psychology

## 2014-12-20 DIAGNOSIS — F4323 Adjustment disorder with mixed anxiety and depressed mood: Secondary | ICD-10-CM

## 2015-01-03 ENCOUNTER — Ambulatory Visit (INDEPENDENT_AMBULATORY_CARE_PROVIDER_SITE_OTHER): Payer: 59 | Admitting: Psychology

## 2015-01-03 DIAGNOSIS — F4323 Adjustment disorder with mixed anxiety and depressed mood: Secondary | ICD-10-CM

## 2015-01-10 ENCOUNTER — Ambulatory Visit: Payer: 59 | Admitting: Psychology

## 2015-03-03 ENCOUNTER — Other Ambulatory Visit: Payer: Self-pay | Admitting: Internal Medicine

## 2015-03-06 NOTE — Telephone Encounter (Signed)
Pt last Rx filled 11/03/14 #30 with 3 refills, Pt last OV 11/03/14. Please advise/tvw

## 2015-03-07 NOTE — Telephone Encounter (Signed)
Ok to refill,  printed rx  

## 2015-03-07 NOTE — Telephone Encounter (Signed)
RX faxed

## 2015-05-06 ENCOUNTER — Other Ambulatory Visit: Payer: Self-pay | Admitting: Internal Medicine

## 2015-05-08 NOTE — Telephone Encounter (Addendum)
Last 11/13/14 OV ok to fill tramadol

## 2015-05-09 NOTE — Telephone Encounter (Signed)
Refill for 30 days only.  OFFICE VISIT NEEDED prior to any more refills 

## 2015-05-19 DIAGNOSIS — H52203 Unspecified astigmatism, bilateral: Secondary | ICD-10-CM | POA: Diagnosis not present

## 2015-05-19 DIAGNOSIS — H353131 Nonexudative age-related macular degeneration, bilateral, early dry stage: Secondary | ICD-10-CM | POA: Diagnosis not present

## 2015-05-19 DIAGNOSIS — H53002 Unspecified amblyopia, left eye: Secondary | ICD-10-CM | POA: Diagnosis not present

## 2015-05-19 DIAGNOSIS — H5010 Unspecified exotropia: Secondary | ICD-10-CM | POA: Diagnosis not present

## 2015-06-03 ENCOUNTER — Other Ambulatory Visit: Payer: Self-pay | Admitting: Internal Medicine

## 2015-06-11 ENCOUNTER — Encounter: Payer: Self-pay | Admitting: Internal Medicine

## 2015-06-19 ENCOUNTER — Encounter: Payer: Self-pay | Admitting: Internal Medicine

## 2015-06-19 NOTE — Telephone Encounter (Signed)
FYI,

## 2015-06-29 ENCOUNTER — Other Ambulatory Visit: Payer: Self-pay | Admitting: Internal Medicine

## 2015-06-29 ENCOUNTER — Encounter: Payer: Self-pay | Admitting: Internal Medicine

## 2015-06-29 MED ORDER — TRIAMCINOLONE ACETONIDE 0.1 % EX CREA: 1.0000 "application " | TOPICAL_CREAM | Freq: Two times a day (BID) | CUTANEOUS | Status: DC

## 2015-06-29 MED ORDER — NYSTATIN 100000 UNIT/GM EX OINT: 1.0000 "application " | TOPICAL_OINTMENT | Freq: Two times a day (BID) | CUTANEOUS | Status: DC

## 2015-07-07 ENCOUNTER — Encounter: Payer: Self-pay | Admitting: Internal Medicine

## 2015-07-20 ENCOUNTER — Other Ambulatory Visit: Payer: Self-pay | Admitting: Internal Medicine

## 2015-07-20 NOTE — Telephone Encounter (Signed)
Refill for 30 days only.  OFFICE VISIT NEEDED prior to any more refills 

## 2015-07-20 NOTE — Telephone Encounter (Signed)
Last OV 9/16 ok to fill?

## 2015-07-26 ENCOUNTER — Telehealth: Payer: Self-pay | Admitting: *Deleted

## 2015-08-11 ENCOUNTER — Ambulatory Visit (INDEPENDENT_AMBULATORY_CARE_PROVIDER_SITE_OTHER): Payer: 59 | Admitting: Psychology

## 2015-08-11 DIAGNOSIS — F4323 Adjustment disorder with mixed anxiety and depressed mood: Secondary | ICD-10-CM

## 2015-08-25 ENCOUNTER — Ambulatory Visit (INDEPENDENT_AMBULATORY_CARE_PROVIDER_SITE_OTHER): Payer: 59 | Admitting: Psychology

## 2015-08-25 DIAGNOSIS — F4323 Adjustment disorder with mixed anxiety and depressed mood: Secondary | ICD-10-CM

## 2015-09-01 ENCOUNTER — Encounter: Payer: Self-pay | Admitting: Internal Medicine

## 2015-09-01 ENCOUNTER — Ambulatory Visit (INDEPENDENT_AMBULATORY_CARE_PROVIDER_SITE_OTHER): Payer: Medicare Other | Admitting: Internal Medicine

## 2015-09-01 ENCOUNTER — Ambulatory Visit (INDEPENDENT_AMBULATORY_CARE_PROVIDER_SITE_OTHER): Payer: Medicare Other

## 2015-09-01 VITALS — BP 128/68 | HR 84 | Temp 98.1°F | Resp 10 | Ht 67.0 in | Wt 176.8 lb

## 2015-09-01 DIAGNOSIS — M25552 Pain in left hip: Secondary | ICD-10-CM

## 2015-09-01 DIAGNOSIS — Z0000 Encounter for general adult medical examination without abnormal findings: Secondary | ICD-10-CM | POA: Diagnosis not present

## 2015-09-01 DIAGNOSIS — E785 Hyperlipidemia, unspecified: Secondary | ICD-10-CM

## 2015-09-01 DIAGNOSIS — E669 Obesity, unspecified: Secondary | ICD-10-CM

## 2015-09-01 DIAGNOSIS — F419 Anxiety disorder, unspecified: Secondary | ICD-10-CM

## 2015-09-01 DIAGNOSIS — F4321 Adjustment disorder with depressed mood: Secondary | ICD-10-CM

## 2015-09-01 DIAGNOSIS — Z1239 Encounter for other screening for malignant neoplasm of breast: Secondary | ICD-10-CM | POA: Diagnosis not present

## 2015-09-01 DIAGNOSIS — E559 Vitamin D deficiency, unspecified: Secondary | ICD-10-CM

## 2015-09-01 DIAGNOSIS — F5105 Insomnia due to other mental disorder: Secondary | ICD-10-CM

## 2015-09-01 DIAGNOSIS — G8929 Other chronic pain: Secondary | ICD-10-CM

## 2015-09-01 LAB — COMPREHENSIVE METABOLIC PANEL
ALT: 16 U/L (ref 0–35)
AST: 14 U/L (ref 0–37)
Albumin: 4.2 g/dL (ref 3.5–5.2)
Alkaline Phosphatase: 60 U/L (ref 39–117)
BUN: 20 mg/dL (ref 6–23)
CALCIUM: 9.6 mg/dL (ref 8.4–10.5)
CHLORIDE: 104 meq/L (ref 96–112)
CO2: 32 meq/L (ref 19–32)
Creatinine, Ser: 0.67 mg/dL (ref 0.40–1.20)
GFR: 92.77 mL/min (ref >60.00)
Glucose, Bld: 102 mg/dL — ABNORMAL HIGH (ref 70–99)
POTASSIUM: 5.1 meq/L (ref 3.5–5.1)
Sodium: 139 mEq/L (ref 135–145)
Total Bilirubin: 0.4 mg/dL (ref 0.2–1.2)
Total Protein: 7.3 g/dL (ref 6.0–8.3)

## 2015-09-01 LAB — LIPID PANEL
CHOLESTEROL: 204 mg/dL — AB (ref 0–200)
HDL: 56.9 mg/dL (ref >39.00)
LDL Cholesterol: 128 mg/dL — ABNORMAL HIGH (ref 0–99)
NonHDL: 147.44
TRIGLYCERIDES: 95 mg/dL (ref 0.0–149.0)
Total CHOL/HDL Ratio: 4
VLDL: 19 mg/dL (ref 0.0–40.0)

## 2015-09-01 LAB — TSH: TSH: 1.28 u[IU]/mL (ref 0.35–4.50)

## 2015-09-01 LAB — VITAMIN D 25 HYDROXY (VIT D DEFICIENCY, FRACTURES): VITD: 27.87 ng/mL — ABNORMAL LOW (ref 30.00–100.00)

## 2015-09-01 MED ORDER — ALPRAZOLAM 0.25 MG PO TABS: 0.2500 mg | ORAL_TABLET | Freq: Every evening | ORAL | Status: DC | PRN

## 2015-09-01 MED ORDER — TRAMADOL HCL 50 MG PO TABS: ORAL_TABLET | ORAL | Status: DC

## 2015-09-01 MED ORDER — PREDNISONE 10 MG PO TABS: ORAL_TABLET | ORAL | Status: DC

## 2015-09-01 MED ORDER — RANITIDINE HCL 300 MG PO TABS: 150.0000 mg | ORAL_TABLET | Freq: Every day | ORAL | Status: DC

## 2015-09-01 NOTE — Patient Instructions (Signed)
Your mamomgram has been ordered  I haverefilled tramadol and alprazolam  Prednisone taper to see if hip pai nimporves

## 2015-09-01 NOTE — Progress Notes (Signed)
Pre-visit discussion using our clinic review tool. No additional management support is needed unless otherwise documented below in the visit note.  

## 2015-09-03 ENCOUNTER — Encounter: Payer: Self-pay | Admitting: Internal Medicine

## 2015-09-03 DIAGNOSIS — F4321 Adjustment disorder with depressed mood: Secondary | ICD-10-CM | POA: Insufficient documentation

## 2015-09-03 DIAGNOSIS — M25559 Pain in unspecified hip: Secondary | ICD-10-CM | POA: Insufficient documentation

## 2015-09-03 NOTE — Assessment & Plan Note (Signed)
Annual comprehensive preventive exam was done as well as an evaluation and management of chronic conditions .  During the course of the visit the patient was educated and counseled about appropriate screening and preventive services including :  diabetes screening, lipid analysis with projected  10 year  risk for CAD  Which is 8.9 % using the Framingham risk calculator for women, , nutrition counseling, colorectal cancer screening, and recommended immunizations.  Printed recommendations for health maintenance screenings was given.

## 2015-09-03 NOTE — Assessment & Plan Note (Signed)
Managed most nights with benadryl .  Recent death of sister Brenda Leon aggravating symptoms,  orn alprazolan prescribed. The risks and benefits of benzodiazepine use were discussed with patient today including excessive sedation leading to respiratory depression,  impaired thinking/driving, and addiction.  Patient was advised to avoid concurrent use with alcohol, to use medication only as needed and not to share with others  .

## 2015-09-03 NOTE — Assessment & Plan Note (Signed)
I have congratulated her in reduction of   BMI and encouraged  Continued weight loss with goal of 10% of body weigh over the next 6 months using a low glycemic index diet and regular exercise a minimum of 5 days per week.    

## 2015-09-03 NOTE — Progress Notes (Signed)
Patient ID: Brenda Leon, female    DOB: [birth year]  Age: 70 y.o. MRN: [id number]  The patient is here for annual nongyn physical  examination and management of other chronic and acute problems.   The risk factors are reflected in the social history.  The roster of all physicians providing medical care to patient - is listed in the Snapshot section of the chart.  Home safety : The patient has smoke detectors in the home. They wear seatbelts.  There are no firearms at home. There is no violence in the home.   There is no risks for hepatitis, STDs or HIV. There is no   history of blood transfusion. They have no travel history to infectious disease endemic areas of the world.  The patient has seen their dentist in the last six month. They have seen their eye doctor in the last year. T   They do not  have excessive sun exposure. Discussed the need for sun protection: hats, long sleeves and use of sunscreen if there is significant sun exposure.   Diet: the importance of a healthy diet is discussed. They do have a healthy diet.  The benefits of regular aerobic exercise were discussed. She walks 4 times per week ,  20 minutes.   Depression screen: there are no signs or vegative symptoms of depression- irritability, change in appetite, anhedonia, sadness/tearfullness.   The following portions of the patient's history were reviewed and updated as appropriate: allergies, current medications, past family history, past medical history,  past surgical history, past social history  and problem list.  Visual acuity was not assessed per patient preference since she has regular follow up with her ophthalmologist. Hearing and body mass index were assessed and reviewed.   During the course of the visit the patient was educated and counseled about appropriate screening and preventive services including : fall prevention , diabetes screening, nutrition counseling, colorectal cancer screening, and recommended  immunizations.    CC: The primary encounter diagnosis was Breast cancer screening. Diagnoses of Hip pain, left, Hyperlipidemia, Vitamin D deficiency, Insomnia secondary to anxiety, Obesity, Hyperlipidemia with target LDL less than 130, Grief reaction, and Routine general medical examination at a health care facility were also pertinent to this visit.   Follow up on hypertension: No longer taking lisinopril since she lost nearly 20 lbs. Ome BPs have been < 130/80 for the last 4 weeks.    2) Grief.  Her sister died of kidney failure in late April. She is devastatated but has been going to grief counselling.  Hospice  in [place]  .  Sleeping with the use of  Benadryl,  Requesting rx for  alprazolam.     3) Left hip pain .  Feels stiff in the morning,  hurts when she tries sleeping on left side .  Pain improves with movement  .  Relieved with tramadol,  Naproxen did not work as well and caused gastritis. No history of fall or unusual activity  4) Hyperlipidemia: she has been taking RYR twice daily since last visit.  Not fasting,  Ate a bagel this morning   5) Colon CA screening.  Does not want colonoscopy . Prefers noninvasive screening,  cologuard discussed   History Brenda Leon has a past medical history of Cataracts, bilateral; Thyroid disease; Arthritis; Chicken pox; Diverticulitis; GERD (gastroesophageal reflux disease); Elevated blood pressure reading; Lupus (HCC); SLE (systemic lupus erythematosus) ([place]); and Anemia.   She has past surgical history that includes Tonsillectomy and  adenoidectomy; Knee arthroscopy w/ ACL reconstruction; Appendectomy (1952); Eye surgery (1995); Breast biopsy (2004); Cataract extraction, bilateral (Bilateral, 1995, 1996); and Abdominal hysterectomy (1987).   Her family history includes Aneurysm in her mother; Diabetes in her sister; Heart attack in her father; Heart disease in her father; Hyperlipidemia in her brother, sister, sister, and son; Hypertension  in her father, sister, and son; Other in her mother.She reports that she quit smoking about 29 years ago. She does not have any smokeless tobacco history on file. She reports that she drinks about 7.0 oz of alcohol per week. She reports that she does not use illicit drugs.  Outpatient Prescriptions Prior to Visit  Medication Sig Dispense Refill  . acetaminophen (TYLENOL) 650 MG CR tablet Take 650 mg by mouth every 8 (eight) hours as needed.     Marland Kitchen b complex vitamins tablet Take 1 tablet by mouth daily.    . Doxylamine Succinate, Sleep, (SLEEP AID PO) Take 50 mg by mouth at bedtime.    . Flaxseed, Linseed, (FLAX SEED OIL PO) Take by mouth.    . Ginkgo Biloba 40 MG TABS Take 120 mg by mouth daily.     . Horse Chestnut 300 MG CAPS Take 1 capsule by mouth daily.    . Red Yeast Rice Extract 600 MG CAPS Take 1 capsule (600 mg total) by mouth daily. (Patient taking differently: Take 1 capsule by mouth 2 (two) times daily. ) 60 capsule 0  . ranitidine (ZANTAC) 300 MG tablet Take 1 tablet (300 mg total) by mouth at bedtime. (Patient taking differently: Take 150 mg by mouth at bedtime. ) 90 tablet 1  . naproxen (NAPROSYN) 250 MG tablet Take 220 mg by mouth daily after supper. Reported on 09/01/2015    . Nutritional Supplements (ESTROVEN MAXIMUM STRENGTH PO) Take 1 capsule by mouth daily. Reported on 09/01/2015    . ALPRAZolam (XANAX) 0.5 MG tablet TAKE ONE TABLET BY MOUTH AT BEDTIME AS NEEDED FOR ANXIETY (Patient not taking: Reported on 09/01/2015) 30 tablet 0  . aluminum hydroxide-magnesium carbonate (GAVISCON) 95-358 MG/15ML SUSP Take 15 mLs by mouth as needed.    Marland Kitchen lisinopril (PRINIVIL,ZESTRIL) 30 MG tablet TAKE ONE TABLET BY MOUTH ONCE DAILY (Patient not taking: Reported on 09/01/2015) 30 tablet 3  . nystatin ointment (MYCOSTATIN) Apply 1 application topically 2 (two) times daily. (Patient not taking: Reported on 09/01/2015) 30 g 0  . traMADol (ULTRAM) 50 MG tablet TAKE ONE TABLET BY MOUTH ONCE DAILY AS NEEDED FOR  MODERATE PAIN (Patient not taking: Reported on 09/01/2015) 30 tablet 0  . triamcinolone cream (KENALOG) 0.1 % Apply 1 application topically 2 (two) times daily. 30 g 0   No facility-administered medications prior to visit.    Review of Systems  Patient denies headache, fevers, malaise, unintentional weight loss, skin rash, eye pain, sinus congestion and sinus pain, sore throat, dysphagia,  hemoptysis , cough, dyspnea, wheezing, chest pain, palpitations, orthopnea, edema, abdominal pain, nausea, melena, diarrhea, constipation, flank pain, dysuria, hematuria, urinary  Frequency, nocturia, numbness, tingling, seizures,  Focal weakness, Loss of consciousness,  Tremor,  depression, anxiety, and suicidal ideation.      Objective:  BP 128/68 mmHg  Pulse 84  Temp(Src) 98.1 F (36.7 C) (Oral)  Resp 10  Ht 5\' 7"  (1.702 m)  Wt 176 lb 12 oz (80.173 kg)  BMI 27.68 kg/m2  SpO2 98%  Physical Exam   General appearance: alert, cooperative and appears stated age Ears: normal TM's and external ear canals both ears  Throat: lips, mucosa, and tongue normal; teeth and gums normal Neck: no adenopathy, no carotid bruit, supple, symmetrical, trachea midline and thyroid not enlarged, symmetric, no tenderness/mass/nodules Back: symmetric, no curvature. ROM normal. No CVA tenderness. Lungs: clear to auscultation bilaterally Heart: regular rate and rhythm, S1, S2 normal, no murmur, click, rub or gallop Abdomen: soft, non-tender; bowel sounds normal; no masses,  no organomegaly Pulses: 2+ and symmetric Skin: Skin color, texture, turgor normal. No rashes or lesions Lymph nodes: Cervical, supraclavicular, and axillary nodes normal. MSK: left hip with full ROM , non tender      Assessment & Plan:   Problem List Items Addressed This Visit    Hyperlipidemia with target LDL less than 130    Managed with twice daily RYR . 10 yr risk of CAD is 8.9%   Lab Results  Component Value Date   CHOL 204* 09/01/2015    CHOL 182 01/12/2014   CHOL 214* 06/25/2013   Lab Results  Component Value Date   HDL 56.90 09/01/2015   HDL 46.30 01/12/2014   HDL 45.80 06/25/2013   Lab Results  Component Value Date   LDLCALC 128* 09/01/2015   LDLCALC 118* 01/12/2014   LDLCALC 151* 06/25/2013   Lab Results  Component Value Date   TRIG 95.0 09/01/2015   TRIG 91.0 01/12/2014   TRIG 87.0 06/25/2013   Lab Results  Component Value Date   CHOLHDL 4 09/01/2015   CHOLHDL 4 01/12/2014   CHOLHDL 5 06/25/2013   Lab Results  Component Value Date   LDLDIRECT 167.8 02/14/2012        Routine general medical examination at a health care facility    Annual comprehensive preventive exam was done as well as an evaluation and management of chronic conditions .  During the course of the visit the patient was educated and counseled about appropriate screening and preventive services including :  diabetes screening, lipid analysis with projected  10 year  risk for CAD  Which is 8.9 % using the Framingham risk calculator for women, , nutrition counseling, colorectal cancer screening, and recommended immunizations.  Printed recommendations for health maintenance screenings was given.        Obesity    I have congratulated her in reduction of   BMI and encouraged  Continued weight loss with goal of 10% of body weigh over the next 6 months using a low glycemic index diet and regular exercise a minimum of 5 days per week.        Insomnia secondary to anxiety    Managed most nights with benadryl .  Recent death of sister Izora Gala aggravating symptoms,  orn alprazolan prescribed. The risks and benefits of benzodiazepine use were discussed with patient today including excessive sedation leading to respiratory depression,  impaired thinking/driving, and addiction.  Patient was advised to avoid concurrent use with alcohol, to use medication only as needed and not to share with others  .        Relevant Medications   ALPRAZolam (XANAX)  0.25 MG tablet   Hip pain    Trial of prednisone  For presumed bursitisi  And prn tramadol       Relevant Orders   DG HIP UNILAT WITH PELVIS 2-3 VIEWS LEFT (Completed)   Grief reaction    Patient is dealing with the unexpected loss of sister and has adequate coping skills and emotional support .  She is attendging grief counselling        Other Visit Diagnoses  Breast cancer screening    -  Primary    Relevant Orders    MM DIGITAL SCREENING BILATERAL    Hyperlipidemia        Relevant Orders    Comprehensive metabolic panel (Completed)    TSH (Completed)    Lipid panel (Completed)    Vitamin D deficiency        Relevant Orders    VITAMIN D 25 Hydroxy (Vit-D Deficiency, Fractures) (Completed)       I have discontinued Ms. Xue's aluminum hydroxide-magnesium carbonate, lisinopril, nystatin ointment, and triamcinolone cream. I have also changed her ranitidine. Additionally, I am having her start on predniSONE. Lastly, I am having her maintain her (Flaxseed, Linseed, (FLAX SEED OIL PO)), b complex vitamins, naproxen, Ginkgo Biloba, Red Yeast Rice Extract, (Doxylamine Succinate, Sleep, (SLEEP AID PO)), Horse Chestnut, Nutritional Supplements (ESTROVEN MAXIMUM STRENGTH PO), acetaminophen, traMADol, and ALPRAZolam.  Meds ordered this encounter  Medications  . DISCONTD: ALPRAZolam (XANAX) 0.25 MG tablet    Sig: Take 1 tablet (0.25 mg total) by mouth at bedtime as needed for anxiety.    Dispense:  60 tablet    Refill:  0    Refill for 30 days only.  OFFICE VISIT NEEDED prior to any more refills  NO EXCEPTIONS,  LAST REFILL  . traMADol (ULTRAM) 50 MG tablet    Sig: TAKE ONE TABLET BY MOUTH ONCE DAILY AS NEEDED FOR MODERATE PAIN    Dispense:  30 tablet    Refill:  5  . ranitidine (ZANTAC) 300 MG tablet    Sig: Take 0.5 tablets (150 mg total) by mouth at bedtime.    Dispense:  90 tablet    Refill:  1  . ALPRAZolam (XANAX) 0.25 MG tablet    Sig: Take 1 tablet (0.25 mg total) by  mouth at bedtime as needed for anxiety.    Dispense:  60 tablet    Refill:  3  . predniSONE (DELTASONE) 10 MG tablet    Sig: 6 tablets on Day 1 , then reduce by 1 tablet daily until gone    Dispense:  21 tablet    Refill:  0    Medications Discontinued During This Encounter  Medication Reason  . aluminum hydroxide-magnesium carbonate (GAVISCON) 95-358 MG/15ML SUSP Patient Preference  . triamcinolone cream (KENALOG) 0.1 % Completed Course  . lisinopril (PRINIVIL,ZESTRIL) 30 MG tablet Completed Course  . ALPRAZolam (XANAX) 0.5 MG tablet Reorder  . traMADol (ULTRAM) 50 MG tablet Reorder  . lisinopril (PRINIVIL,ZESTRIL) 30 MG tablet Completed Course  . nystatin ointment (MYCOSTATIN)   . ranitidine (ZANTAC) 300 MG tablet Reorder  . ALPRAZolam (XANAX) 0.25 MG tablet Reorder    Follow-up: No Follow-up on file.   Crecencio Mc, MD

## 2015-09-03 NOTE — Assessment & Plan Note (Signed)
Trial of prednisone  For presumed bursitisi  And prn tramadol

## 2015-09-03 NOTE — Assessment & Plan Note (Signed)
Managed with twice daily RYR . 10 yr risk of CAD is 8.9%   Lab Results  Component Value Date   CHOL 204* 09/01/2015   CHOL 182 01/12/2014   CHOL 214* 06/25/2013   Lab Results  Component Value Date   HDL 56.90 09/01/2015   HDL 46.30 01/12/2014   HDL 45.80 06/25/2013   Lab Results  Component Value Date   LDLCALC 128* 09/01/2015   LDLCALC 118* 01/12/2014   LDLCALC 151* 06/25/2013   Lab Results  Component Value Date   TRIG 95.0 09/01/2015   TRIG 91.0 01/12/2014   TRIG 87.0 06/25/2013   Lab Results  Component Value Date   CHOLHDL 4 09/01/2015   CHOLHDL 4 01/12/2014   CHOLHDL 5 06/25/2013   Lab Results  Component Value Date   LDLDIRECT 167.8 02/14/2012

## 2015-09-03 NOTE — Assessment & Plan Note (Addendum)
Patient is dealing with the unexpected loss of sister and has adequate coping skills and emotional support .  She is attendging grief counselling

## 2015-09-08 ENCOUNTER — Ambulatory Visit (INDEPENDENT_AMBULATORY_CARE_PROVIDER_SITE_OTHER): Payer: 59 | Admitting: Psychology

## 2015-09-08 DIAGNOSIS — F4323 Adjustment disorder with mixed anxiety and depressed mood: Secondary | ICD-10-CM

## 2015-09-20 ENCOUNTER — Encounter: Payer: Self-pay | Admitting: Internal Medicine

## 2015-09-21 NOTE — Telephone Encounter (Signed)
Patient sent e mail with following concerns, addressed below.   Please send cologuard requisition for patient. Mammogram was ordered July 7th. Please let patient know she can schedule her own since she has not heard from [place] yet   She can get the Hep C blood test next time she is due for labs (6 months) She can get the Pneumovax next time as well or make an RN visit for Pneumoax

## 2015-09-22 ENCOUNTER — Encounter: Payer: Self-pay | Admitting: Internal Medicine

## 2015-09-22 ENCOUNTER — Telehealth: Payer: Self-pay

## 2015-09-22 NOTE — Telephone Encounter (Signed)
Has tried tylenol?  There is nothing between tylenol and tramadol for pain relief. Everything else is an anti inflammatory , wo if she can tolerate motrin,  I would try 800 mg Motrin and 500 mg tylenol twice daily

## 2015-09-22 NOTE — Telephone Encounter (Signed)
Patient sent a return mychart and can you please advise for her:   Would you ask Dr. Derrel Nip if she would prescribe a pain med to take in the morning.   The Tramadol helps a lot with sleep, but hip pain is getting worse and causing referred pain down the left leg to the knee!  The prednisone didn't help much and the pain is affecting my gait, mobility, and range of motion.   I  don't see the ortho PA until Aug 30th.  Thanks

## 2015-09-29 ENCOUNTER — Ambulatory Visit (INDEPENDENT_AMBULATORY_CARE_PROVIDER_SITE_OTHER): Payer: 59 | Admitting: Psychology

## 2015-09-29 DIAGNOSIS — F4323 Adjustment disorder with mixed anxiety and depressed mood: Secondary | ICD-10-CM

## 2015-10-03 ENCOUNTER — Encounter: Payer: Self-pay | Admitting: Internal Medicine

## 2015-10-03 DIAGNOSIS — Z1211 Encounter for screening for malignant neoplasm of colon: Secondary | ICD-10-CM | POA: Diagnosis not present

## 2015-10-03 DIAGNOSIS — Z1212 Encounter for screening for malignant neoplasm of rectum: Secondary | ICD-10-CM | POA: Diagnosis not present

## 2015-10-03 LAB — COLOGUARD: Cologuard: NEGATIVE

## 2015-10-06 ENCOUNTER — Encounter: Payer: Self-pay | Admitting: Internal Medicine

## 2015-10-06 MED ORDER — TRAMADOL HCL 50 MG PO TABS: 50.0000 mg | ORAL_TABLET | Freq: Two times a day (BID) | ORAL | 3 refills | Status: DC | PRN

## 2015-10-12 ENCOUNTER — Telehealth: Payer: Self-pay | Admitting: Internal Medicine

## 2015-10-12 NOTE — Telephone Encounter (Signed)
Received cologuard results. In results folder.

## 2015-10-13 ENCOUNTER — Ambulatory Visit
Admission: RE | Admit: 2015-10-13 | Discharge: 2015-10-13 | Disposition: A | Discharge status: Home / Self Care | Payer: Medicare Other | Source: Ambulatory Visit | Attending: Internal Medicine | Admitting: Internal Medicine

## 2015-10-13 ENCOUNTER — Ambulatory Visit (INDEPENDENT_AMBULATORY_CARE_PROVIDER_SITE_OTHER): Payer: 59 | Admitting: Psychology

## 2015-10-13 DIAGNOSIS — F4323 Adjustment disorder with mixed anxiety and depressed mood: Secondary | ICD-10-CM

## 2015-10-13 DIAGNOSIS — Z1239 Encounter for other screening for malignant neoplasm of breast: Secondary | ICD-10-CM

## 2015-10-13 NOTE — Telephone Encounter (Signed)
Patient notified and voiced understanding.

## 2015-10-13 NOTE — Telephone Encounter (Signed)
The results of patient's cologuard is negative.   We will repeat every 3 years For colon CA screening.  

## 2015-10-17 ENCOUNTER — Other Ambulatory Visit: Payer: Self-pay | Admitting: Internal Medicine

## 2015-10-17 ENCOUNTER — Ambulatory Visit
Admission: RE | Admit: 2015-10-17 | Discharge: 2015-10-17 | Disposition: A | Discharge status: Home / Self Care | Payer: Medicare Other | Source: Ambulatory Visit | Attending: Internal Medicine | Admitting: Internal Medicine

## 2015-10-17 DIAGNOSIS — R928 Other abnormal and inconclusive findings on diagnostic imaging of breast: Secondary | ICD-10-CM | POA: Insufficient documentation

## 2015-10-17 DIAGNOSIS — Z1231 Encounter for screening mammogram for malignant neoplasm of breast: Secondary | ICD-10-CM

## 2015-10-18 ENCOUNTER — Other Ambulatory Visit: Payer: Self-pay | Admitting: Internal Medicine

## 2015-10-18 DIAGNOSIS — N6310 Unspecified lump in the right breast, unspecified quadrant: Secondary | ICD-10-CM

## 2015-10-19 ENCOUNTER — Ambulatory Visit (INDEPENDENT_AMBULATORY_CARE_PROVIDER_SITE_OTHER): Payer: Medicare Other

## 2015-10-19 VITALS — BP 118/64 | HR 77 | Temp 97.9°F | Resp 12 | Ht 67.0 in | Wt 179.0 lb

## 2015-10-19 DIAGNOSIS — Z0000 Encounter for general adult medical examination without abnormal findings: Secondary | ICD-10-CM | POA: Diagnosis not present

## 2015-10-19 DIAGNOSIS — Z1159 Encounter for screening for other viral diseases: Secondary | ICD-10-CM

## 2015-10-19 DIAGNOSIS — Z23 Encounter for immunization: Secondary | ICD-10-CM

## 2015-10-19 NOTE — Patient Instructions (Addendum)
Brenda Leon , Thank you for taking time to come for your Medicare Wellness Visit. I appreciate your ongoing commitment to your health goals. Please review the following plan we discussed and let me know if I can assist you in the future.   These are the goals we discussed: Goals    . Increase water intake          STAY HYDRATED AND DRINK PLENTY OF FLUIDS.         This is a list of the screening recommended for you and due dates:  Health Maintenance  Topic Date Due  . Colon Cancer Screening  02/10/2015  . Mammogram  10/16/2017  . Tetanus Vaccine  04/12/2020  . Flu Shot  Completed  . DEXA scan (bone density measurement)  Completed  . Shingles Vaccine  Completed  .  Hepatitis C: One time screening is recommended by Center for Disease Control  (CDC) for  adults born from [birth year] through 1964.   Completed  . Pneumonia vaccines  Completed    Hearing Loss Hearing loss is a partial or total loss of the ability to hear. This can be temporary or permanent, and it can happen in one or both ears. Hearing loss may be referred to as deafness. Medical care is necessary to treat hearing loss properly and to prevent the condition from getting worse. Your hearing may partially or completely come back, depending on what caused your hearing loss and how severe it is. In some cases, hearing loss is permanent. CAUSES Common causes of hearing loss include:   Too much wax in the ear canal.   Infection of the ear canal or middle ear.   Fluid in the middle ear.   Injury to the ear or surrounding area.   An object stuck in the ear.   Prolonged exposure to loud sounds, such as music.  Less common causes of hearing loss include:   Tumors in the ear.   Viral or bacterial infections, such as meningitis.   A hole in the eardrum (perforated eardrum).  Problems with the hearing nerve that sends signals between the brain and the ear.  Certain medicines.  SYMPTOMS  Symptoms of this condition  may include:  Difficulty telling the difference between sounds.  Difficulty following a conversation when there is background noise.  Lack of response to sounds in your environment. This may be most noticeable when you do not respond to startling sounds.  Needing to turn up the volume on the television, radio, etc.  Ringing in the ears.  Dizziness.  Pain in the ears. DIAGNOSIS This condition is diagnosed based on a physical exam and a hearing test (audiometry). The audiometry test will be performed by a hearing specialist (audiologist). You may also be referred to an ear, nose, and throat (ENT) specialist (otolaryngologist).  TREATMENT Treatment for recent onset of hearing loss may include:   Ear wax removal.   Being prescribed medicines to prevent infection (antibiotics).   Being prescribed medicines to reduce inflammation (corticosteroids).  HOME CARE INSTRUCTIONS  If you were prescribed an antibiotic medicine, take it as told by your health care provider. Do not stop taking the antibiotic even if you start to feel better.  Take over-the-counter and prescription medicines only as told by your health care provider.  Avoid loud noises.   Return to your normal activities as told by your health care provider. Ask your health care provider what activities are safe for you.  Keep all follow-up visits  as told by your health care provider. This is important. SEEK MEDICAL CARE IF:   You feel dizzy.   You develop new symptoms.   You vomit or feel nauseous.   You have a fever.  SEEK IMMEDIATE MEDICAL CARE IF:  You develop sudden changes in your vision.   You have severe ear pain.   You have new or increased weakness.  You have a severe headache.   This information is not intended to replace advice given to you by your health care provider. Make sure you discuss any questions you have with your health care provider.   Document Released: 02/11/2005 Document  Revised: 11/02/2014 Document Reviewed: 06/29/2014 Elsevier Interactive Patient Education [employer].

## 2015-10-19 NOTE — Progress Notes (Signed)
Subjective:   Brenda Leon is a 70 y.o. female who presents for Medicare Annual (Subsequent) preventive examination.  Review of Systems:  No ROS.  Medicare Wellness Visit.  Cardiac Risk Factors include: advanced age (>60men, >71 women);hypertension     Objective:     Vitals: BP 118/64 (BP Location: Right Arm, Patient Position: Sitting, Cuff Size: Normal)   Pulse 77   Temp 97.9 F (36.6 C) (Oral)   Resp 12   Ht 5\' 7"  (1.702 m)   Wt 179 lb (81.2 kg)   SpO2 97%   BMI 28.04 kg/m   Body mass index is 28.04 kg/m.   Tobacco History  Smoking Status  . Former Smoker  . Quit date: 02/25/1986  Smokeless Tobacco  . Never Used     Counseling given: Not Answered   Past Medical History:  Diagnosis Date  . Anemia   . Arthritis   . Cataracts, bilateral   . Chicken pox   . Diverticulitis   . Elevated blood pressure reading   . GERD (gastroesophageal reflux disease)   . Lupus ([place])   . SLE (systemic lupus erythematosus) ([place])    diagnosed with skin biopsy and serology  . Thyroid disease    Past Surgical History:  Procedure Laterality Date  . ABDOMINAL HYSTERECTOMY  1987  . APPENDECTOMY  1952  . BREAST CYST ASPIRATION Right 25 + yrs ago   neg  . CATARACT EXTRACTION, BILATERAL Bilateral 1995, 1996  . [place]   x2 1995 and 1996  . KNEE ARTHROSCOPY W/ ACL RECONSTRUCTION    . TONSILLECTOMY AND ADENOIDECTOMY     Family History  Problem Relation Age of Onset  . Aneurysm Mother     AAA  . Other Mother     varicose veins  . Hypertension Father   . Heart disease Father   . Heart attack Father   . Diabetes Sister   . Hyperlipidemia Sister   . Hypertension Sister   . Kidney disease Sister   . Hyperlipidemia Brother   . Hypertension Son   . Hyperlipidemia Son   . Hyperlipidemia Sister    History  Sexual Activity  . Sexual activity: Yes    Outpatient Encounter Prescriptions as of 10/19/2015  Medication Sig  . ALPRAZolam (XANAX) 0.25 MG tablet Take 1  tablet (0.25 mg total) by mouth at bedtime as needed for anxiety.  Marland Kitchen b complex vitamins tablet Take 1 tablet by mouth daily.  . Cholecalciferol (VITAMIN D3) 1000 units CAPS Take by mouth.  . Doxylamine Succinate, Sleep, (SLEEP AID PO) Take 50 mg by mouth at bedtime.  . Flaxseed, Linseed, (FLAX SEED OIL PO) Take by mouth.  . Horse Chestnut 300 MG CAPS Take 1 capsule by mouth daily.  . Nutritional Supplements (ESTROVEN MAXIMUM STRENGTH PO) Take 1 capsule by mouth daily. Reported on 09/01/2015  . ranitidine (ZANTAC) 300 MG tablet Take 0.5 tablets (150 mg total) by mouth at bedtime.  . Red Yeast Rice Extract 600 MG CAPS Take 1 capsule (600 mg total) by mouth daily. (Patient taking differently: Take 1 capsule by mouth 2 (two) times daily. )  . traMADol (ULTRAM) 50 MG tablet Take 1 tablet (50 mg total) by mouth every 12 (twelve) hours as needed. TAKE ONE TABLET BY MOUTH ONCE DAILY AS NEEDED FOR MODERATE PAIN  . [DISCONTINUED] acetaminophen (TYLENOL) 650 MG CR tablet Take 650 mg by mouth every 8 (eight) hours as needed.   . [DISCONTINUED] Ginkgo Biloba 40 MG  TABS Take 120 mg by mouth daily.   . [DISCONTINUED] naproxen (NAPROSYN) 250 MG tablet Take 220 mg by mouth daily after supper. Reported on 09/01/2015  . [DISCONTINUED] predniSONE (DELTASONE) 10 MG tablet 6 tablets on Day 1 , then reduce by 1 tablet daily until gone   No facility-administered encounter medications on file as of 10/19/2015.     Activities of Daily Living In your present state of health, do you have any difficulty performing the following activities: 10/19/2015  Hearing? N  Vision? N  Difficulty concentrating or making decisions? Y  Walking or climbing stairs? N  Dressing or bathing? N  Doing errands, shopping? N  Preparing Food and eating ? N  Using the Toilet? N  In the past six months, have you accidently leaked urine? N  Do you have problems with loss of bowel control? N  Managing your Medications? N  Managing your Finances? N   Housekeeping or managing your Housekeeping? N  Some recent data might be hidden    Patient Care Team: Crecencio Mc, MD as PCP - General (Internal Medicine)    Assessment:    This is a routine wellness examination for Brenda Leon. The goal of the wellness visit is to assist the patient how to close the gaps in care and create a preventative care plan for the patient.   Taking calcium VIT D3 as appropriate/Osteoporosis risk reviewed.  Medications reviewed; taking without issues or barriers.  Safety issues reviewed; smoke and carbon monoxide detectors in the home. Firearms in a safe within the home. Wears seatbelts when driving or riding with others. No violence in the home.  No identified risk were noted; The patient was oriented x 3; appropriate in dress and manner and no objective failures at ADL's or IADL's.   Body mass index; discussed the importance of a healthy diet, water intake and exercise. Educational material provided.  Influenza vaccine administered, R deltoid.  Tolerated well.  Educational material provided.  Prevnar 13 administered, L deltoid.  Tolerated well.  Educational material provided.  Hepatitis C antibody screening completed, per patient request.  Educational material provided.   Patient Concerns: None at this time. Follow up with PCP as needed.  Exercise Activities and Dietary recommendations Current Exercise Habits: Home exercise routine, Type of exercise: walking, Time (Minutes): 30, Frequency (Times/Week): 7, Weekly Exercise (Minutes/Week): 210, Intensity: Mild  Goals    . Increase water intake          STAY HYDRATED AND DRINK PLENTY OF FLUIDS.        Fall Risk Fall Risk  10/19/2015 07/02/2013 02/10/2012  Falls in the past year? No Yes No   Depression Screen PHQ 2/9 Scores 10/19/2015 07/02/2013 02/10/2012  PHQ - 2 Score 3 1 1   PHQ- 9 Score 6 - -     Cognitive Testing MMSE - Mini Mental State Exam 10/19/2015  Orientation to time 5    Orientation to Place 5  Registration 3  Attention/ Calculation 5  Recall 3  Language- name 2 objects 2  Language- repeat 1  Language- follow 3 step command 3  Language- read & follow direction 1  Write a sentence 1  Copy design 1  Total score 30    Immunization History  Administered Date(s) Administered  . Influenza Split 12/11/2011  . Influenza,inj,Quad PF,36+ Mos 02/23/2013, 01/05/2014, 11/03/2014, 10/19/2015  . Pneumococcal Conjugate-13 10/19/2015  . Pneumococcal Polysaccharide-23 02/10/2012  . Tdap 04/12/2010  . Zoster 01/15/2013   Screening Tests Health Maintenance  Topic Date Due  . COLONOSCOPY  02/10/2015  . MAMMOGRAM  10/16/2017  . TETANUS/TDAP  04/12/2020  . INFLUENZA VACCINE  Completed  . DEXA SCAN  Completed  . ZOSTAVAX  Completed  . Hepatitis C Screening  Completed  . PNA vac Low Risk Adult  Completed      Plan:    End of life planning; Advance aging; Advanced directives discussed. No HCPOA/Living Will on file.  Educational material declined.  States she has AD material (5 wishes) and will follow up with PCP when ready to pursue.      During the course of the visit the patient was educated and counseled about the following appropriate screening and preventive services:   Vaccines to include Pneumoccal, Influenza, Hepatitis B, Td, Zostavax, HCV  Electrocardiogram  Cardiovascular Disease  Colorectal cancer screening  Bone density screening  Diabetes screening  Glaucoma screening  Mammography/PAP  Nutrition counseling   Patient Instructions (the written plan) was given to the patient.   Varney Biles, LPN  [id number]

## 2015-10-20 LAB — HEPATITIS C ANTIBODY: HCV AB: NEGATIVE

## 2015-10-20 NOTE — Progress Notes (Signed)
  I have reviewed the above information and agree with above.   Doroteo Nickolson, MD 

## 2015-10-22 ENCOUNTER — Encounter: Payer: Self-pay | Admitting: Internal Medicine

## 2015-10-25 ENCOUNTER — Encounter: Payer: Self-pay | Admitting: Internal Medicine

## 2015-10-25 DIAGNOSIS — M1612 Unilateral primary osteoarthritis, left hip: Secondary | ICD-10-CM | POA: Diagnosis not present

## 2015-11-02 ENCOUNTER — Other Ambulatory Visit: Payer: Self-pay | Admitting: Internal Medicine

## 2015-11-02 ENCOUNTER — Ambulatory Visit
Admission: RE | Admit: 2015-11-02 | Discharge: 2015-11-02 | Disposition: A | Discharge status: Home / Self Care | Payer: Medicare Other | Source: Ambulatory Visit | Attending: Internal Medicine | Admitting: Internal Medicine

## 2015-11-02 DIAGNOSIS — N63 Unspecified lump in breast: Secondary | ICD-10-CM | POA: Diagnosis not present

## 2015-11-02 DIAGNOSIS — N6310 Unspecified lump in the right breast, unspecified quadrant: Secondary | ICD-10-CM

## 2015-11-02 DIAGNOSIS — N6001 Solitary cyst of right breast: Secondary | ICD-10-CM | POA: Insufficient documentation

## 2015-11-03 ENCOUNTER — Encounter: Payer: Self-pay | Admitting: Internal Medicine

## 2015-11-03 DIAGNOSIS — N6009 Solitary cyst of unspecified breast: Secondary | ICD-10-CM

## 2015-11-03 DIAGNOSIS — R928 Other abnormal and inconclusive findings on diagnostic imaging of breast: Secondary | ICD-10-CM

## 2015-11-06 ENCOUNTER — Encounter: Payer: Self-pay | Admitting: General Surgery

## 2015-11-07 DIAGNOSIS — M25552 Pain in left hip: Secondary | ICD-10-CM | POA: Diagnosis not present

## 2015-11-21 ENCOUNTER — Ambulatory Visit (INDEPENDENT_AMBULATORY_CARE_PROVIDER_SITE_OTHER): Payer: Medicare Other | Admitting: General Surgery

## 2015-11-21 ENCOUNTER — Encounter: Payer: Self-pay | Admitting: General Surgery

## 2015-11-21 ENCOUNTER — Other Ambulatory Visit: Payer: Self-pay

## 2015-11-21 VITALS — BP 132/70 | HR 76 | Resp 14 | Ht 68.0 in | Wt 179.0 lb

## 2015-11-21 DIAGNOSIS — N63 Unspecified lump in breast: Secondary | ICD-10-CM

## 2015-11-21 DIAGNOSIS — R1031 Right lower quadrant pain: Secondary | ICD-10-CM | POA: Insufficient documentation

## 2015-11-21 DIAGNOSIS — N6310 Unspecified lump in the right breast, unspecified quadrant: Secondary | ICD-10-CM | POA: Insufficient documentation

## 2015-11-21 NOTE — Patient Instructions (Addendum)
The patient is aware to call back for any questions or concerns. Schedule ultrasound  The patient is scheduled for a pelvic and transvaginal ultrasound at [facility] on 11/30/15 at 11:00 am. She will arrive by 10:45 am and will need to have a full bladder. The patient is aware of date, time, and instructions.

## 2015-11-21 NOTE — Progress Notes (Signed)
Patient ID: Brenda Leon, female   DOB: September 04, 1945, 70 y.o.   MRN: [id number]  Chief Complaint  Patient presents with  . Breast Problem    mammogram    HPI Brenda Leon is a 70 y.o. female. who presents for a breast evaluation. The most recent mammogram was done on 10-17-15 .  Patient does perform regular self breast checks and gets regular mammograms done.  She did have a right breast ultrasound on 11-02-15. She does have "jabs of pain" in both breast, mostly the right for several months. Denies any masses or trauma to the breast. She has lost 20 pounds since the passing of her sister to kidney failure in April. She does admit to right lower abdominal pain "jabs" for about 2-3 weeks. She feels it may be an ovarian cyst. The pain last 15-20 seconds, occurring several times but not every day. Denies diarrhea, constipation or blood in stool. Cologuard test 10-03-15 negative.  HPI  Past Medical History:  Diagnosis Date  . Anemia   . Arthritis   . Cataracts, bilateral   . Chicken pox   . Diverticulitis   . Elevated blood pressure reading   . GERD (gastroesophageal reflux disease)   . Lupus ([place])   . SLE (systemic lupus erythematosus) ([place])    diagnosed with skin biopsy and serology  . Thyroid disease     Past Surgical History:  Procedure Laterality Date  . ABDOMINAL HYSTERECTOMY  1987  . APPENDECTOMY  1952  . BREAST CYST ASPIRATION Right 25 + yrs ago   neg  . CATARACT EXTRACTION, BILATERAL Bilateral 1995, 1996  . [place]   x2 1995 and 1996  . KNEE ARTHROSCOPY W/ ACL RECONSTRUCTION    . TONSILLECTOMY AND ADENOIDECTOMY      Family History  Problem Relation Age of Onset  . Aneurysm Mother     AAA  . Other Mother     varicose veins  . Hypertension Father   . Heart disease Father   . Heart attack Father   . Diabetes Sister   . Hyperlipidemia Sister   . Hypertension Sister   . Kidney disease Sister   . Hyperlipidemia Brother   . Hypertension Son   .  Hyperlipidemia Son   . Hyperlipidemia Sister   . Breast cancer Neg Hx     Social History Social History  Substance Use Topics  . Smoking status: Former Smoker    Quit date: 02/25/1986  . Smokeless tobacco: Never Used  . Alcohol use 7.0 oz/week    14 Standard drinks or equivalent per week     Comment: 2 beers daily    No Known Allergies  Current Outpatient Prescriptions  Medication Sig Dispense Refill  . ALPRAZolam (XANAX) 0.25 MG tablet Take 1 tablet (0.25 mg total) by mouth at bedtime as needed for anxiety. 60 tablet 3  . b complex vitamins tablet Take 1 tablet by mouth daily.    . Cholecalciferol (VITAMIN D3) 1000 units CAPS Take by mouth.    . Doxylamine Succinate, Sleep, (SLEEP AID PO) Take 50 mg by mouth at bedtime.    . Flaxseed, Linseed, (FLAX SEED OIL PO) Take by mouth.    . Horse Chestnut 300 MG CAPS Take 1 capsule by mouth daily.    . ranitidine (ZANTAC) 300 MG tablet Take 0.5 tablets (150 mg total) by mouth at bedtime. 90 tablet 1  . Red Yeast Rice Extract 600 MG CAPS Take 1 capsule (600 mg total)  by mouth daily. (Patient taking differently: Take 1 capsule by mouth 2 (two) times daily. ) 60 capsule 0  . traMADol (ULTRAM) 50 MG tablet Take 1 tablet (50 mg total) by mouth every 12 (twelve) hours as needed. TAKE ONE TABLET BY MOUTH ONCE DAILY AS NEEDED FOR MODERATE PAIN 60 tablet 3   No current facility-administered medications for this visit.     Review of Systems Review of Systems  Constitutional: Positive for unexpected weight change.  Respiratory: Negative.   Cardiovascular: Negative.   Gastrointestinal: Negative for blood in stool, constipation, diarrhea and nausea.    Blood pressure 132/70, pulse 76, resp. rate 14, height 5\' 8"  (1.727 m), weight 179 lb (81.2 kg).  Physical Exam Physical Exam  Constitutional: She is oriented to person, place, and time. She appears well-developed and well-nourished.  HENT:  Mouth/Throat: Oropharynx is clear and moist.  Eyes:  Conjunctivae are normal. No scleral icterus.  Neck: Neck supple.  Cardiovascular: Normal rate, regular rhythm and normal heart sounds.   Pulmonary/Chest: Effort normal and breath sounds normal. Right breast exhibits no inverted nipple, no mass, no nipple discharge, no skin change and no tenderness. Left breast exhibits no inverted nipple, no mass, no nipple discharge, no skin change and no tenderness.    Left breast fuller than right breast.   Abdominal: Soft. Normal appearance and bowel sounds are normal. There is no tenderness.    Lymphadenopathy:    She has no cervical adenopathy.  Neurological: She is alert and oriented to person, place, and time.  Skin: Skin is warm and dry.  Psychiatric: Her behavior is normal.    Data Reviewed Bilateral mammograms dated 10/17/2015 were reviewed. New density in the right lower outer quadrant.  Ultrasound dated 11/02/2015 showed a 5 x 5 x 6 mm nodule with 1 septation. Thought to be represent a complicated cyst. BI-RADS-3.  Patient was offered and accepted cyst aspiration. The area of the 6:30 o'clock position the right breast 6 m from the nipple showed a well-defined hypoechoic mass with faint posterior acoustic shadowing and at least 1 septation. No vascular flow noted. This measured 0.54 x 0.60 x 0.58 cm. Making use of 1 mL of 1% plain Xylocaine the area was aspirated with complete resolution. The small amount of clear fluid was discarded. The procedure was well tolerated. BI-RADS-1.  Assessment    Simple cyst of the right breast, resolved on aspiration.   Episodic right lower quadrant pain, long status post appendectomy. Recently negative regard test.    Plan    The patient has previously undergone appendectomy as well as hysterectomy. Ovaries remain in place. This is unlikely related to ovarian process as the location of her discomfort is slightly high, but a pelvic ultrasound is good starting point. This is been arranged for early next  month [place]. If this is negative consideration can be given to CT scanning, colonoscopy or observation. The patient will follow-up with Dr. Derrel Nip in this regard.     Ultrasound to evaluate right lower quadrant pain.  The patient is scheduled for a pelvic and transvaginal ultrasound at [facility] on 11/30/15 at 11:00 am. She will arrive by 10:45 am and will need to have a full bladder. The patient is aware of date, time, and instructions.   This information has been scribed by Karie Fetch RN, BSN,BC.   Robert Bellow 11/21/2015, 10:08 PM

## 2015-11-27 ENCOUNTER — Encounter: Payer: Self-pay | Admitting: Internal Medicine

## 2015-11-27 ENCOUNTER — Encounter: Payer: Self-pay | Admitting: General Surgery

## 2015-11-30 ENCOUNTER — Ambulatory Visit
Admission: RE | Admit: 2015-11-30 | Discharge: 2015-11-30 | Disposition: A | Discharge status: Home / Self Care | Payer: Medicare Other | Source: Ambulatory Visit | Attending: General Surgery | Admitting: General Surgery

## 2015-11-30 DIAGNOSIS — Z90710 Acquired absence of both cervix and uterus: Secondary | ICD-10-CM | POA: Insufficient documentation

## 2015-11-30 DIAGNOSIS — R1031 Right lower quadrant pain: Secondary | ICD-10-CM | POA: Insufficient documentation

## 2015-12-01 ENCOUNTER — Telehealth: Payer: Self-pay

## 2015-12-01 NOTE — Telephone Encounter (Signed)
-----   Message from Robert Bellow, MD sent at 11/30/2015  4:01 PM EDT -----  Please notify the patient that the ultrasound of the pelvis did not show any abnormalities of her ovaries. If her symptoms persist she should follow up with Dr.Tullo. Thanks. ----- Message ----- From: Interface, Rad Results In Sent: 11/30/2015   1:17 PM To: Robert Bellow, MD

## 2015-12-01 NOTE — Telephone Encounter (Signed)
Notified patient as instructed, patient pleased. Discussed follow-up with Dr Derrel Nip for any further symptoms, patient agrees.

## 2015-12-27 ENCOUNTER — Encounter: Payer: Self-pay | Admitting: Internal Medicine

## 2016-03-06 ENCOUNTER — Encounter: Payer: Self-pay | Admitting: Internal Medicine

## 2016-03-15 DIAGNOSIS — D3132 Benign neoplasm of left choroid: Secondary | ICD-10-CM | POA: Diagnosis not present

## 2016-03-15 DIAGNOSIS — H524 Presbyopia: Secondary | ICD-10-CM | POA: Diagnosis not present

## 2016-03-15 DIAGNOSIS — H353131 Nonexudative age-related macular degeneration, bilateral, early dry stage: Secondary | ICD-10-CM | POA: Diagnosis not present

## 2016-03-15 DIAGNOSIS — Z961 Presence of intraocular lens: Secondary | ICD-10-CM | POA: Diagnosis not present

## 2016-04-09 ENCOUNTER — Encounter: Payer: Self-pay | Admitting: Internal Medicine

## 2016-04-23 ENCOUNTER — Ambulatory Visit (INDEPENDENT_AMBULATORY_CARE_PROVIDER_SITE_OTHER): Payer: Medicare HMO | Admitting: Psychology

## 2016-04-23 DIAGNOSIS — R69 Illness, unspecified: Secondary | ICD-10-CM | POA: Diagnosis not present

## 2016-04-23 DIAGNOSIS — F4323 Adjustment disorder with mixed anxiety and depressed mood: Secondary | ICD-10-CM | POA: Diagnosis not present

## 2016-06-14 DIAGNOSIS — D220 Melanocytic nevi of lip: Secondary | ICD-10-CM | POA: Diagnosis not present

## 2016-06-14 DIAGNOSIS — D225 Melanocytic nevi of trunk: Secondary | ICD-10-CM | POA: Diagnosis not present

## 2016-06-14 DIAGNOSIS — D2262 Melanocytic nevi of left upper limb, including shoulder: Secondary | ICD-10-CM | POA: Diagnosis not present

## 2016-06-14 DIAGNOSIS — L298 Other pruritus: Secondary | ICD-10-CM | POA: Diagnosis not present

## 2016-06-14 DIAGNOSIS — D2261 Melanocytic nevi of right upper limb, including shoulder: Secondary | ICD-10-CM | POA: Diagnosis not present

## 2016-06-14 DIAGNOSIS — L7211 Pilar cyst: Secondary | ICD-10-CM | POA: Diagnosis not present

## 2016-08-08 ENCOUNTER — Ambulatory Visit (INDEPENDENT_AMBULATORY_CARE_PROVIDER_SITE_OTHER): Payer: Medicare HMO | Admitting: Psychology

## 2016-08-08 DIAGNOSIS — F4323 Adjustment disorder with mixed anxiety and depressed mood: Secondary | ICD-10-CM

## 2016-08-08 DIAGNOSIS — R69 Illness, unspecified: Secondary | ICD-10-CM | POA: Diagnosis not present

## 2016-08-22 ENCOUNTER — Ambulatory Visit (INDEPENDENT_AMBULATORY_CARE_PROVIDER_SITE_OTHER): Payer: Medicare HMO | Admitting: Psychology

## 2016-08-22 DIAGNOSIS — F4323 Adjustment disorder with mixed anxiety and depressed mood: Secondary | ICD-10-CM

## 2016-08-22 DIAGNOSIS — R69 Illness, unspecified: Secondary | ICD-10-CM | POA: Diagnosis not present

## 2016-09-12 ENCOUNTER — Ambulatory Visit (INDEPENDENT_AMBULATORY_CARE_PROVIDER_SITE_OTHER): Payer: Medicare HMO | Admitting: Psychology

## 2016-09-12 DIAGNOSIS — F4323 Adjustment disorder with mixed anxiety and depressed mood: Secondary | ICD-10-CM

## 2016-09-12 DIAGNOSIS — R69 Illness, unspecified: Secondary | ICD-10-CM | POA: Diagnosis not present

## 2016-10-07 ENCOUNTER — Other Ambulatory Visit: Payer: Self-pay | Admitting: Internal Medicine

## 2016-10-08 NOTE — Telephone Encounter (Signed)
Last OV was 09/01/2015, last refill was 10/06/2015 #60 with 3 refills. Please advise, no follow up appt.

## 2016-10-08 NOTE — Telephone Encounter (Signed)
REFILL DENIED  MUST BE SEEN FOR REFILLS ON CONTROLLED SUBSTANCES.

## 2016-10-10 ENCOUNTER — Ambulatory Visit: Payer: Medicare HMO | Admitting: Psychology

## 2016-10-10 ENCOUNTER — Other Ambulatory Visit: Payer: Self-pay | Admitting: Internal Medicine

## 2016-10-11 NOTE — Telephone Encounter (Signed)
Last Appt with you was 09/01/15,  Last refill was in August of 2017.  Please advise, no upcoming appt. thanks

## 2016-10-11 NOTE — Telephone Encounter (Signed)
Second refusal.  No refills without OV  ,  Controlled substance!

## 2016-10-18 ENCOUNTER — Ambulatory Visit: Payer: Self-pay

## 2016-11-25 NOTE — Telephone Encounter (Signed)
Error

## 2017-01-10 ENCOUNTER — Encounter: Payer: Self-pay | Admitting: Internal Medicine

## 2017-01-15 ENCOUNTER — Encounter: Payer: Self-pay | Admitting: Internal Medicine

## 2017-01-18 ENCOUNTER — Other Ambulatory Visit: Payer: Self-pay | Admitting: Internal Medicine

## 2017-01-18 DIAGNOSIS — Z1239 Encounter for other screening for malignant neoplasm of breast: Secondary | ICD-10-CM

## 2017-01-18 NOTE — Progress Notes (Signed)
Mammogram , screening,  Ordered.

## 2017-02-28 ENCOUNTER — Other Ambulatory Visit: Payer: Self-pay | Admitting: Internal Medicine

## 2017-02-28 ENCOUNTER — Ambulatory Visit
Admission: RE | Admit: 2017-02-28 | Discharge: 2017-02-28 | Disposition: A | Discharge status: Home / Self Care | Payer: Medicare HMO | Source: Ambulatory Visit | Attending: Internal Medicine | Admitting: Internal Medicine

## 2017-02-28 DIAGNOSIS — Z1231 Encounter for screening mammogram for malignant neoplasm of breast: Secondary | ICD-10-CM | POA: Diagnosis not present

## 2017-02-28 DIAGNOSIS — N6489 Other specified disorders of breast: Secondary | ICD-10-CM | POA: Insufficient documentation

## 2017-02-28 DIAGNOSIS — Z1239 Encounter for other screening for malignant neoplasm of breast: Secondary | ICD-10-CM

## 2017-02-28 DIAGNOSIS — R928 Other abnormal and inconclusive findings on diagnostic imaging of breast: Secondary | ICD-10-CM | POA: Diagnosis not present

## 2017-03-02 ENCOUNTER — Encounter: Payer: Self-pay | Admitting: Internal Medicine

## 2017-03-13 ENCOUNTER — Ambulatory Visit
Admission: RE | Admit: 2017-03-13 | Discharge: 2017-03-13 | Disposition: A | Discharge status: Home / Self Care | Payer: Medicare HMO | Source: Ambulatory Visit | Attending: Internal Medicine | Admitting: Internal Medicine

## 2017-03-13 DIAGNOSIS — R928 Other abnormal and inconclusive findings on diagnostic imaging of breast: Secondary | ICD-10-CM | POA: Diagnosis not present

## 2017-04-01 DIAGNOSIS — H52203 Unspecified astigmatism, bilateral: Secondary | ICD-10-CM | POA: Diagnosis not present

## 2017-04-01 DIAGNOSIS — H5010 Unspecified exotropia: Secondary | ICD-10-CM | POA: Diagnosis not present

## 2017-04-01 DIAGNOSIS — H353131 Nonexudative age-related macular degeneration, bilateral, early dry stage: Secondary | ICD-10-CM | POA: Diagnosis not present

## 2017-04-01 DIAGNOSIS — Z961 Presence of intraocular lens: Secondary | ICD-10-CM | POA: Diagnosis not present

## 2017-04-08 DIAGNOSIS — L600 Ingrowing nail: Secondary | ICD-10-CM | POA: Diagnosis not present

## 2017-04-08 DIAGNOSIS — B351 Tinea unguium: Secondary | ICD-10-CM | POA: Diagnosis not present

## 2017-05-27 ENCOUNTER — Encounter: Payer: Self-pay | Admitting: Internal Medicine

## 2017-05-29 NOTE — Telephone Encounter (Signed)
Patient is leaving town today for her sister son was DX with Lymphoma patient wuill be back on Sunday scheduled for Tuesday she is afraid her Lupus is active again.

## 2017-06-03 ENCOUNTER — Other Ambulatory Visit: Payer: Self-pay

## 2017-06-03 ENCOUNTER — Ambulatory Visit (INDEPENDENT_AMBULATORY_CARE_PROVIDER_SITE_OTHER): Payer: Medicare HMO | Admitting: Internal Medicine

## 2017-06-03 ENCOUNTER — Encounter: Payer: Self-pay | Admitting: Internal Medicine

## 2017-06-03 VITALS — BP 124/76 | HR 76 | Temp 98.2°F | Wt 202.2 lb

## 2017-06-03 DIAGNOSIS — E7800 Pure hypercholesterolemia, unspecified: Secondary | ICD-10-CM

## 2017-06-03 DIAGNOSIS — R5383 Other fatigue: Secondary | ICD-10-CM | POA: Diagnosis not present

## 2017-06-03 DIAGNOSIS — M328 Other forms of systemic lupus erythematosus: Secondary | ICD-10-CM

## 2017-06-03 DIAGNOSIS — M130 Polyarthritis, unspecified: Secondary | ICD-10-CM

## 2017-06-03 NOTE — Patient Instructions (Signed)
I am ordering labs today to confirm or rule out a return of your SLE   If your labs are normal,  I would like you to consider an antidepressant

## 2017-06-03 NOTE — Progress Notes (Addendum)
Subjective:  Patient ID: Brenda Leon, female    DOB: 1945/08/19  Age: 72 y.o. MRN: [id number]  CC: The primary encounter diagnosis was Polyarthritis. Diagnoses of Fatigue, unspecified type, Pure hypercholesterolemia, and Other forms of systemic lupus erythematosus, unspecified organ involvement status ([place]) were also pertinent to this visit.  HPI Brenda Leon presents for concerns that she has had a  reactivation of SLE diagnosed 20 years ago in [place]> by Dr Greg Cutter , treated with placquenil  For 8 years.   Medication was stopped in 2006 by different rheumatologist when she relocated from [place]  To [facility].  because tests were normal   and quiescent since then   Having episode of extreme fatigue occurring on work days ,  Only at the end of the day.  Too tired to ccok Also having bilateral elbow,  Hips,  Fingers and wrist pain  Low level but constant.  Hips hurting the worst, using arnica gel and tylenol  In order to sleep .   500 mg tylenol   no fevers or rashes, no   unintentional weight loss.  Some weight gain.   Going to [employer] class 2/week feels better after the class.   Some nkle edema even upon waking.  Not short of breath , but dings herself yawning a lot.   Feels off balance.   No falls,   Depressed  By politics,  Sister's death anniversary coming up.  Has seen [name] in the past   Takes no meds except tylenol qhs and zantac prn .GERD .   Stopped omeprazole   atypical chest pain ,at rest,  Tress,  At home husband yells a lot,  Volatile,  Not abusive but verbally loud,     Outpatient Medications Prior to Visit  Medication Sig Dispense Refill  . acetaminophen (TYLENOL) 500 MG tablet Take 500 mg by mouth every 6 (six) hours as needed.    Marland Kitchen b complex vitamins tablet Take 1 tablet by mouth daily.    . Calcium Carbonate Antacid (TUMS PO) Take by mouth.    . Cholecalciferol (VITAMIN D3) 1000 units CAPS Take by mouth.    . Flaxseed, Linseed, (FLAX SEED OIL PO)  Take by mouth.    . Homeopathic Products (ARNICARE) GEL Apply topically.    . Horse Chestnut 300 MG CAPS Take 1 capsule by mouth daily.    . Multiple Vitamins-Minerals (PRESERVISION AREDS 2+MULTI VIT PO) Take 1 capsule by mouth.    . ranitidine (ZANTAC) 300 MG tablet Take 0.5 tablets (150 mg total) by mouth at bedtime. 90 tablet 1  . Red Yeast Rice Extract 600 MG CAPS Take 1 capsule (600 mg total) by mouth daily. (Patient taking differently: Take 1 capsule by mouth 2 (two) times daily. ) 60 capsule 0  . ALPRAZolam (XANAX) 0.25 MG tablet Take 1 tablet (0.25 mg total) by mouth at bedtime as needed for anxiety. 60 tablet 3  . Doxylamine Succinate, Sleep, (SLEEP AID PO) Take 50 mg by mouth at bedtime.     No facility-administered medications prior to visit.     Review of Systems;  Patient denies headache, fevers, malaise, unintentional weight loss, skin rash, eye pain, sinus congestion and sinus pain, sore throat, dysphagia,  hemoptysis , cough, dyspnea, wheezing, chest pain, palpitations, orthopnea, edema, abdominal pain, nausea, melena, diarrhea, constipation, flank pain, dysuria, hematuria, urinary  Frequency, nocturia, numbness, tingling, seizures,  Focal weakness, Loss of consciousness,  Tremor, insomnia, depression, anxiety, and suicidal ideation.  Objective:  BP 124/76 (BP Location: Left Arm, Patient Position: Sitting, Cuff Size: Normal)   Pulse 76   Temp 98.2 F (36.8 C)   Wt 202 lb 3.2 oz (91.7 kg)   SpO2 96%   BMI 30.74 kg/m   BP Readings from Last 3 Encounters:  06/03/17 124/76  11/21/15 132/70  10/19/15 118/64    Wt Readings from Last 3 Encounters:  06/03/17 202 lb 3.2 oz (91.7 kg)  11/21/15 179 lb (81.2 kg)  10/19/15 179 lb (81.2 kg)    General appearance: alert, cooperative and appears stated age Ears: normal TM's and external ear canals both ears Throat: lips, mucosa, and tongue normal; teeth and gums normal Neck: no adenopathy, no carotid bruit, supple,  symmetrical, trachea midline and thyroid not enlarged, symmetric, no tenderness/mass/nodules Back: symmetric, no curvature. ROM normal. No CVA tenderness. Lungs: clear to auscultation bilaterally Heart: regular rate and rhythm, S1, S2 normal, no murmur, click, rub or gallop Abdomen: soft, non-tender; bowel sounds normal; no masses,  no organomegaly Pulses: 2+ and symmetric Skin: Skin color, texture, turgor normal. No rashes or lesions Lymph nodes: Cervical, supraclavicular, and axillary nodes normal.  No results found for: HGBA1C  Lab Results  Component Value Date   CREATININE 0.82 06/03/2017   CREATININE 0.67 09/01/2015   CREATININE 0.6 01/12/2014    Lab Results  Component Value Date   WBC 6.1 06/03/2017   HGB 12.2 06/03/2017   HCT 36.4 06/03/2017   PLT 269.0 06/03/2017   GLUCOSE 91 06/03/2017   CHOL 210 (H) 06/03/2017   TRIG 195.0 (H) 06/03/2017   HDL 45.70 06/03/2017   LDLDIRECT 167.8 02/14/2012   LDLCALC 125 (H) 06/03/2017   ALT 17 06/03/2017   AST 16 06/03/2017   NA 139 06/03/2017   K 4.2 06/03/2017   CL 104 06/03/2017   CREATININE 0.82 06/03/2017   BUN 18 06/03/2017   CO2 28 06/03/2017   TSH 1.55 06/03/2017    Mm Diag Breast Tomo Bilateral  Result Date: 03/13/2017 CLINICAL DATA:  72 year old patient recalled from recent screening mammogram for evaluation of possible architectural distortion in both breasts. EXAM: 2D DIGITAL DIAGNOSTIC BILATERAL MAMMOGRAM WITH CAD AND ADJUNCT TOMO COMPARISON:  02/28/2017 and 10/17/2015 ACR Breast Density Category b: There are scattered areas of fibroglandular density. FINDINGS: Focal spot compression views of the outer breasts bilaterally, including tomography, shows no persistent architectural distortion or mass. The parenchymal pattern of the breasts appears similar to the mammogram August 2017. No findings to suggest malignancy. Mammographic images were processed with CAD. IMPRESSION: No evidence of malignancy in either breast.  RECOMMENDATION: Screening mammogram in one year.(Code:SM-B-01Y) I have discussed the findings and recommendations with the patient. Results were also provided in writing at the conclusion of the visit. If applicable, a reminder letter will be sent to the patient regarding the next appointment. BI-RADS CATEGORY  1: Negative. Electronically Signed   By: Curlene Dolphin M.D.   On: 03/13/2017 14:34    Assessment & Plan:   Problem List Items Addressed This Visit    SLE (systemic lupus erythematosus) ([place])    Unclear diagnosis in 1998 .  No follow up in ten years, since last rheumatologist disagreed with diagnosis and stopped placquenil.  Current symtoms are nonspecific and do not suggst SKE.  Lab Results  Component Value Date   ESRSEDRATE 25 06/03/2017   No results found for: ANA       Fatigue    Screening labs normal.  No history of snoring.  Suspect dysthmia given current dysfunctional relationship at home . Recommended participating in regular exercise program with goal of achieving a minimum of 30 minutes of aerobic activity 5 days per week.   . Lab Results  Component Value Date   TSH 1.55 06/03/2017   Lab Results  Component Value Date   WBC 6.1 06/03/2017   HGB 12.2 06/03/2017   HCT 36.4 06/03/2017   MCV 89.5 06/03/2017   PLT 269.0 06/03/2017         Relevant Orders   TSH (Completed)   CBC with Differential/Platelet (Completed)   Comprehensive metabolic panel (Completed)    Other Visit Diagnoses    Polyarthritis    -  Primary   Relevant Orders   Sedimentation rate (Completed)   Antinuclear Antib (ANA) (Completed)   Pure hypercholesterolemia       Relevant Orders   Lipid panel (Completed)     A total of 40 minutes was spent with patient more than half of which was spent in counseling patient on the above mentioned issues , reviewing and explaining recent labs and imaging studies done, and coordination of care.  I have discontinued Brenda Leon. Brenda Leon  (Doxylamine Succinate, Sleep, (SLEEP AID PO)) and ALPRAZolam. I am also having her maintain her (Flaxseed, Linseed, (FLAX SEED OIL PO)), b complex vitamins, Red Yeast Rice Extract, Horse Chestnut, ranitidine, Vitamin D3, Multiple Vitamins-Minerals (PRESERVISION AREDS 2+MULTI VIT PO), acetaminophen, Calcium Carbonate Antacid (TUMS PO), and ARNICARE.  No orders of the defined types were placed in this encounter.   Medications Discontinued During This Encounter  Medication Reason  . Doxylamine Succinate, Sleep, (SLEEP AID PO)   . ALPRAZolam (XANAX) 0.25 MG tablet     Follow-up: Return in about 2 weeks (around 06/17/2017).   Crecencio Mc, MD

## 2017-06-04 ENCOUNTER — Encounter: Payer: Self-pay | Admitting: Internal Medicine

## 2017-06-04 DIAGNOSIS — M329 Systemic lupus erythematosus, unspecified: Secondary | ICD-10-CM

## 2017-06-04 DIAGNOSIS — O26811 Pregnancy related exhaustion and fatigue, first trimester: Secondary | ICD-10-CM | POA: Insufficient documentation

## 2017-06-04 DIAGNOSIS — R5383 Other fatigue: Secondary | ICD-10-CM | POA: Insufficient documentation

## 2017-06-04 LAB — TSH: TSH: 1.55 u[IU]/mL (ref 0.35–4.50)

## 2017-06-04 LAB — LIPID PANEL
Cholesterol: 210 mg/dL — ABNORMAL HIGH (ref 0–200)
HDL: 45.7 mg/dL (ref >39.00)
LDL CALC: 125 mg/dL — AB (ref 0–99)
NonHDL: 164.23
TRIGLYCERIDES: 195 mg/dL — AB (ref 0.0–149.0)
Total CHOL/HDL Ratio: 5
VLDL: 39 mg/dL (ref 0.0–40.0)

## 2017-06-04 LAB — COMPREHENSIVE METABOLIC PANEL
ALT: 17 U/L (ref 0–35)
AST: 16 U/L (ref 0–37)
Albumin: 4.2 g/dL (ref 3.5–5.2)
Alkaline Phosphatase: 53 U/L (ref 39–117)
BUN: 18 mg/dL (ref 6–23)
CALCIUM: 9.2 mg/dL (ref 8.4–10.5)
CHLORIDE: 104 meq/L (ref 96–112)
CO2: 28 mEq/L (ref 19–32)
Creatinine, Ser: 0.82 mg/dL (ref 0.40–1.20)
GFR: 73.11 mL/min (ref >60.00)
Glucose, Bld: 91 mg/dL (ref 70–99)
POTASSIUM: 4.2 meq/L (ref 3.5–5.1)
SODIUM: 139 meq/L (ref 135–145)
Total Bilirubin: 0.2 mg/dL (ref 0.2–1.2)
Total Protein: 7.3 g/dL (ref 6.0–8.3)

## 2017-06-04 LAB — SEDIMENTATION RATE: Sed Rate: 25 mm/hr (ref 0–30)

## 2017-06-04 LAB — CBC WITH DIFFERENTIAL/PLATELET
BASOS PCT: 1.2 % (ref 0.0–3.0)
Basophils Absolute: 0.1 10*3/uL (ref 0.0–0.1)
EOS PCT: 3.6 % (ref 0.0–5.0)
Eosinophils Absolute: 0.2 10*3/uL (ref 0.0–0.7)
HEMATOCRIT: 36.4 % (ref 36.0–46.0)
HEMOGLOBIN: 12.2 g/dL (ref 12.0–15.0)
Lymphocytes Relative: 28.1 % (ref 12.0–46.0)
Lymphs Abs: 1.7 10*3/uL (ref 0.7–4.0)
MCHC: 33.6 g/dL (ref 30.0–36.0)
MCV: 89.5 fl (ref 78.0–100.0)
MONO ABS: 0.5 10*3/uL (ref 0.1–1.0)
Monocytes Relative: 7.9 % (ref 3.0–12.0)
Neutro Abs: 3.6 10*3/uL (ref 1.4–7.7)
Neutrophils Relative %: 59.2 % (ref 43.0–77.0)
Platelets: 269 10*3/uL (ref 150.0–400.0)
RBC: 4.07 Mil/uL (ref 3.87–5.11)
RDW: 13.4 % (ref 11.5–15.5)
WBC: 6.1 10*3/uL (ref 4.0–10.5)

## 2017-06-04 NOTE — Assessment & Plan Note (Signed)
Unclear diagnosis in 1998 .  No follow up in ten years, since last rheumatologist disagreed with diagnosis and stopped placquenil.  Current symtoms are nonspecific and do not suggst SKE.  Lab Results  Component Value Date   ESRSEDRATE 25 06/03/2017   No results found for: ANA

## 2017-06-04 NOTE — Assessment & Plan Note (Addendum)
Screening labs normal.  No history of snoring.  Suspect dysthmia given current dysfunctional relationship at home . Recommended participating in regular exercise program with goal of achieving a minimum of 30 minutes of aerobic activity 5 days per week.   . Lab Results  Component Value Date   TSH 1.55 06/03/2017   Lab Results  Component Value Date   WBC 6.1 06/03/2017   HGB 12.2 06/03/2017   HCT 36.4 06/03/2017   MCV 89.5 06/03/2017   PLT 269.0 06/03/2017

## 2017-06-05 ENCOUNTER — Other Ambulatory Visit: Payer: Self-pay | Admitting: Internal Medicine

## 2017-06-05 DIAGNOSIS — R609 Edema, unspecified: Secondary | ICD-10-CM

## 2017-06-05 LAB — ANTI-NUCLEAR AB-TITER (ANA TITER): ANA Titer 1: >=1:1280 {titer} — AB

## 2017-06-05 LAB — ANA: Anti Nuclear Antibody(ANA): POSITIVE — AB

## 2017-06-10 ENCOUNTER — Other Ambulatory Visit (INDEPENDENT_AMBULATORY_CARE_PROVIDER_SITE_OTHER): Payer: Medicare HMO

## 2017-06-10 DIAGNOSIS — R609 Edema, unspecified: Secondary | ICD-10-CM

## 2017-06-10 LAB — URINALYSIS, ROUTINE W REFLEX MICROSCOPIC
BILIRUBIN URINE: NEGATIVE
HGB URINE DIPSTICK: NEGATIVE
Ketones, ur: NEGATIVE
NITRITE: NEGATIVE
PH: 6.5 (ref 5.0–8.0)
RBC / HPF: NONE SEEN (ref 0–?)
Specific Gravity, Urine: <=1.005 — AB (ref 1.000–1.030)
TOTAL PROTEIN, URINE-UPE24: NEGATIVE
UROBILINOGEN UA: 0.2 (ref 0.0–1.0)
Urine Glucose: NEGATIVE

## 2017-06-11 ENCOUNTER — Other Ambulatory Visit: Payer: Self-pay | Admitting: Internal Medicine

## 2017-06-11 ENCOUNTER — Other Ambulatory Visit: Payer: Medicare HMO

## 2017-06-11 DIAGNOSIS — R609 Edema, unspecified: Secondary | ICD-10-CM | POA: Diagnosis not present

## 2017-06-11 LAB — MICROALBUMIN / CREATININE URINE RATIO
Creatinine,U: 23.2 mg/dL
Microalb Creat Ratio: 3 mg/g (ref 0.0–30.0)

## 2017-06-12 DIAGNOSIS — D8989 Other specified disorders involving the immune mechanism, not elsewhere classified: Secondary | ICD-10-CM | POA: Diagnosis not present

## 2017-06-12 DIAGNOSIS — Z13820 Encounter for screening for osteoporosis: Secondary | ICD-10-CM | POA: Diagnosis not present

## 2017-06-12 DIAGNOSIS — R5382 Chronic fatigue, unspecified: Secondary | ICD-10-CM | POA: Diagnosis not present

## 2017-06-12 DIAGNOSIS — M329 Systemic lupus erythematosus, unspecified: Secondary | ICD-10-CM | POA: Diagnosis not present

## 2017-06-12 DIAGNOSIS — M3219 Other organ or system involvement in systemic lupus erythematosus: Secondary | ICD-10-CM | POA: Diagnosis not present

## 2017-06-16 ENCOUNTER — Encounter: Payer: Self-pay | Admitting: Internal Medicine

## 2017-06-17 ENCOUNTER — Ambulatory Visit (INDEPENDENT_AMBULATORY_CARE_PROVIDER_SITE_OTHER): Payer: Medicare HMO | Admitting: Internal Medicine

## 2017-06-17 ENCOUNTER — Encounter: Payer: Self-pay | Admitting: Internal Medicine

## 2017-06-17 DIAGNOSIS — M328 Other forms of systemic lupus erythematosus: Secondary | ICD-10-CM | POA: Diagnosis not present

## 2017-06-17 DIAGNOSIS — S0300XA Dislocation of jaw, unspecified side, initial encounter: Secondary | ICD-10-CM

## 2017-06-17 DIAGNOSIS — R5382 Chronic fatigue, unspecified: Secondary | ICD-10-CM

## 2017-06-17 DIAGNOSIS — R010 Benign and innocent cardiac murmurs: Secondary | ICD-10-CM | POA: Diagnosis not present

## 2017-06-17 MED ORDER — DIAZEPAM 5 MG PO TABS: 5.0000 mg | ORAL_TABLET | Freq: Two times a day (BID) | ORAL | 1 refills | Status: DC | PRN

## 2017-06-17 NOTE — Progress Notes (Signed)
Subjective:  Patient ID: Brenda Leon, female    DOB: Aug 25, 1945  Age: 72 y.o. MRN: [id number]  CC: Diagnoses of TMJ (dislocation of temporomandibular joint), initial encounter, Other forms of systemic lupus erythematosus, unspecified organ involvement status ([place]), Chronic fatigue, and Functional systolic murmur were pertinent to this visit.  HPI Brenda Leon presents for follow up on joint pain, workup for SLE In  progress with Dr Meda Coffee  Still very  fatigued but has not any more profound episodes  That were incapacitating. No chest pain , diaphoresis, or jaw pain,  But had an episode of ear pain on the right that she feels may be due to TMJ.    Outpatient Medications Prior to Visit  Medication Sig Dispense Refill  . acetaminophen (TYLENOL) 500 MG tablet Take 500 mg by mouth every 6 (six) hours as needed.    . ALPRAZolam (XANAX) 0.25 MG tablet Take by mouth.    Marland Kitchen b complex vitamins tablet Take 1 tablet by mouth daily.    . Calcium Carbonate Antacid (TUMS PO) Take by mouth.    . Cholecalciferol (VITAMIN D3) 1000 units CAPS Take by mouth.    . Flaxseed, Linseed, (FLAX SEED OIL PO) Take by mouth.    . Homeopathic Products (ARNICARE) GEL Apply topically.    . Horse Chestnut 300 MG CAPS Take 1 capsule by mouth daily.    . Multiple Vitamins-Minerals (PRESERVISION AREDS 2+MULTI VIT PO) Take 1 capsule by mouth.    Marland Kitchen omeprazole (PRILOSEC) 20 MG capsule Take by mouth.    . Red Yeast Rice Extract 600 MG CAPS Take 1 capsule (600 mg total) by mouth daily. (Patient taking differently: Take 1 capsule by mouth 2 (two) times daily. ) 60 capsule 0  . ranitidine (ZANTAC) 300 MG tablet Take 0.5 tablets (150 mg total) by mouth at bedtime. (Patient not taking: Reported on 06/17/2017) 90 tablet 1   No facility-administered medications prior to visit.     Review of Systems;  Patient denies headache, fevers, , unintentional weight loss, skin rash, eye pain, sinus congestion and sinus pain, sore throat,  dysphagia,  hemoptysis , cough, dyspnea, wheezing, chest pain, palpitations, orthopnea, edema, abdominal pain, nausea, melena, diarrhea, constipation, flank pain, dysuria, hematuria, urinary  Frequency, nocturia, numbness, tingling, seizures,  Focal weakness, Loss of consciousness,  Tremor, insomnia, depression, anxiety, and suicidal ideation.      Objective:  BP 136/68 (BP Location: Left Arm, Patient Position: Sitting, Cuff Size: Normal)   Pulse 89   Temp 98.7 F (37.1 C) (Oral)   Resp 15   Ht 5\' 8"  (1.727 m)   Wt 202 lb 6.4 oz (91.8 kg)   SpO2 94%   BMI 30.77 kg/m   BP Readings from Last 3 Encounters:  06/17/17 136/68  06/03/17 124/76  11/21/15 132/70    Wt Readings from Last 3 Encounters:  06/17/17 202 lb 6.4 oz (91.8 kg)  06/03/17 202 lb 3.2 oz (91.7 kg)  11/21/15 179 lb (81.2 kg)    General appearance: alert, cooperative and appears stated age Ears: normal TM's and external ear canals both ears Throat: lips, mucosa, and tongue normal; teeth and gums normal Neck: no adenopathy, no carotid bruit, supple, symmetrical, trachea midline and thyroid not enlarged, symmetric, no tenderness/mass/nodules Back: symmetric, no curvature. ROM normal. No CVA tenderness. Lungs: clear to auscultation bilaterally Heart: regular rate and rhythm, S1, S2 normal, Grade 2 systolic murmur click, rub or gallop Abdomen: soft, non-tender; bowel sounds normal; no masses,  no organomegaly Pulses: 2+ and symmetric Skin: Skin color, texture, turgor normal. No rashes or lesions Lymph nodes: Cervical, supraclavicular, and axillary nodes normal.  No results found for: HGBA1C  Lab Results  Component Value Date   CREATININE 0.82 06/03/2017   CREATININE 0.67 09/01/2015   CREATININE 0.6 01/12/2014    Lab Results  Component Value Date   WBC 6.1 06/03/2017   HGB 12.2 06/03/2017   HCT 36.4 06/03/2017   PLT 269.0 06/03/2017   GLUCOSE 91 06/03/2017   CHOL 210 (H) 06/03/2017   TRIG 195.0 (H)  06/03/2017   HDL 45.70 06/03/2017   LDLDIRECT 167.8 02/14/2012   LDLCALC 125 (H) 06/03/2017   ALT 17 06/03/2017   AST 16 06/03/2017   NA 139 06/03/2017   K 4.2 06/03/2017   CL 104 06/03/2017   CREATININE 0.82 06/03/2017   BUN 18 06/03/2017   CO2 28 06/03/2017   TSH 1.55 06/03/2017   MICROALBUR <0.7 06/11/2017    Mm Diag Breast Tomo Bilateral  Result Date: 03/13/2017 CLINICAL DATA:  71 year old patient recalled from recent screening mammogram for evaluation of possible architectural distortion in both breasts. EXAM: 2D DIGITAL DIAGNOSTIC BILATERAL MAMMOGRAM WITH CAD AND ADJUNCT TOMO COMPARISON:  02/28/2017 and 10/17/2015 ACR Breast Density Category b: There are scattered areas of fibroglandular density. FINDINGS: Focal spot compression views of the outer breasts bilaterally, including tomography, shows no persistent architectural distortion or mass. The parenchymal pattern of the breasts appears similar to the mammogram August 2017. No findings to suggest malignancy. Mammographic images were processed with CAD. IMPRESSION: No evidence of malignancy in either breast. RECOMMENDATION: Screening mammogram in one year.(Code:SM-B-01Y) I have discussed the findings and recommendations with the patient. Results were also provided in writing at the conclusion of the visit. If applicable, a reminder letter will be sent to the patient regarding the next appointment. BI-RADS CATEGORY  1: Negative. Electronically Signed   By: Curlene Dolphin M.D.   On: 03/13/2017 14:34    Assessment & Plan:   Problem List Items Addressed This Visit    TMJ (dislocation of temporomandibular joint), initial encounter    Right occurring at night and waking her from sleep. ,  Muscle spasm addressed with low dose valium at bedtime       SLE (systemic lupus erythematosus) ([place])    She has been evaluated by Dr Meda Coffee to confirm diagnosis and resume treatment with Placquenil if confirmed.        Functional systolic murmur     No prior evaluation, accompanied by exertionla fatigue.  Cardiology evaluation recommended.       Fatigue    Exertional., with systolic murmur noted on exam.   screening labs normal.  Given her presumptive diagnosis of SLE , need to rule out cardiomyopathy. Refer to cardiology advised          I have discontinued Sheila Ocasio. Burdine "Patty"'s ranitidine. I am also having her start on diazepam. Additionally, I am having her maintain her (Flaxseed, Linseed, (FLAX SEED OIL PO)), b complex vitamins, Red Yeast Rice Extract, Horse Chestnut, Vitamin D3, Multiple Vitamins-Minerals (PRESERVISION AREDS 2+MULTI VIT PO), acetaminophen, Calcium Carbonate Antacid (TUMS PO), ARNICARE, omeprazole, and ALPRAZolam.  Meds ordered this encounter  Medications  . diazepam (VALIUM) 5 MG tablet    Sig: Take 1 tablet (5 mg total) by mouth every 12 (twelve) hours as needed for anxiety or muscle spasms.    Dispense:  30 tablet    Refill:  1    Medications Discontinued  During This Encounter  Medication Reason  . ranitidine (ZANTAC) 300 MG tablet Change in therapy    Follow-up: No follow-ups on file.   Crecencio Mc, MD

## 2017-06-17 NOTE — Patient Instructions (Signed)
Trial of low dose valium at bedtime for  TMJ symptoms affecting your ear.  Start with 1/2 tablet   Referral to Cardiology for evaluation of heart murmur and exercise intolerance/fatigue

## 2017-06-19 DIAGNOSIS — R010 Benign and innocent cardiac murmurs: Secondary | ICD-10-CM | POA: Insufficient documentation

## 2017-06-19 DIAGNOSIS — S0300XA Dislocation of jaw, unspecified side, initial encounter: Secondary | ICD-10-CM | POA: Insufficient documentation

## 2017-06-19 NOTE — Assessment & Plan Note (Signed)
No prior evaluation, accompanied by exertionla fatigue.  Cardiology evaluation recommended.

## 2017-06-19 NOTE — Assessment & Plan Note (Addendum)
Exertional., with systolic murmur noted on exam.   screening labs normal.  Given her presumptive diagnosis of SLE , need to rule out cardiomyopathy. Refer to cardiology advised

## 2017-06-19 NOTE — Assessment & Plan Note (Signed)
Right occurring at night and waking her from sleep. ,  Muscle spasm addressed with low dose valium at bedtime

## 2017-06-19 NOTE — Assessment & Plan Note (Signed)
She has been evaluated by Dr Meda Coffee to confirm diagnosis and resume treatment with Placquenil if confirmed.

## 2017-06-26 DIAGNOSIS — M3219 Other organ or system involvement in systemic lupus erythematosus: Secondary | ICD-10-CM | POA: Diagnosis not present

## 2017-06-26 DIAGNOSIS — Z13820 Encounter for screening for osteoporosis: Secondary | ICD-10-CM | POA: Diagnosis not present

## 2017-06-26 DIAGNOSIS — R5382 Chronic fatigue, unspecified: Secondary | ICD-10-CM | POA: Diagnosis not present

## 2017-07-08 DIAGNOSIS — H5034 Intermittent alternating exotropia: Secondary | ICD-10-CM | POA: Diagnosis not present

## 2017-07-22 DIAGNOSIS — Z79899 Other long term (current) drug therapy: Secondary | ICD-10-CM | POA: Diagnosis not present

## 2017-08-21 DIAGNOSIS — H5034 Intermittent alternating exotropia: Secondary | ICD-10-CM | POA: Diagnosis not present

## 2017-08-22 ENCOUNTER — Ambulatory Visit (INDEPENDENT_AMBULATORY_CARE_PROVIDER_SITE_OTHER): Payer: Medicare HMO | Admitting: Psychology

## 2017-08-22 DIAGNOSIS — F4323 Adjustment disorder with mixed anxiety and depressed mood: Secondary | ICD-10-CM | POA: Diagnosis not present

## 2017-08-22 DIAGNOSIS — R69 Illness, unspecified: Secondary | ICD-10-CM | POA: Diagnosis not present

## 2017-09-04 ENCOUNTER — Encounter (HOSPITAL_BASED_OUTPATIENT_CLINIC_OR_DEPARTMENT_OTHER): Payer: Self-pay | Admitting: *Deleted

## 2017-09-05 ENCOUNTER — Ambulatory Visit: Payer: Self-pay | Admitting: Ophthalmology

## 2017-09-12 ENCOUNTER — Encounter (HOSPITAL_BASED_OUTPATIENT_CLINIC_OR_DEPARTMENT_OTHER): Payer: Self-pay | Admitting: *Deleted

## 2017-09-12 ENCOUNTER — Other Ambulatory Visit: Payer: Self-pay

## 2017-09-12 ENCOUNTER — Ambulatory Visit (HOSPITAL_BASED_OUTPATIENT_CLINIC_OR_DEPARTMENT_OTHER): Payer: Medicare HMO | Admitting: Anesthesiology

## 2017-09-12 ENCOUNTER — Ambulatory Visit: Payer: Self-pay | Admitting: Ophthalmology

## 2017-09-12 ENCOUNTER — Encounter (HOSPITAL_BASED_OUTPATIENT_CLINIC_OR_DEPARTMENT_OTHER)
Admission: RE | Disposition: A | Discharge status: Home / Self Care | Payer: Self-pay | Source: Ambulatory Visit | Attending: Ophthalmology

## 2017-09-12 ENCOUNTER — Ambulatory Visit (HOSPITAL_BASED_OUTPATIENT_CLINIC_OR_DEPARTMENT_OTHER)
Admission: RE | Admit: 2017-09-12 | Discharge: 2017-09-12 | Disposition: A | Discharge status: Home / Self Care | Payer: Medicare HMO | Source: Ambulatory Visit | Attending: Ophthalmology | Admitting: Ophthalmology

## 2017-09-12 DIAGNOSIS — I739 Peripheral vascular disease, unspecified: Secondary | ICD-10-CM | POA: Diagnosis not present

## 2017-09-12 DIAGNOSIS — K219 Gastro-esophageal reflux disease without esophagitis: Secondary | ICD-10-CM | POA: Insufficient documentation

## 2017-09-12 DIAGNOSIS — F419 Anxiety disorder, unspecified: Secondary | ICD-10-CM | POA: Insufficient documentation

## 2017-09-12 DIAGNOSIS — M1990 Unspecified osteoarthritis, unspecified site: Secondary | ICD-10-CM | POA: Diagnosis not present

## 2017-09-12 DIAGNOSIS — H5010 Unspecified exotropia: Secondary | ICD-10-CM | POA: Insufficient documentation

## 2017-09-12 DIAGNOSIS — Z79899 Other long term (current) drug therapy: Secondary | ICD-10-CM | POA: Insufficient documentation

## 2017-09-12 DIAGNOSIS — H5111 Convergence insufficiency: Secondary | ICD-10-CM | POA: Diagnosis not present

## 2017-09-12 DIAGNOSIS — Z87891 Personal history of nicotine dependence: Secondary | ICD-10-CM | POA: Insufficient documentation

## 2017-09-12 DIAGNOSIS — D649 Anemia, unspecified: Secondary | ICD-10-CM | POA: Diagnosis not present

## 2017-09-12 DIAGNOSIS — M329 Systemic lupus erythematosus, unspecified: Secondary | ICD-10-CM | POA: Diagnosis not present

## 2017-09-12 DIAGNOSIS — H5034 Intermittent alternating exotropia: Secondary | ICD-10-CM | POA: Diagnosis not present

## 2017-09-12 DIAGNOSIS — R69 Illness, unspecified: Secondary | ICD-10-CM | POA: Diagnosis not present

## 2017-09-12 HISTORY — PX: STRABISMUS SURGERY: SHX218

## 2017-09-12 SURGERY — STRABISMUS SURGERY, BILATERAL
Anesthesia: General | Site: Eye | Laterality: Right

## 2017-09-12 MED ORDER — FENTANYL CITRATE (PF) 100 MCG/2ML IJ SOLN
INTRAMUSCULAR | Status: AC
  Filled 2017-09-12: qty 2

## 2017-09-12 MED ORDER — FENTANYL CITRATE (PF) 100 MCG/2ML IJ SOLN
25.0000 ug | INTRAMUSCULAR | Status: DC | PRN
  Administered 2017-09-12 (×2): 25 ug via INTRAVENOUS

## 2017-09-12 MED ORDER — KETOROLAC TROMETHAMINE 30 MG/ML IJ SOLN
INTRAMUSCULAR | Status: DC | PRN
  Administered 2017-09-12: 15 mg via INTRAVENOUS

## 2017-09-12 MED ORDER — ONDANSETRON HCL 4 MG/2ML IJ SOLN
INTRAMUSCULAR | Status: AC
  Filled 2017-09-12: qty 2

## 2017-09-12 MED ORDER — SCOPOLAMINE 1 MG/3DAYS TD PT72
1.0000 | MEDICATED_PATCH | Freq: Once | TRANSDERMAL | Status: DC | PRN
  Administered 2017-09-12: 1.5 mg via TRANSDERMAL

## 2017-09-12 MED ORDER — TOBRAMYCIN-DEXAMETHASONE 0.3-0.1 % OP OINT
TOPICAL_OINTMENT | OPHTHALMIC | Status: DC | PRN
  Administered 2017-09-12: 1 via OPHTHALMIC

## 2017-09-12 MED ORDER — KETOROLAC TROMETHAMINE 30 MG/ML IJ SOLN
INTRAMUSCULAR | Status: AC
  Filled 2017-09-12: qty 1

## 2017-09-12 MED ORDER — SCOPOLAMINE 1 MG/3DAYS TD PT72
MEDICATED_PATCH | TRANSDERMAL | Status: AC
  Filled 2017-09-12: qty 1

## 2017-09-12 MED ORDER — ONDANSETRON HCL 4 MG/2ML IJ SOLN
INTRAMUSCULAR | Status: DC | PRN
  Administered 2017-09-12: 4 mg via INTRAVENOUS

## 2017-09-12 MED ORDER — LIDOCAINE 2% (20 MG/ML) 5 ML SYRINGE
INTRAMUSCULAR | Status: DC | PRN
  Administered 2017-09-12: 50 mg via INTRAVENOUS

## 2017-09-12 MED ORDER — GLYCOPYRROLATE 0.2 MG/ML IJ SOLN
INTRAMUSCULAR | Status: DC | PRN
  Administered 2017-09-12: .2 mg via INTRAVENOUS

## 2017-09-12 MED ORDER — LACTATED RINGERS IV SOLN
INTRAVENOUS | Status: DC
  Administered 2017-09-12: 10:00:00 via INTRAVENOUS

## 2017-09-12 MED ORDER — LIDOCAINE HCL (CARDIAC) PF 100 MG/5ML IV SOSY
PREFILLED_SYRINGE | INTRAVENOUS | Status: AC
  Filled 2017-09-12: qty 5

## 2017-09-12 MED ORDER — DEXAMETHASONE SODIUM PHOSPHATE 4 MG/ML IJ SOLN
INTRAMUSCULAR | Status: DC | PRN
  Administered 2017-09-12: 10 mg via INTRAVENOUS

## 2017-09-12 MED ORDER — FENTANYL CITRATE (PF) 100 MCG/2ML IJ SOLN
50.0000 ug | INTRAMUSCULAR | Status: AC | PRN
  Administered 2017-09-12: 50 ug via INTRAVENOUS
  Administered 2017-09-12: 25 ug via INTRAVENOUS
  Administered 2017-09-12: 50 ug via INTRAVENOUS

## 2017-09-12 MED ORDER — OXYCODONE HCL 5 MG/5ML PO SOLN: 5.0000 mg | Freq: Once | ORAL | Status: DC | PRN

## 2017-09-12 MED ORDER — GLYCOPYRROLATE PF 0.2 MG/ML IJ SOSY
PREFILLED_SYRINGE | INTRAMUSCULAR | Status: AC
  Filled 2017-09-12: qty 1

## 2017-09-12 MED ORDER — OXYCODONE HCL 5 MG PO TABS: 5.0000 mg | ORAL_TABLET | Freq: Once | ORAL | Status: DC | PRN

## 2017-09-12 MED ORDER — PROMETHAZINE HCL 25 MG/ML IJ SOLN
INTRAMUSCULAR | Status: AC
  Filled 2017-09-12: qty 1

## 2017-09-12 MED ORDER — MIDAZOLAM HCL 2 MG/2ML IJ SOLN: 1.0000 mg | INTRAMUSCULAR | Status: DC | PRN

## 2017-09-12 MED ORDER — PROMETHAZINE HCL 25 MG/ML IJ SOLN
6.2500 mg | INTRAMUSCULAR | Status: AC | PRN
  Administered 2017-09-12 (×2): 6.25 mg via INTRAVENOUS

## 2017-09-12 MED ORDER — DEXAMETHASONE SODIUM PHOSPHATE 10 MG/ML IJ SOLN
INTRAMUSCULAR | Status: AC
  Filled 2017-09-12: qty 1

## 2017-09-12 MED ORDER — ONDANSETRON HCL 4 MG/2ML IJ SOLN
4.0000 mg | Freq: Four times a day (QID) | INTRAMUSCULAR | Status: AC | PRN
  Administered 2017-09-12: 4 mg via INTRAVENOUS

## 2017-09-12 SURGICAL SUPPLY — 32 items
APPLICATOR COTTON TIP 6 STRL (MISCELLANEOUS) ×8 IMPLANT
APPLICATOR COTTON TIP 6IN STRL (MISCELLANEOUS) ×12
APPLICATOR DR MATTHEWS STRL (MISCELLANEOUS) ×3 IMPLANT
BNDG EYE OVAL (MISCELLANEOUS) ×6 IMPLANT
CAUTERY EYE LOW TEMP 1300F FIN (OPHTHALMIC RELATED) IMPLANT
COVER BACK TABLE 60X90IN (DRAPES) ×3 IMPLANT
COVER MAYO STAND STRL (DRAPES) ×3 IMPLANT
DRAPE SURG 17X23 STRL (DRAPES) ×6 IMPLANT
DRAPE U-SHAPE 76X120 STRL (DRAPES) ×3 IMPLANT
GLOVE BIO SURGEON STRL SZ 6.5 (GLOVE) ×6 IMPLANT
GLOVE BIOGEL M STRL SZ7.5 (GLOVE) ×3 IMPLANT
GLOVE ECLIPSE 6.5 STRL STRAW (GLOVE) ×3 IMPLANT
GOWN STRL REUS W/ TWL LRG LVL3 (GOWN DISPOSABLE) ×4 IMPLANT
GOWN STRL REUS W/TWL LRG LVL3 (GOWN DISPOSABLE) ×2
GOWN STRL REUS W/TWL XL LVL3 (GOWN DISPOSABLE) ×3 IMPLANT
NS IRRIG 1000ML POUR BTL (IV SOLUTION) ×3 IMPLANT
PACK BASIN DAY SURGERY FS (CUSTOM PROCEDURE TRAY) ×3 IMPLANT
SHEET MEDIUM DRAPE 40X70 STRL (DRAPES) IMPLANT
SLEEVE SCD COMPRESS KNEE MED (MISCELLANEOUS) ×3 IMPLANT
SPEAR EYE SURG WECK-CEL (MISCELLANEOUS) ×9 IMPLANT
STRIP CLOSURE SKIN 1/4X4 (GAUZE/BANDAGES/DRESSINGS) ×3 IMPLANT
SUT 6 0 SILK T G140 8DA (SUTURE) ×3 IMPLANT
SUT MERSILENE 6-0 18IN S14 8MM (SUTURE)
SUT PLAIN 6 0 TG1408 (SUTURE) ×6 IMPLANT
SUT SILK 4 0 C 3 735G (SUTURE) IMPLANT
SUT VICRYL 6 0 S 28 (SUTURE) ×3 IMPLANT
SUT VICRYL ABS 6-0 S29 18IN (SUTURE) ×3 IMPLANT
SUTURE MERSLN 6-0 18IN S14 8MM (SUTURE) IMPLANT
SYR 10ML LL (SYRINGE) ×3 IMPLANT
SYR TB 1ML LL NO SAFETY (SYRINGE) ×3 IMPLANT
TOWEL GREEN STERILE FF (TOWEL DISPOSABLE) ×3 IMPLANT
TRAY DSU PREP LF (CUSTOM PROCEDURE TRAY) ×3 IMPLANT

## 2017-09-12 NOTE — Discharge Instructions (Signed)
Diet: Clear liquids, advance to soft foods then regular diet as tolerated by this evening.  Pain control:   1)  Ibuprofen 600 mg by mouth every 6-8 hours as needed for pain  2)  Ice pack/cold compress to operated eye(s) as desired  Eye medications:  Tobradex or Zylet eye ointment 1/2 inch in operated eye(s) twice a day if directed to do so by Dr. Annamaria Boots  Activity: No swimming for 1 week.  It is OK to let water run over the face and eyes while showering or taking a bath, even during the first week.  No other restriction on exercise or activity.  Leave eye patch in place until seen in Dr. Janee Morn office this afternoon for suture adjustment.     Post Anesthesia Home Care Instructions  Activity: Get plenty of rest for the remainder of the day. A responsible individual must stay with you for 24 hours following the procedure.  For the next 24 hours, DO NOT: -Drive a car -Paediatric nurse -Drink alcoholic beverages -Take any medication unless instructed by your physician -Make any legal decisions or sign important papers.  Meals: Start with liquid foods such as gelatin or soup. Progress to regular foods as tolerated. Avoid greasy, spicy, heavy foods. If nausea and/or vomiting occur, drink only clear liquids until the nausea and/or vomiting subsides. Call your physician if vomiting continues.  Special Instructions/Symptoms: Your throat may feel dry or sore from the anesthesia or the breathing tube placed in your throat during surgery. If this causes discomfort, gargle with warm salt water. The discomfort should disappear within 24 hours.  If you had a scopolamine patch placed behind your ear for the management of post- operative nausea and/or vomiting:  1. The medication in the patch is effective for 72 hours, after which it should be removed.  Wrap patch in a tissue and discard in the trash. Wash hands thoroughly with soap and water. 2. You may remove the patch earlier than 72 hours if you  experience unpleasant side effects which may include dry mouth, dizziness or visual disturbances. 3. Avoid touching the patch. Wash your hands with soap and water after contact with the patch.    Post Anesthesia Home Care Instructions  Activity: Get plenty of rest for the remainder of the day. A responsible individual must stay with you for 24 hours following the procedure.  For the next 24 hours, DO NOT: -Drive a car -Paediatric nurse -Drink alcoholic beverages -Take any medication unless instructed by your physician -Make any legal decisions or sign important papers.  Meals: Start with liquid foods such as gelatin or soup. Progress to regular foods as tolerated. Avoid greasy, spicy, heavy foods. If nausea and/or vomiting occur, drink only clear liquids until the nausea and/or vomiting subsides. Call your physician if vomiting continues.  Special Instructions/Symptoms: Your throat may feel dry or sore from the anesthesia or the breathing tube placed in your throat during surgery. If this causes discomfort, gargle with warm salt water. The discomfort should disappear within 24 hours.  If you had a scopolamine patch placed behind your ear for the management of post- operative nausea and/or vomiting:  1. The medication in the patch is effective for 72 hours, after which it should be removed.  Wrap patch in a tissue and discard in the trash. Wash hands thoroughly with soap and water. 2. You may remove the patch earlier than 72 hours if you experience unpleasant side effects which may include dry mouth, dizziness or visual  disturbances. 3. Avoid touching the patch. Wash your hands with soap and water after contact with the patch.       Call Dr. Janee Morn office [phone] with any problems or concerns.

## 2017-09-12 NOTE — Anesthesia Postprocedure Evaluation (Signed)
Anesthesia Post Note  Patient: Brenda Leon  Procedure(s) Performed: BILATERAL STRABISMUS REPAIR (Bilateral Eye)     Patient location during evaluation: PACU Anesthesia Type: General Level of consciousness: awake and alert Pain management: pain level controlled Vital Signs Assessment: post-procedure vital signs reviewed and stable Respiratory status: spontaneous breathing, nonlabored ventilation, respiratory function stable and patient connected to nasal cannula oxygen Cardiovascular status: blood pressure returned to baseline and stable Postop Assessment: no apparent nausea or vomiting Anesthetic complications: no    Last Vitals:  Vitals:   09/12/17 1340 09/12/17 1411  BP: 132/62 (!) 149/65  Pulse: 79 83  Resp: 15 20  Temp: 37.1 C 36.5 C  SpO2: 97% 94%    Last Pain:  Vitals:   09/12/17 1411  TempSrc: Oral  PainSc: [place]

## 2017-09-12 NOTE — Anesthesia Preprocedure Evaluation (Signed)
Anesthesia Evaluation  Patient identified by MRN, date of birth, ID band Patient awake    Reviewed: Allergy & Precautions, H&P , NPO status , Patient's Chart, lab work & pertinent test results  Airway Mallampati: II   Neck ROM: full    Dental   Pulmonary former smoker,    breath sounds clear to auscultation       Cardiovascular + Peripheral Vascular Disease   Rhythm:regular Rate:Normal     Neuro/Psych PSYCHIATRIC DISORDERS Anxiety  Neuromuscular disease    GI/Hepatic GERD  ,  Endo/Other    Renal/GU      Musculoskeletal  (+) Arthritis ,   Abdominal   Peds  Hematology   Anesthesia Other Findings   Reproductive/Obstetrics                             Anesthesia Physical Anesthesia Plan  ASA: II  Anesthesia Plan: General   Post-op Pain Management:    Induction: Intravenous  PONV Risk Score and Plan: 3 and Ondansetron, Dexamethasone and Treatment may vary due to age or medical condition  Airway Management Planned: LMA  Additional Equipment:   Intra-op Plan:   Post-operative Plan:   Informed Consent: I have reviewed the patients History and Physical, chart, labs and discussed the procedure including the risks, benefits and alternatives for the proposed anesthesia with the patient or authorized representative who has indicated his/her understanding and acceptance.     Plan Discussed with: CRNA, Anesthesiologist and Surgeon  Anesthesia Plan Comments:         Anesthesia Quick Evaluation

## 2017-09-12 NOTE — Op Note (Signed)
09/12/2017  12:21 PM  PATIENT:  Brenda Leon    PRE-OPERATIVE DIAGNOSIS:  EXOTROPIA, recurrent  POST-OPERATIVE DIAGNOSIS:  same  PROCEDURE:  1.  Explore both medial rectus muscles and right lateral rectus muscle   2.  Left medial rectus muscle resection, 6.0 mm   3. Right medial rectus muscle resection, 6.70mm, with 4.72mm adjustable recession  SURGEON:  Derry Skill, MD  ANESTHESIA:   General  COMPLICATIONS: none  OPERATIVE PROCEDURE: After routine preoperative evaluation including informed consent, the patient was taken to the operating room where she was identified by me.  General anesthesia was induced without difficulty after placement of appropriate monitors.  The patient was prepped and draped in standard sterile fashion.  A lid speculum was placed in the right eye.  Through an inferonasal fornix incision through conjunctiva and tenons fascia, the right medial rectus muscle was engaged on a series of muscle hooks and cleared of its fascial attachments.  I felt that there was mild scarring of the conjunctiva, but the muscle itself did not appear to have been operated.  Through an inferotemporal fornix incision through conjunctiva and tenons fascia, the right lateral rectus muscle was engaged on a muscle hook.  I did not dissect the muscle enough to make a formal measurement, but based on the location of the hook the muscle appeared already to then recessed 10 mm or more, so I elected not to pursue further recession of the lateral rectus muscle.  The inferotemporal conjunctival incision was closed with a single 6-0 plain gut suture.  The lid speculum was transferred to the left eye, where through an inferonasal fornix incision through conjunctiva and tenons fascia, the medial rectus muscle was engaged on a series of muscle hooks and carefully cleared of its surrounding fascial attachments and scar tissue.  The muscle was found inserted 5 mm posterior to the limbus, but there is  evidence of previous surgery, suggesting that the muscle had previously been resected.  The muscle was spread between 2 self-retaining hooks.  A 2 mm bite was taken of the center of the muscle belly area measured distance of 6.0 mm posterior to the insertion.  Not was tied securely at this location.  The needle at each end of the double-arm suture was passed from the center of the muscle belly to the periphery, parallel to and 6.0 mm posterior to the insertion.  A locking bite was placed at each border of the muscle.  A resection clamp was placed on the muscle just anterior to the sutures.  The muscle was disinserted.  Each pole suture was passed posteriorly to anteriorly through the corresponding end of the muscle stump, then anteriorly posteriorly near the center of the stump, then posteriorly to anteriorly through the center of the muscle belly, just posterior to the previously placed knot.  The muscle was drawn up to the level of the original insertion.  The clamp was removed.  The suture ends were tied securely after making sure that there was no slack in the sutures.  Conjunctiva was closed with a single 6-0 plain gut suture.  The lid speculum was then transferred again to the right eye, where the medial rectus muscle was secured with sutures at a measured distance of 6.0 mm posterior to the insertion, clamped, and disinserted, as described for the left medial rectus muscle.  To effect an adjustable recession of the resected right medial rectus muscle, each pole suture was passed back into the original muscle stump  and a shallow the configuration, and crossed swords fashion.  The muscle was drawn up to the level of the original insertion.  The pole sutures were tied together approximately 10 cm above sclera, with the muscle held at the insertion.  Still holding the muscle at the insertion, the 2 pole sutures were joined with a needle driver and measured distance of 4.0 mm above sclera.  A noose knot was tied  around the pull sutures at this location.  The muscle was allowed to hang back until the noose not reached sclera, creating a 4.0 mm adjustable recession of the resected right medial rectus muscle.  A traction suture of 6-0 silk was placed at the nasal limbus of the right eye.  The conjunctival incision was very loosely opposed with a large loop of 6-0 plain gut, leaving conjunctival incision open to facilitate suture adjustment this afternoon in the office.  The pole, noose, conjunctival, and traction sutures were taped to the cheek with Steri-Strips.  TobraDex ointment was placed in each eye.  A sterile patch was placed over the right eye to protect the sutures.  The patient was awakened without difficulty and taken to the recovery room in stable condition, having suffered no intraoperative or immediate postoperative complications.  Derry Skill, MD

## 2017-09-12 NOTE — H&P (Signed)
Date of examination:  08-21-17  Indication for surgery: to straighten the eyes and allow some binocularity  Pertinent past medical history:  Past Medical History:  Diagnosis Date  . Anemia   . Arthritis   . Cataracts, bilateral   . Chicken pox   . Diverticulitis   . Elevated blood pressure reading   . GERD (gastroesophageal reflux disease)   . Lupus ([place])   . SLE (systemic lupus erythematosus) ([place])    diagnosed with skin biopsy and serology  . Thyroid disease     Pertinent ocular history:  LXT since childhood.  Strabismus surgery for XT at age 72 and at age 19, then eye exercises.  Noticing it more in recent years.  Interferes with reading, work Engineer, agricultural), and painting  Pertinent family history:  Family History  Problem Relation Age of Onset  . Aneurysm Mother        AAA  . Other Mother        varicose veins  . Hypertension Father   . Heart disease Father   . Heart attack Father   . Diabetes Sister   . Hyperlipidemia Sister   . Hypertension Sister   . Kidney disease Sister   . Hyperlipidemia Brother   . Hypertension Son   . Hyperlipidemia Son   . Hyperlipidemia Sister   . Breast cancer Neg Hx     General:  Healthy appearing patient in no distress.    Eyes:    Acuity [place] cc OD 20/20  OS 20/30  External: Within normal limits     Anterior segment: Within normal limits  X healed conj scars  temporally OU and nasally OS  Motility:   X(T)=15, vertically comitant; X(T)'=28 through bifocal; rotations normal  Fundus: Normal   deferred  Refraction: mod symm cyl OU and mild symm myopia OU    Heart: Regular rate and rhythm without murmur     Lungs: Clear to auscultation     Impression:Exotropia, recurrent, convergence insufficiency type  Plan: 1) Advance/resect right medial rectus muscle (appears unoperated)  2) re-recess right lateral rectus muscle, adjustable  2) If RLR is already too far recessed to allow further recession, re-resect left medial rectus  muscle.  In than case may do RMR adjustable  Derry Skill

## 2017-09-12 NOTE — Transfer of Care (Signed)
Immediate Anesthesia Transfer of Care Note  Patient: Brenda Leon  Procedure(s) Performed: REPAIR STRABISMUS BILATERAL (Bilateral Eye)  Patient Location: PACU  Anesthesia Type:General  Level of Consciousness: awake and sedated  Airway & Oxygen Therapy: Patient Spontanous Breathing and Patient connected to face mask oxygen  Post-op Assessment: Report given to RN and Post -op Vital signs reviewed and stable  Post vital signs: Reviewed and stable  Last Vitals:  Vitals Value Taken Time  BP    Temp    Pulse 87 09/12/2017 12:21 PM  Resp 13 09/12/2017 12:21 PM  SpO2 99 % 09/12/2017 12:21 PM  Vitals shown include unvalidated device data.  Last Pain:  Vitals:   09/12/17 0926  TempSrc: Oral  PainSc: 0-No pain         Complications: No apparent anesthesia complications

## 2017-09-12 NOTE — Anesthesia Procedure Notes (Signed)
Procedure Name: LMA Insertion Performed by: Mattew Chriswell W, CRNA Pre-anesthesia Checklist: Patient identified, Emergency Drugs available, Suction available and Patient being monitored Patient Re-evaluated:Patient Re-evaluated prior to induction Oxygen Delivery Method: Circle system utilized Preoxygenation: Pre-oxygenation with 100% oxygen Induction Type: IV induction Ventilation: Mask ventilation without difficulty LMA: LMA inserted LMA Size: 4.0 Number of attempts: 1 Placement Confirmation: positive ETCO2 Tube secured with: Tape Dental Injury: Teeth and Oropharynx as per pre-operative assessment        

## 2017-09-12 NOTE — H&P (Deleted)
  The note originally documented on this encounter has been moved the the encounter in which it belongs.  

## 2017-09-15 ENCOUNTER — Encounter (HOSPITAL_BASED_OUTPATIENT_CLINIC_OR_DEPARTMENT_OTHER): Payer: Self-pay | Admitting: Ophthalmology

## 2017-10-09 IMAGING — MG MM DIGITAL SCREENING BILAT W/ TOMO W/ CAD
8 of 15 series · 8 of 31 positions shown · non-contrast
Comparison: Previous exam(s).

CLINICAL DATA: Screening.

EXAM:
2D DIGITAL SCREENING BILATERAL MAMMOGRAM WITH CAD AND ADJUNCT TOMO

[L MLO (1 of 2)]
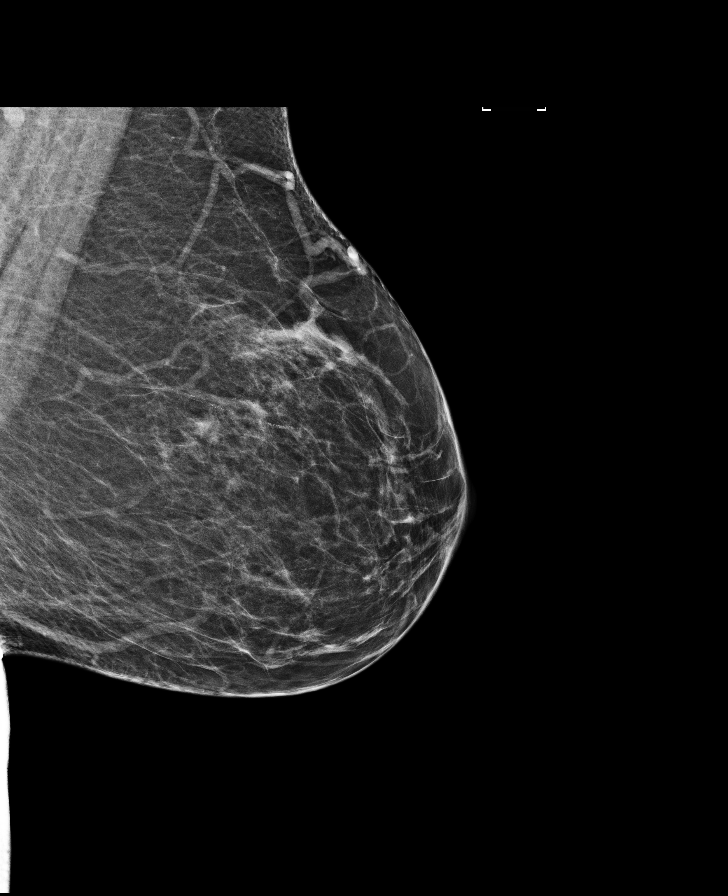

[R CC]
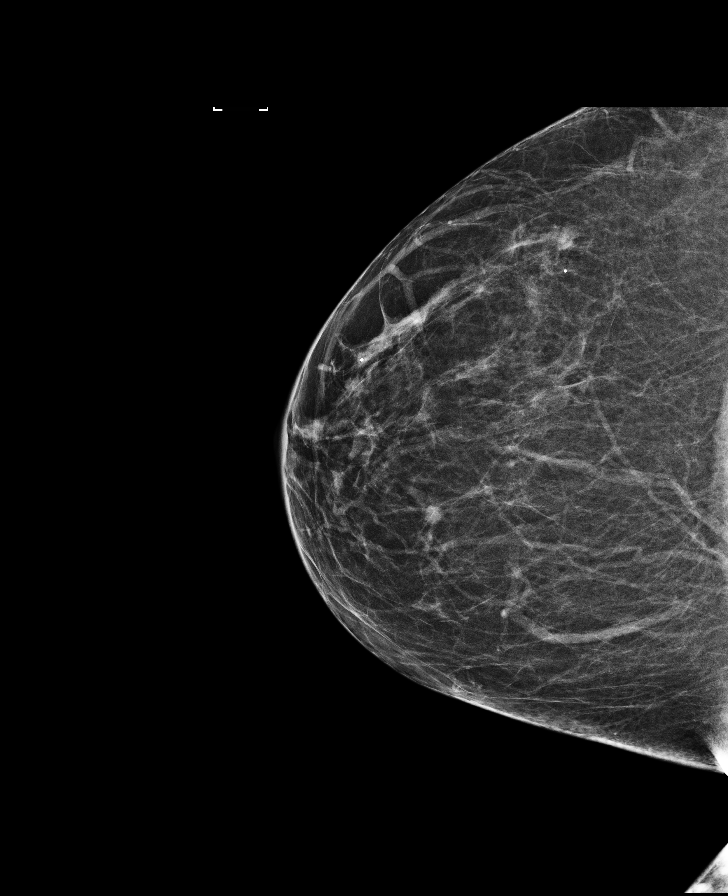

[L MLO synth-2D]
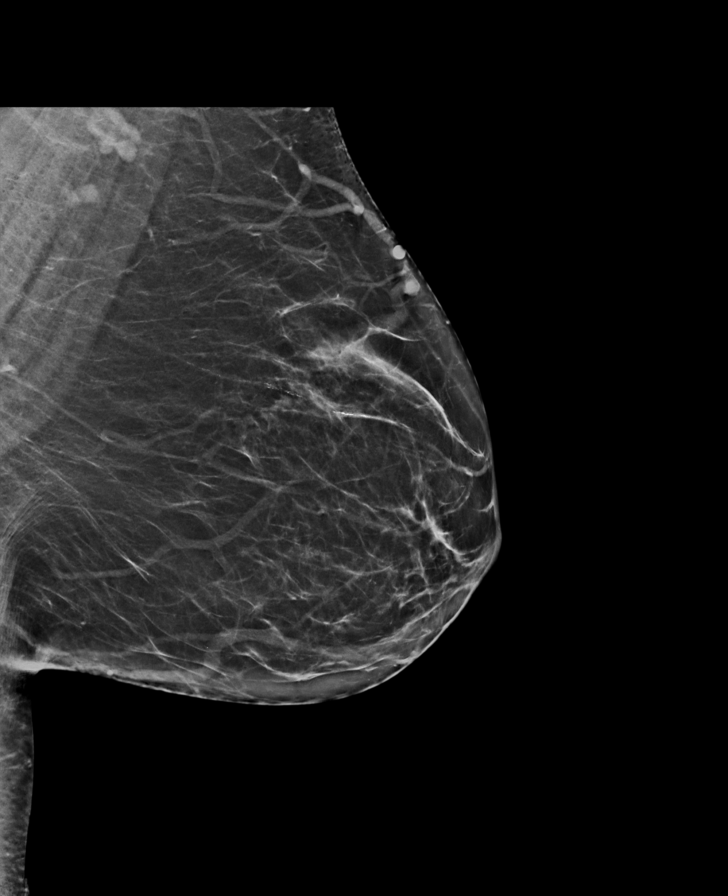

[R CC synth-2D]
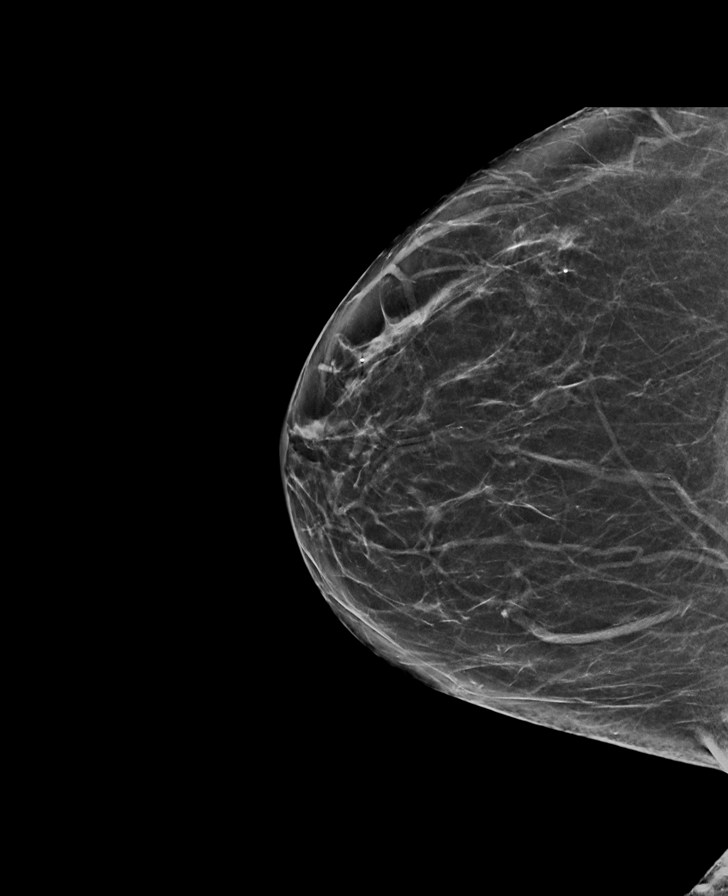

[L CC]
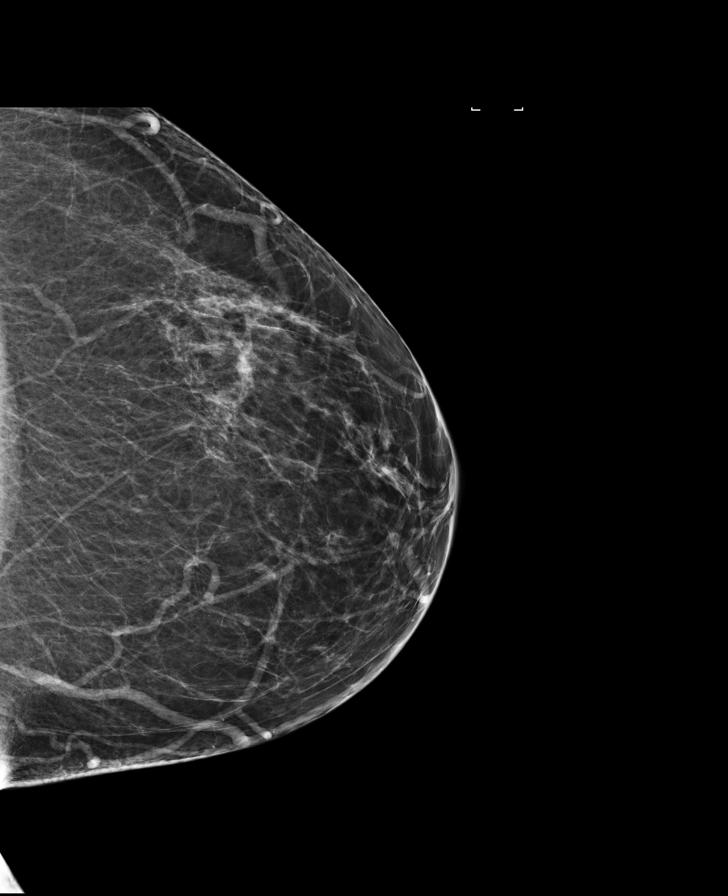

[R MLO synth-2D]
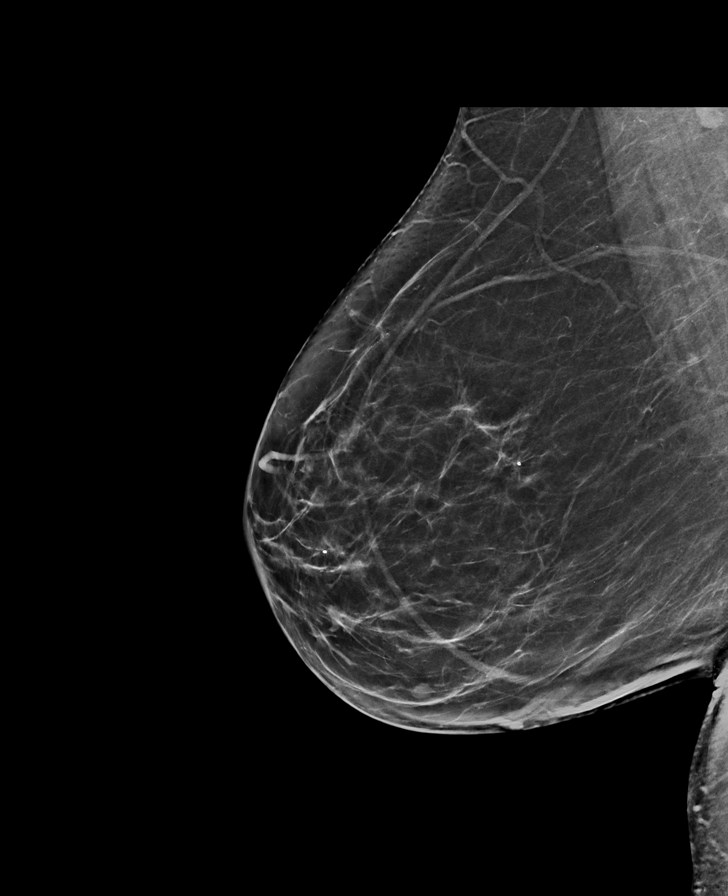

[L MLO (2 of 2)]
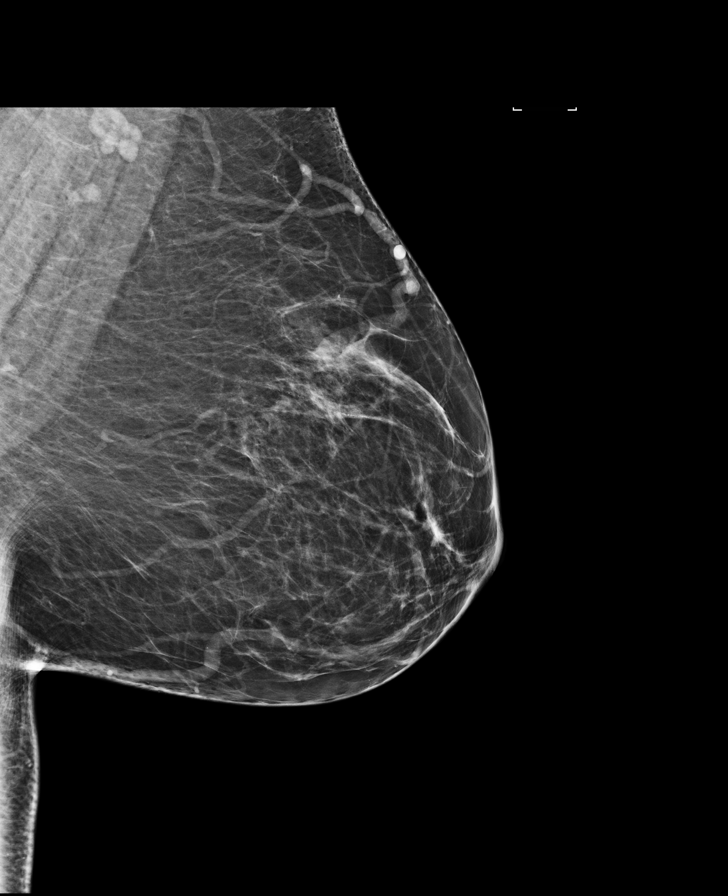

[L CC synth-2D]
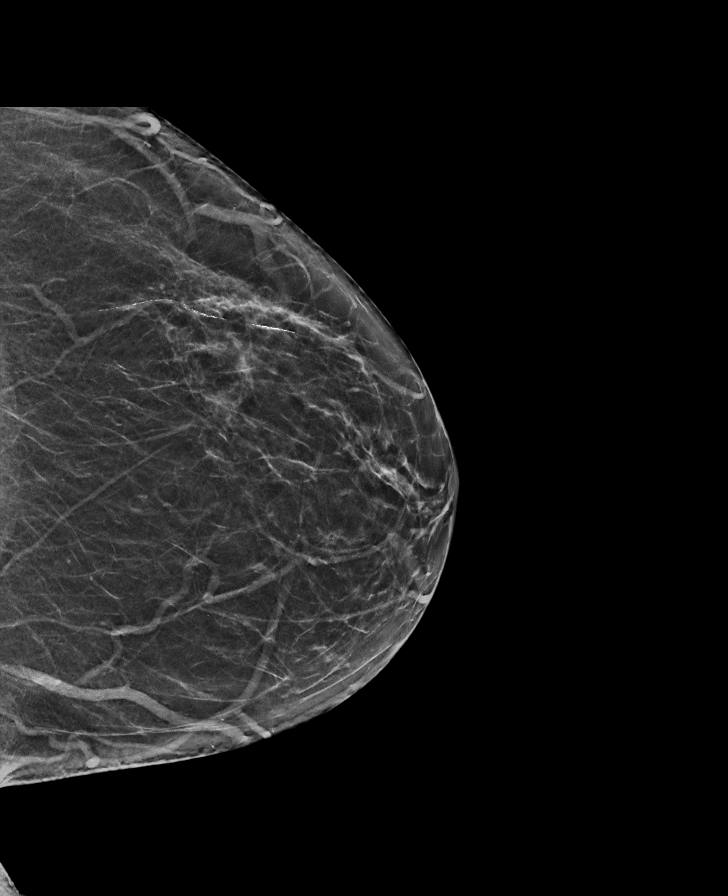

[8 of 31 positions shown; findings below may reference images not displayed]

ACR Breast Density Category b: There are scattered areas of
fibroglandular density.
FINDINGS: In the right breast, a possible mass warrants further evaluation. In
the left breast, no findings suspicious for malignancy. Images were
processed with CAD.
IMPRESSION: Further evaluation is suggested for possible mass in the right
breast.

RECOMMENDATION:
Ultrasound of the right breast. (Code:[id number])

The patient will be contacted regarding the findings, and additional
imaging will be scheduled.

BI-RADS CATEGORY  0: Incomplete. Need additional imaging evaluation
and/or prior mammograms for comparison.

## 2017-12-12 DIAGNOSIS — Z0100 Encounter for examination of eyes and vision without abnormal findings: Secondary | ICD-10-CM | POA: Diagnosis not present

## 2018-03-26 ENCOUNTER — Other Ambulatory Visit: Payer: Self-pay | Admitting: Internal Medicine

## 2018-03-26 DIAGNOSIS — Z1231 Encounter for screening mammogram for malignant neoplasm of breast: Secondary | ICD-10-CM

## 2018-04-07 DIAGNOSIS — Z961 Presence of intraocular lens: Secondary | ICD-10-CM | POA: Diagnosis not present

## 2018-04-07 DIAGNOSIS — H353131 Nonexudative age-related macular degeneration, bilateral, early dry stage: Secondary | ICD-10-CM | POA: Diagnosis not present

## 2018-04-07 DIAGNOSIS — H35372 Puckering of macula, left eye: Secondary | ICD-10-CM | POA: Diagnosis not present

## 2018-04-09 ENCOUNTER — Telehealth: Payer: Self-pay

## 2018-04-09 ENCOUNTER — Ambulatory Visit
Admission: RE | Admit: 2018-04-09 | Discharge: 2018-04-09 | Disposition: A | Discharge status: Home / Self Care | Payer: Medicare HMO | Source: Ambulatory Visit | Attending: Internal Medicine | Admitting: Internal Medicine

## 2018-04-09 DIAGNOSIS — Z1231 Encounter for screening mammogram for malignant neoplasm of breast: Secondary | ICD-10-CM

## 2018-04-09 NOTE — Telephone Encounter (Signed)
We haven't discussed her back issues since 2013.  she will need to be seen.  Offer her appt with NPs  And they can treat/make referral

## 2018-04-09 NOTE — Telephone Encounter (Signed)
Copied from [place] [id number]. Topic: Referral - Request for Referral >> Apr 09, 2018 12:17 PM Alanda Slim E wrote: Has patient seen PCP for this complaint? Unsure  *If NO, is insurance requiring patient see PCP for this issue before PCP can refer them? Referral for which specialty: orthopedic  Preferred provider/office: any office but Dr. Rennis Harding - can be in [facility] or [place] Reason for referral: bulging disk in back  Pt had an MRI in 2008 and may still have the CD if needed

## 2018-04-10 NOTE — Telephone Encounter (Signed)
Spoke with pt and informed her that she would need to be seen by a provider in our office in order to get the referral because she has not been seen for the back pain since 2013. Pt stated that she only wanted to see Dr. Derrel Nip and was willing to wait until she had an available appt. Pt has been scheduled for 04/30/2018. Pt is aware of appt date and time.

## 2018-04-30 ENCOUNTER — Ambulatory Visit: Payer: Self-pay | Admitting: Internal Medicine

## 2018-08-13 DIAGNOSIS — H5034 Intermittent alternating exotropia: Secondary | ICD-10-CM | POA: Diagnosis not present

## 2018-09-24 ENCOUNTER — Ambulatory Visit: Payer: Medicare HMO

## 2018-09-24 DIAGNOSIS — E7800 Pure hypercholesterolemia, unspecified: Secondary | ICD-10-CM

## 2018-09-24 DIAGNOSIS — Z0000 Encounter for general adult medical examination without abnormal findings: Secondary | ICD-10-CM | POA: Diagnosis not present

## 2018-09-24 DIAGNOSIS — R5383 Other fatigue: Secondary | ICD-10-CM

## 2018-09-24 NOTE — Patient Instructions (Addendum)
  Brenda Leon , Thank you for taking time to come for your Medicare Wellness Visit. I appreciate your ongoing commitment to your health goals. Please review the following plan we discussed and let me know if I can assist you in the future.   These are the goals we discussed: Goals      Patient Stated   . DIET - REDUCE SUGAR INTAKE (pt-stated)       This is a list of the screening recommended for you and due dates:  Health Maintenance  Topic Date Due  . Colon Cancer Screening  02/10/2015  . Flu Shot  09/26/2018  . Mammogram  04/09/2020  . Tetanus Vaccine  04/12/2020  . DEXA scan (bone density measurement)  Completed  .  Hepatitis C: One time screening is recommended by Center for Disease Control  (CDC) for  adults born from [birth year] through 1964.   Completed  . Pneumonia vaccines  Completed

## 2018-09-24 NOTE — Progress Notes (Signed)
Subjective:   KAYTELYN GLORE is a 73 y.o. female who presents for Medicare Annual (Subsequent) preventive examination.  Review of Systems:  No ROS.  Medicare Wellness Virtual Visit.  Visual/audio telehealth visit, UTA vital signs.   See social history for additional risk factors.   Cardiac Risk Factors include: advanced age (>40men, >78 women)     Objective:     Vitals: There were no vitals taken for this visit.  There is no height or weight on file to calculate BMI.  Advanced Directives 09/24/2018 09/12/2017 09/04/2017 10/19/2015  Does Patient Have a Medical Advance Directive? Yes No No No  Type of Paramedic of [place];Living will - - -  Does patient want to make changes to medical advance directive? No - Patient declined - - -  Copy of [place] in Chart? No - copy requested - - -  Would patient like information on creating a medical advance directive? - No - Patient declined - No - patient declined information    Tobacco Social History   Tobacco Use  Smoking Status Former Smoker  . Quit date: 02/25/1986  . Years since quitting: 32.6  Smokeless Tobacco Never Used     Counseling given: Not Answered   Clinical Intake:  Pre-visit preparation completed: Yes        Diabetes: No  How often do you need to have someone help you when you read instructions, pamphlets, or other written materials from your doctor or pharmacy?: 1 - Never  Interpreter Needed?: No     Past Medical History:  Diagnosis Date  . Anemia   . Arthritis   . Cataracts, bilateral   . Chicken pox   . Diverticulitis   . Elevated blood pressure reading   . GERD (gastroesophageal reflux disease)   . Lupus ([place])   . SLE (systemic lupus erythematosus) ([place])    diagnosed with skin biopsy and serology  . Thyroid disease    Past Surgical History:  Procedure Laterality Date  . ABDOMINAL HYSTERECTOMY  1987  . APPENDECTOMY  1952  . BREAST CYST ASPIRATION  Right 25 + yrs ago   neg  . CATARACT EXTRACTION, BILATERAL Bilateral 1995, 1996  . [place]   x2 1995 and 1996  . KNEE ARTHROSCOPY W/ ACL RECONSTRUCTION    . STRABISMUS SURGERY Bilateral 09/12/2017   Procedure: BILATERAL STRABISMUS REPAIR;  Surgeon: Everitt Amber, MD;  Location: [facility];  Service: Ophthalmology;  Laterality: Bilateral;  . TONSILLECTOMY AND ADENOIDECTOMY     Family History  Problem Relation Age of Onset  . Aneurysm Mother        AAA  . Other Mother        varicose veins  . Hypertension Father   . Heart disease Father   . Heart attack Father   . Diabetes Sister   . Hyperlipidemia Sister   . Hypertension Sister   . Kidney disease Sister   . Hyperlipidemia Brother   . Hypertension Son   . Hyperlipidemia Son   . Hyperlipidemia Sister   . Breast cancer Neg Hx    Social History   Socioeconomic History  . Marital status: Married    Spouse name: Not on file  . Number of children: Not on file  . Years of education: Not on file  . Highest education level: Not on file  Occupational History  . Not on file  Social Needs  . Financial resource strain: Not hard  at all  . Food insecurity    Worry: Never true    Inability: Never true  . Transportation needs    Medical: No    Non-medical: No  Tobacco Use  . Smoking status: Former Smoker    Quit date: 02/25/1986    Years since quitting: 32.6  . Smokeless tobacco: Never Used  Substance and Sexual Activity  . Alcohol use: Yes    Alcohol/week: 14.0 standard drinks    Types: 14 Standard drinks or equivalent per week    Comment: 2 beers daily  . Drug use: No  . Sexual activity: Yes  Lifestyle  . Physical activity    Days per week: 4 days    Minutes per session: 20 min  . Stress: Only a little  Relationships  . Social Herbalist on phone: Not on file    Gets together: Not on file    Attends religious service: Not on file    Active member of club or organization: Not on file     Attends meetings of clubs or organizations: Not on file    Relationship status: Not on file  Other Topics Concern  . Not on file  Social History Narrative  . Not on file    Outpatient Encounter Medications as of 09/24/2018  Medication Sig  . acetaminophen (TYLENOL) 500 MG tablet Take 500 mg by mouth every 6 (six) hours as needed.  Marland Kitchen b complex vitamins tablet Take 1 tablet by mouth daily.  . Cholecalciferol (VITAMIN D3) 1000 units CAPS Take by mouth.  . Flaxseed, Linseed, (FLAX SEED OIL PO) Take by mouth.  . Homeopathic Products ([employer]) GEL Apply topically.  . Horse Chestnut 300 MG CAPS Take 1 capsule by mouth daily.  . Multiple Vitamins-Minerals (PRESERVISION AREDS 2+MULTI VIT PO) Take 1 capsule by mouth.  Marland Kitchen omeprazole (PRILOSEC) 20 MG capsule Take by mouth.  . Red Yeast Rice Extract 600 MG CAPS Take 1 capsule (600 mg total) by mouth daily. (Patient taking differently: Take 1 capsule by mouth 2 (two) times daily. )  . ALPRAZolam (XANAX) 0.25 MG tablet Take by mouth.  . diazepam (VALIUM) 5 MG tablet Take 1 tablet (5 mg total) by mouth every 12 (twelve) hours as needed for anxiety or muscle spasms. (Patient not taking: Reported on 09/24/2018)   No facility-administered encounter medications on file as of 09/24/2018.     Activities of Daily Living In your present state of health, do you have any difficulty performing the following activities: 09/24/2018  Hearing? N  Vision? N  Difficulty concentrating or making decisions? N  Walking or climbing stairs? N  Dressing or bathing? N  Doing errands, shopping? N  Preparing Food and eating ? N  Using the Toilet? N  In the past six months, have you accidently leaked urine? N  Do you have problems with loss of bowel control? N  Managing your Medications? N  Managing your Finances? N  Housekeeping or managing your Housekeeping? N  Some recent data might be hidden    Patient Care Team: Crecencio Mc, MD as PCP - General (Internal  Medicine) Crecencio Mc, MD (Internal Medicine) Bary Castilla Forest Gleason, MD (General Surgery)    Assessment:   This is a routine wellness examination for [place].  I connected with patient 09/24/18 at 12:30 PM EDT by an audio enabled telemedicine application and verified that I am speaking with the correct person using two identifiers. Patient stated full name and DOB. Patient  gave permission to continue with virtual visit. Patient's location was at home and Nurse's location was at [place] office.   She has not see her pcp for 16 months. Follow up and fasting labs scheduled with pcp.   Health Screenings  Mammogram 3D- 03/2018 Colonoscopy - 01/2005 Cologuard- 10/2015; negative Bone Density - 03/2012 Glaucoma -none Hearing -demonstrates normal hearing during visit. TSH- 08/2015 Hepatitis C screening- 09/2015 Cholesterol - 05/2017 Dental- UTD Vision- visits within the last 12 months.  Social  Alcohol intake - yes      Smoking history- former Smokers in home? none Illicit drug use? none Exercise - stretching, home physical therapy exercises for the legs Diet - regular Sexually Active - yes BMI- discussed the importance of a healthy diet, water intake and the benefits of aerobic exercise.  Educational material provided.   Safety  Patient feels safe at home- yes Patient does have smoke detectors at home- yes Patient does wear sunscreen or protective clothing when in direct sunlight -yes Patient does wear seat belt when in a moving vehicle -yes Patient drives- yes  [id number] precautions and sickness symptoms discussed.   Activities of Daily Living Patient denies needing assistance with: driving, household chores, feeding themselves, getting from bed to chair, getting to the toilet, bathing/showering, dressing, managing money, or preparing meals.  No new identified risk were noted.    Depression Screen Patient denies losing interest in daily life, feeling hopeless, or crying easily  over simple problems.   Medication-taking as directed and without issues.   Fall Screen Patient denies being afraid of falling or falling in the last year.   Memory Screen Patient is alert.  Patient denies difficulty focusing, concentrating or misplacing items. Correctly identified the president of the [place], season and recall. Patient likes to read, paint and continues to work for brain stimulation.  Immunizations The following Immunizations were discussed: Influenza, shingles, pneumonia, and tetanus.   Other Providers Patient Care Team: Crecencio Mc, MD as PCP - General (Internal Medicine) Crecencio Mc, MD (Internal Medicine) Bary Castilla Forest Gleason, MD (General Surgery)  Exercise Activities and Dietary recommendations Current Exercise Habits: Home exercise routine, Type of exercise: stretching, Intensity: Mild  Goals      Patient Stated   . DIET - REDUCE SUGAR INTAKE (pt-stated)       Fall Risk Fall Risk  09/24/2018 06/17/2017 10/19/2015 07/02/2013 02/10/2012  Falls in the past year? 0 No No Yes No  Is the patient's home free of loose throw rugs in walkways, pet beds, electrical cords, etc? yes      Grab bars in the bathroom? yes      Handrails on the stairs? yes      Adequate lighting? yes  Depression Screen PHQ 2/9 Scores 09/24/2018 06/17/2017 10/19/2015 07/02/2013  PHQ - 2 Score 0 2 3 1   PHQ- 9 Score - 7 6 -     Cognitive Function MMSE - Mini Mental State Exam 10/19/2015  Orientation to time 5  Orientation to Place 5  Registration 3  Attention/ Calculation 5  Recall 3  Language- name 2 objects 2  Language- repeat 1  Language- follow 3 step command 3  Language- read & follow direction 1  Write a sentence 1  Copy design 1  Total score 30     6CIT Screen 09/24/2018  What Year? 0 points  What month? 0 points  What time? 0 points  Count back from 20 0 points  Months in reverse 0 points  Immunization History  Administered Date(s) Administered  . Influenza  Split 12/11/2011  . Influenza,inj,Quad PF,6+ Mos 02/23/2013, 01/05/2014, 11/03/2014, 10/19/2015  . Pneumococcal Conjugate-13 10/19/2015  . Pneumococcal Polysaccharide-23 02/10/2012  . Tdap 04/12/2010  . Zoster 01/15/2013   Screening Tests Health Maintenance  Topic Date Due  . COLONOSCOPY  02/10/2015  . INFLUENZA VACCINE  09/26/2018  . MAMMOGRAM  04/09/2020  . TETANUS/TDAP  04/12/2020  . DEXA SCAN  Completed  . Hepatitis C Screening  Completed  . PNA vac Low Risk Adult  Completed      Plan:    End of life planning; Advance aging; Advanced directives discussed.  Copy of current HCPOA/Living Will requested.    I have personally reviewed and noted the following in the patient's chart:   . Medical and social history . Use of alcohol, tobacco or illicit drugs  . Current medications and supplements . Functional ability and status . Nutritional status . Physical activity . Advanced directives . List of other physicians . Hospitalizations, surgeries, and ER visits in previous 12 months . Vitals . Screenings to include cognitive, depression, and falls . Referrals and appointments  In addition, I have reviewed and discussed with patient certain preventive protocols, quality metrics, and best practice recommendations. A written personalized care plan for preventive services as well as general preventive health recommendations were provided to patient.     Varney Biles, LPN  [id number]

## 2018-10-15 ENCOUNTER — Ambulatory Visit: Payer: Self-pay | Admitting: Internal Medicine

## 2018-10-29 ENCOUNTER — Other Ambulatory Visit (INDEPENDENT_AMBULATORY_CARE_PROVIDER_SITE_OTHER): Payer: Medicare HMO

## 2018-10-29 ENCOUNTER — Other Ambulatory Visit: Payer: Self-pay

## 2018-10-29 DIAGNOSIS — E7800 Pure hypercholesterolemia, unspecified: Secondary | ICD-10-CM | POA: Diagnosis not present

## 2018-10-29 DIAGNOSIS — R5383 Other fatigue: Secondary | ICD-10-CM

## 2018-10-29 LAB — CBC WITH DIFFERENTIAL/PLATELET
Basophils Absolute: 0 10*3/uL (ref 0.0–0.1)
Basophils Relative: 0.9 % (ref 0.0–3.0)
Eosinophils Absolute: 0.3 10*3/uL (ref 0.0–0.7)
Eosinophils Relative: 5.3 % — ABNORMAL HIGH (ref 0.0–5.0)
HCT: 37.5 % (ref 36.0–46.0)
Hemoglobin: 12.4 g/dL (ref 12.0–15.0)
Lymphocytes Relative: 24.5 % (ref 12.0–46.0)
Lymphs Abs: 1.3 10*3/uL (ref 0.7–4.0)
MCHC: 33 g/dL (ref 30.0–36.0)
MCV: 90.9 fl (ref 78.0–100.0)
Monocytes Absolute: 0.4 10*3/uL (ref 0.1–1.0)
Monocytes Relative: 6.7 % (ref 3.0–12.0)
Neutro Abs: 3.3 10*3/uL (ref 1.4–7.7)
Neutrophils Relative %: 62.6 % (ref 43.0–77.0)
Platelets: 235 10*3/uL (ref 150.0–400.0)
RBC: 4.13 Mil/uL (ref 3.87–5.11)
RDW: 13.7 % (ref 11.5–15.5)
WBC: 5.3 10*3/uL (ref 4.0–10.5)

## 2018-10-29 LAB — LIPID PANEL
Cholesterol: 194 mg/dL (ref 0–200)
HDL: 48 mg/dL (ref >39.00)
LDL Cholesterol: 124 mg/dL — ABNORMAL HIGH (ref 0–99)
NonHDL: 145.83
Total CHOL/HDL Ratio: 4
Triglycerides: 110 mg/dL (ref 0.0–149.0)
VLDL: 22 mg/dL (ref 0.0–40.0)

## 2018-10-29 LAB — COMPREHENSIVE METABOLIC PANEL
ALT: 19 U/L (ref 0–35)
AST: 16 U/L (ref 0–37)
Albumin: 4 g/dL (ref 3.5–5.2)
Alkaline Phosphatase: 58 U/L (ref 39–117)
BUN: 22 mg/dL (ref 6–23)
CO2: 29 mEq/L (ref 19–32)
Calcium: 9.1 mg/dL (ref 8.4–10.5)
Chloride: 107 mEq/L (ref 96–112)
Creatinine, Ser: 0.66 mg/dL (ref 0.40–1.20)
GFR: 88.01 mL/min (ref >60.00)
Glucose, Bld: 104 mg/dL — ABNORMAL HIGH (ref 70–99)
Potassium: 4.1 mEq/L (ref 3.5–5.1)
Sodium: 141 mEq/L (ref 135–145)
Total Bilirubin: 0.4 mg/dL (ref 0.2–1.2)
Total Protein: 7 g/dL (ref 6.0–8.3)

## 2018-10-29 LAB — TSH: TSH: 2.15 u[IU]/mL (ref 0.35–4.50)

## 2018-10-30 ENCOUNTER — Encounter: Payer: Self-pay | Admitting: Internal Medicine

## 2018-10-30 ENCOUNTER — Ambulatory Visit (INDEPENDENT_AMBULATORY_CARE_PROVIDER_SITE_OTHER): Payer: Medicare HMO | Admitting: Internal Medicine

## 2018-10-30 VITALS — Ht 68.0 in | Wt 195.0 lb

## 2018-10-30 DIAGNOSIS — Z780 Asymptomatic menopausal state: Secondary | ICD-10-CM

## 2018-10-30 MED ORDER — ZOSTER VAC RECOMB ADJUVANTED 50 MCG/0.5ML IM SUSR: 0.5000 mL | Freq: Once | INTRAMUSCULAR | 0 refills | Status: AC

## 2018-10-30 NOTE — Progress Notes (Signed)
Virtual Visit via [email or web address]  This visit type was conducted due to national recommendations for restrictions regarding the COVID-19 pandemic (e.g. social distancing).  This format is felt to be most appropriate for this patient at this time.  All issues noted in this document were discussed and addressed.  No physical exam was performed (except for noted visual exam findings with Video Visits).   I connected with@ on 10/30/18 at  8:30 AM EDT by a video enabled telemedicine application or telephone and verified that I am speaking with the correct person using two identifiers. Location patient: home Location provider: work or home office Persons participating in the virtual visit: patient, provider  I discussed the limitations, risks, security and privacy concerns of performing an evaluation and management service by telephone and the availability of in person appointments. I also discussed with the patient that there may be a patient responsible charge related to this service. The patient expressed understanding and agreed to proceed.   Reason for visit: follow up,  Medication refill   HPI:   73 yr old female   The patient has no signs or symptoms of COVID 19 infection (fever, cough, sore throat  or shortness of breath beyond what is typical for patient).  Patient denies contact with other persons with the above mentioned symptoms or with anyone confirmed to have COVID 19    ROS: See pertinent positives and negatives per HPI.  Past Medical History:  Diagnosis Date   Anemia    Arthritis    Cataracts, bilateral    Chicken pox    Diverticulitis    Elevated blood pressure reading    GERD (gastroesophageal reflux disease)    Lupus (HCC)    SLE (systemic lupus erythematosus) ([place])    diagnosed with skin biopsy and serology   Thyroid disease     Past Surgical History:  Procedure Laterality Date   ABDOMINAL HYSTERECTOMY  1987   APPENDECTOMY  1952   BREAST CYST ASPIRATION Right 25 + yrs  ago   neg   CATARACT EXTRACTION, BILATERAL Bilateral 1995, [place]   x2 1995 and [place] ARTHROSCOPY W/ ACL RECONSTRUCTION     STRABISMUS SURGERY Bilateral 09/12/2017   Procedure: BILATERAL STRABISMUS REPAIR;  Surgeon: Everitt Amber, MD;  Location: [facility];  Service: Ophthalmology;  Laterality: Bilateral;   TONSILLECTOMY AND ADENOIDECTOMY      Family History  Problem Relation Age of Onset   Aneurysm Mother        AAA   Other Mother        varicose veins   Hypertension Father    Heart disease Father    Heart attack Father    Diabetes Sister    Hyperlipidemia Sister    Hypertension Sister    Kidney disease Sister    Hyperlipidemia Brother    Hypertension Son    Hyperlipidemia Son    Hyperlipidemia Sister    Breast cancer Neg Hx     SOCIAL HX:    Current Outpatient Medications:    b complex vitamins tablet, Take 1 tablet by mouth daily., Disp: , Rfl:    Cholecalciferol (VITAMIN D3) 1000 units CAPS, Take by mouth., Disp: , Rfl:    Flaxseed, Linseed, (FLAX SEED OIL PO), Take by mouth., Disp: , Rfl:    Homeopathic Products (ARNICARE) GEL, Apply topically., Disp: , Rfl:    Horse Chestnut 300 MG CAPS, Take 1 capsule by mouth daily., Disp: , Rfl:  Multiple Vitamins-Minerals (PRESERVISION AREDS 2+MULTI VIT PO), Take 1 capsule by mouth., Disp: , Rfl:    omeprazole (PRILOSEC) 20 MG capsule, Take by mouth., Disp: , Rfl:    Red Yeast Rice Extract 600 MG CAPS, Take 1 capsule (600 mg total) by mouth daily. (Patient taking differently: Take 1 capsule by mouth 2 (two) times daily. ), Disp: 60 capsule, Rfl: 0   ALPRAZolam (XANAX) 0.25 MG tablet, Take by mouth., Disp: , Rfl:    diazepam (VALIUM) 5 MG tablet, Take 1 tablet (5 mg total) by mouth every 12 (twelve) hours as needed for anxiety or muscle spasms. (Patient not taking: Reported on 09/24/2018), Disp: 30 tablet, Rfl: 1  EXAM:  VITALS per patient if applicable:  GENERAL: alert, oriented,  appears well and in no acute distress  HEENT: atraumatic, conjunttiva clear, no obvious abnormalities on inspection of external nose and ears  NECK: normal movements of the head and neck  LUNGS: on inspection no signs of respiratory distress, breathing rate appears normal, no obvious gross SOB, gasping or wheezing  CV: no obvious cyanosis  MS: moves all visible extremities without noticeable abnormality  PSYCH/NEURO: pleasant and cooperative, no obvious depression or anxiety, speech and thought processing grossly intact  ASSESSMENT AND PLAN:  Discussed the following assessment and plan:  No diagnosis found.  No problem-specific Assessment & Plan notes found for this encounter.    I discussed the assessment and treatment plan with the patient. The patient was provided an opportunity to ask questions and all were answered. The patient agreed with the plan and demonstrated an understanding of the instructions.   The patient was advised to call back or seek an in-person evaluation if the symptoms worsen or if the condition fails to improve as anticipated.  I provided  minutes of non-face-to-face time during this encounter.   Crecencio Mc, MD

## 2018-11-02 ENCOUNTER — Telehealth: Payer: Self-pay | Admitting: Internal Medicine

## 2018-11-26 ENCOUNTER — Encounter: Payer: Self-pay | Admitting: Internal Medicine

## 2018-11-27 ENCOUNTER — Other Ambulatory Visit: Payer: Medicare HMO

## 2018-12-10 ENCOUNTER — Ambulatory Visit
Admission: RE | Admit: 2018-12-10 | Discharge: 2018-12-10 | Disposition: A | Discharge status: Home / Self Care | Payer: Medicare HMO | Source: Ambulatory Visit | Attending: Internal Medicine | Admitting: Internal Medicine

## 2018-12-10 DIAGNOSIS — Z780 Asymptomatic menopausal state: Secondary | ICD-10-CM | POA: Diagnosis not present

## 2018-12-10 DIAGNOSIS — M85852 Other specified disorders of bone density and structure, left thigh: Secondary | ICD-10-CM | POA: Diagnosis not present

## 2018-12-18 ENCOUNTER — Other Ambulatory Visit: Payer: Self-pay

## 2018-12-18 ENCOUNTER — Ambulatory Visit (INDEPENDENT_AMBULATORY_CARE_PROVIDER_SITE_OTHER): Payer: Medicare HMO

## 2018-12-18 DIAGNOSIS — Z23 Encounter for immunization: Secondary | ICD-10-CM | POA: Diagnosis not present

## 2019-01-06 ENCOUNTER — Other Ambulatory Visit: Payer: Self-pay

## 2019-01-07 ENCOUNTER — Telehealth: Payer: Self-pay

## 2019-01-07 NOTE — Telephone Encounter (Signed)
Copied from [place] [id number]. Topic: General - Other >> Jan 07, 2019  8:10 AM Rayann Heman wrote: Reason for CRM: pt called and stated that her fitness instructor tested positive for covid and pt was in contact with her last Tuesday. Pt would like to know if she should change her appointment to virtual and if she should be tested.

## 2019-01-08 ENCOUNTER — Ambulatory Visit (INDEPENDENT_AMBULATORY_CARE_PROVIDER_SITE_OTHER): Payer: Medicare HMO | Admitting: Internal Medicine

## 2019-01-08 ENCOUNTER — Encounter: Payer: Self-pay | Admitting: Internal Medicine

## 2019-01-08 ENCOUNTER — Other Ambulatory Visit: Payer: Self-pay

## 2019-01-08 VITALS — Ht 67.0 in | Wt 205.0 lb

## 2019-01-08 DIAGNOSIS — Z1211 Encounter for screening for malignant neoplasm of colon: Secondary | ICD-10-CM | POA: Diagnosis not present

## 2019-01-08 DIAGNOSIS — M5440 Lumbago with sciatica, unspecified side: Secondary | ICD-10-CM

## 2019-01-08 DIAGNOSIS — G8929 Other chronic pain: Secondary | ICD-10-CM

## 2019-01-08 DIAGNOSIS — Z20828 Contact with and (suspected) exposure to other viral communicable diseases: Secondary | ICD-10-CM

## 2019-01-08 DIAGNOSIS — Z20822 Contact with and (suspected) exposure to covid-19: Secondary | ICD-10-CM

## 2019-01-08 MED ORDER — ZOSTER VAC RECOMB ADJUVANTED 50 MCG/0.5ML IM SUSR: 0.5000 mL | Freq: Once | INTRAMUSCULAR | 0 refills | Status: AC

## 2019-01-08 NOTE — Progress Notes (Signed)
Virtual Visit via [email or web address] Note  This visit type was conducted due to national recommendations for restrictions regarding the COVID-19 pandemic (e.g. social distancing).  This format is felt to be most appropriate for this patient at this time.  All issues noted in this document were discussed and addressed.  No physical exam was performed (except for noted visual exam findings with Video Visits).   I connected with@ on 01/08/19 at  3:00 PM EST by a video enabled telemedicine application  and verified that I am speaking with the correct person using two identifiers. Location patient: home Location provider: work or home office Persons participating in the virtual visit: patient, provider  I discussed the limitations, risks, security and privacy concerns of performing an evaluation and management service by telephone and the availability of in person appointments. I also discussed with the patient that there may be a patient responsible charge related to this service. The patient expressed understanding and agreed to proceed.   Reason for visit: COVID 7 EXPOSURE   HPI:  73 yr old female presents with one day history of scratchy throat , non productive cough .  Denies fevers,  Body aches,  Loss of olfactory function.  Was exposed unknowingly to [place] personal trainer 3 days ago who taught class unmasked.   Sciatica:  Back pain has been chronic since 2013,  Managed with PT since then with occasional exacerbations .   ROS: See pertinent positives and negatives per HPI.  Past Medical History:  Diagnosis Date  . Anemia   . Arthritis   . Cataracts, bilateral   . Chicken pox   . Diverticulitis   . Elevated blood pressure reading   . GERD (gastroesophageal reflux disease)   . Lupus ([place])   . SLE (systemic lupus erythematosus) ([place])    diagnosed with skin biopsy and serology  . Thyroid disease     Past Surgical History:  Procedure Laterality Date  . ABDOMINAL HYSTERECTOMY  1987   . APPENDECTOMY  1952  . BREAST CYST ASPIRATION Right 25 + yrs ago   neg  . CATARACT EXTRACTION, BILATERAL Bilateral 1995, 1996  . [place]   x2 1995 and 1996  . KNEE ARTHROSCOPY W/ ACL RECONSTRUCTION    . STRABISMUS SURGERY Bilateral 09/12/2017   Procedure: BILATERAL STRABISMUS REPAIR;  Surgeon: Everitt Amber, MD;  Location: [facility];  Service: Ophthalmology;  Laterality: Bilateral;  . TONSILLECTOMY AND ADENOIDECTOMY      Family History  Problem Relation Age of Onset  . Aneurysm Mother        AAA  . Other Mother        varicose veins  . Hypertension Father   . Heart disease Father   . Heart attack Father   . Diabetes Sister   . Hyperlipidemia Sister   . Hypertension Sister   . Kidney disease Sister   . Hyperlipidemia Brother   . Hypertension Son   . Hyperlipidemia Son   . Hyperlipidemia Sister   . Breast cancer Neg Hx     SOCIAL HX:  reports that she quit smoking about 32 years ago. She has never used smokeless tobacco. She reports current alcohol use of about 14.0 standard drinks of alcohol per week. She reports that she does not use drugs.  Current Outpatient Medications:  .  ALPRAZolam (XANAX) 0.25 MG tablet, Take by mouth., Disp: , Rfl:  .  b complex vitamins tablet, Take 1 tablet by mouth daily., Disp: , Rfl:  .  Cholecalciferol (VITAMIN D3) 1000 units CAPS, Take by mouth., Disp: , Rfl:  .  diazepam (VALIUM) 5 MG tablet, Take 1 tablet (5 mg total) by mouth every 12 (twelve) hours as needed for anxiety or muscle spasms., Disp: 30 tablet, Rfl: 1 .  Flaxseed, Linseed, (FLAX SEED OIL PO), Take by mouth., Disp: , Rfl:  .  Homeopathic Products (ARNICARE) GEL, Apply topically., Disp: , Rfl:  .  Horse Chestnut 300 MG CAPS, Take 1 capsule by mouth daily., Disp: , Rfl:  .  Multiple Vitamins-Minerals (PRESERVISION AREDS 2+MULTI VIT PO), Take 1 capsule by mouth., Disp: , Rfl:  .  omeprazole (PRILOSEC) 20 MG capsule, Take by mouth., Disp: , Rfl:  .  Red  Yeast Rice Extract 600 MG CAPS, Take 1 capsule (600 mg total) by mouth daily. (Patient taking differently: Take 1 capsule by mouth 2 (two) times daily. ), Disp: 60 capsule, Rfl: 0  EXAM:  VITALS per patient if applicable:  GENERAL: alert, oriented, appears well and in no acute distress  HEENT: atraumatic, conjunttiva clear, no obvious abnormalities on inspection of external nose and ears  NECK: normal movements of the head and neck  LUNGS: on inspection no signs of respiratory distress, breathing rate appears normal, no obvious gross SOB, gasping or wheezing  CV: no obvious cyanosis  MS: moves all visible extremities without noticeable abnormality  PSYCH/NEURO: pleasant and cooperative, no obvious depression or anxiety, speech and thought processing grossly intact  ASSESSMENT AND PLAN:  Discussed the following assessment and plan:  Colon cancer screening - Plan: Cologuard  Close exposure to COVID-19 virus  Chronic low back pain with sciatica, sciatica laterality unspecified, unspecified back pain laterality  Close exposure to COVID-19 virus Current symptoms are mild and may not be secondary to COVID infection.  Advised to quarantine for a minimum of 7 days given known exposure, and testing advised on Monday Nov 16  Lumbago with sciatica Secondary to workmen's comp related injury in 2013.  Encouraged to resume home PT exercises    I discussed the assessment and treatment plan with the patient. The patient was provided an opportunity to ask questions and all were answered. The patient agreed with the plan and demonstrated an understanding of the instructions.   The patient was advised to call back or seek an in-person evaluation if the symptoms worsen or if the condition fails to improve as anticipated.   I provided  25 minutes of non-face-to-face time during this encounter reviewing patient's current problems and post surgeries.  Providing counseling on the above mentioned  problems , and coordination  of care . Crecencio Mc, MD

## 2019-01-08 NOTE — Patient Instructions (Signed)
If you have contact with a patient who is positive for   COVID-19  you should  self quarantine  in a "modified" protocol; that means you should  limit your activities to  Home  For 7 days from the exposure .  Follow the common sense and not so common sense rules that prevail (MASKING, 6 foot distancing at home ,  etc) .  Test on Monday at [facility],  Or over the weekend at an Urgent Care.    Your  test results will likely not be available earlier than a 2 day turn around, so until they are NEGATIVE,  you should follow the same plan of modified self quarantine .   If the test is NEGATIVE and you are still having symptoms,  You should quarantine until symptoms have improved

## 2019-01-10 DIAGNOSIS — U071 COVID-19: Secondary | ICD-10-CM | POA: Insufficient documentation

## 2019-01-10 DIAGNOSIS — Z8616 Personal history of COVID-19: Secondary | ICD-10-CM | POA: Insufficient documentation

## 2019-01-10 NOTE — Assessment & Plan Note (Signed)
Secondary to workmen's comp related injury in 2013.  Encouraged to resume home PT exercises

## 2019-01-10 NOTE — Assessment & Plan Note (Signed)
Current symptoms are mild and may not be secondary to COVID infection.  Advised to quarantine for a minimum of 7 days given known exposure, and testing advised on Monday Nov 16

## 2019-01-11 ENCOUNTER — Other Ambulatory Visit: Payer: Self-pay

## 2019-01-11 DIAGNOSIS — Z20822 Contact with and (suspected) exposure to covid-19: Secondary | ICD-10-CM

## 2019-01-11 NOTE — Telephone Encounter (Signed)
Per Wynelle Beckmann the Covid 19 testing centers new hours effective 11.9.2020 until further notice will be 10am -3pm at [facility] , [facility], and [facility].

## 2019-01-12 LAB — NOVEL CORONAVIRUS, NAA: SARS-CoV-2, NAA: DETECTED — AB

## 2019-01-12 LAB — INPATIENT

## 2019-01-15 ENCOUNTER — Telehealth: Payer: Self-pay

## 2019-01-15 MED ORDER — ALBUTEROL SULFATE HFA 108 (90 BASE) MCG/ACT IN AERS: 2.0000 | INHALATION_SPRAY | Freq: Four times a day (QID) | RESPIRATORY_TRACT | 0 refills | Status: DC | PRN

## 2019-01-15 MED ORDER — HYDROCOD POLST-CPM POLST ER 10-8 MG/5ML PO SUER: 5.0000 mL | Freq: Two times a day (BID) | ORAL | 0 refills | Status: DC | PRN

## 2019-01-15 NOTE — Telephone Encounter (Signed)
I have sent albuterol inhaler and tussionex cough syrup to [facility] .    The tussionex is STRONG,  And not only will suppress the cough but may make you constipated.  Regards,   Deborra Medina, MD

## 2019-01-15 NOTE — Telephone Encounter (Signed)
Copied from [place] [id number]. Topic: General - Other >> Jan 15, 2019  9:30 AM Yvette Rack wrote: Reason for CRM: Pt stated she was diagnosed with Covid-19 and she her cough and fatigue has gotten worst. Pt stated she would like to know if Dr. Derrel Nip could prescribed an inhaler. Pt requests that the Rx for an inhaler be sent to [place], [place]

## 2019-01-18 ENCOUNTER — Telehealth: Payer: Self-pay | Admitting: Internal Medicine

## 2019-01-18 MED ORDER — CHERATUSSIN AC 100-10 MG/5ML PO SOLN: 5.0000 mL | Freq: Three times a day (TID) | ORAL | 0 refills | Status: DC | PRN

## 2019-01-18 NOTE — Telephone Encounter (Signed)
Patient is calling to report the chlorpheniramine-HYDROcodone [facility]) 10-8 MG/5ML SUER [id number]   Is a $106.00. Can Dr. Derrel Nip call in another medication please?  Preferred [place]. [place], [place]

## 2019-01-18 NOTE — Telephone Encounter (Signed)
Pt is aware.  

## 2019-01-18 NOTE — Telephone Encounter (Signed)
cheratussin sent in its place

## 2019-01-22 ENCOUNTER — Ambulatory Visit: Payer: Self-pay

## 2019-01-22 ENCOUNTER — Encounter: Payer: Self-pay | Admitting: Radiology

## 2019-01-22 ENCOUNTER — Emergency Department
Admission: EM | Admit: 2019-01-22 | Discharge: 2019-01-22 | Disposition: A | Discharge status: Home / Self Care | Payer: Medicare HMO | Attending: Emergency Medicine | Admitting: Emergency Medicine

## 2019-01-22 ENCOUNTER — Emergency Department: Payer: Medicare HMO

## 2019-01-22 ENCOUNTER — Other Ambulatory Visit: Payer: Self-pay

## 2019-01-22 DIAGNOSIS — R079 Chest pain, unspecified: Secondary | ICD-10-CM | POA: Insufficient documentation

## 2019-01-22 DIAGNOSIS — R0602 Shortness of breath: Secondary | ICD-10-CM | POA: Diagnosis not present

## 2019-01-22 DIAGNOSIS — Z87891 Personal history of nicotine dependence: Secondary | ICD-10-CM | POA: Diagnosis not present

## 2019-01-22 DIAGNOSIS — U071 COVID-19: Secondary | ICD-10-CM

## 2019-01-22 DIAGNOSIS — Z79899 Other long term (current) drug therapy: Secondary | ICD-10-CM | POA: Diagnosis not present

## 2019-01-22 DIAGNOSIS — M549 Dorsalgia, unspecified: Secondary | ICD-10-CM | POA: Insufficient documentation

## 2019-01-22 LAB — BASIC METABOLIC PANEL
Anion gap: 12 (ref 5–15)
BUN: 12 mg/dL (ref 8–23)
CO2: 23 mmol/L (ref 22–32)
Calcium: 8.1 mg/dL — ABNORMAL LOW (ref 8.9–10.3)
Chloride: 104 mmol/L (ref 98–111)
Creatinine, Ser: 0.51 mg/dL (ref 0.44–1.00)
GFR calc Af Amer: >60 mL/min (ref >60)
GFR calc non Af Amer: >60 mL/min (ref >60)
Glucose, Bld: 100 mg/dL — ABNORMAL HIGH (ref 70–99)
Potassium: 3.2 mmol/L — ABNORMAL LOW (ref 3.5–5.1)
Sodium: 139 mmol/L (ref 135–145)

## 2019-01-22 LAB — CBC
HCT: 33.6 % — ABNORMAL LOW (ref 36.0–46.0)
Hemoglobin: 11 g/dL — ABNORMAL LOW (ref 12.0–15.0)
MCH: 29 pg (ref 26.0–34.0)
MCHC: 32.7 g/dL (ref 30.0–36.0)
MCV: 88.7 fL (ref 80.0–100.0)
Platelets: 199 10*3/uL (ref 150–400)
RBC: 3.79 MIL/uL — ABNORMAL LOW (ref 3.87–5.11)
RDW: 13.1 % (ref 11.5–15.5)
WBC: 4 10*3/uL (ref 4.0–10.5)
nRBC: 0 % (ref 0.0–0.2)

## 2019-01-22 LAB — FIBRIN DERIVATIVES D-DIMER (ARMC ONLY): Fibrin derivatives D-dimer (ARMC): 1008.12 ng/mL (FEU) — ABNORMAL HIGH (ref 0.00–499.00)

## 2019-01-22 MED ORDER — PREDNISONE 20 MG PO TABS: 40.0000 mg | ORAL_TABLET | Freq: Every day | ORAL | 0 refills | Status: DC

## 2019-01-22 MED ORDER — DEXAMETHASONE SODIUM PHOSPHATE 10 MG/ML IJ SOLN
10.0000 mg | Freq: Once | INTRAMUSCULAR | Status: AC
  Administered 2019-01-22: 10 mg via INTRAVENOUS
  Filled 2019-01-22: qty 1

## 2019-01-22 MED ORDER — IOHEXOL 350 MG/ML SOLN
75.0000 mL | Freq: Once | INTRAVENOUS | Status: AC | PRN
  Administered 2019-01-22: 75 mL via INTRAVENOUS

## 2019-01-22 NOTE — ED Provider Notes (Signed)
[facility] Emergency Department Provider Note   ____________________________________________   I have reviewed the triage vital signs and the nursing notes.   HISTORY  Chief Complaint Shortness of Breath   History limited by: Not Limited   HPI Brenda Leon is a 73 y.o. female who presents to the emergency department today because of concern for shortness of breath in the setting of COVID. Tested positive roughly 10 days ago, has been having symptoms for roughly 2 weeks. The patient says that her primary care doctor prescribed her an albuterol inhaler and cough medicine. Both only give minimal relief. Shortness of breath became acutely worse yesterday. It has been accompanied by chest pain and back pain. She has had fevers. Denies any history of previous lung disease.   Records reviewed. Per medical record review patient has a history of GERD, recent COVID positive status.   Past Medical History:  Diagnosis Date  . Anemia   . Arthritis   . Cataracts, bilateral   . Chicken pox   . Diverticulitis   . Elevated blood pressure reading   . GERD (gastroesophageal reflux disease)   . Lupus ([place])   . SLE (systemic lupus erythematosus) ([place])    diagnosed with skin biopsy and serology  . Thyroid disease     Patient Active Problem List   Diagnosis Date Noted  . Close exposure to COVID-19 virus 01/10/2019  . TMJ (dislocation of temporomandibular joint), initial encounter 06/19/2017  . Functional systolic murmur [id number]  . Hip pain 09/03/2015  . 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 Varicose veins of both lower extremities with pain 07/02/2013  . Postmenopausal atrophic vaginitis 05/24/2012  . Generalized anxiety disorder 05/24/2012  . Routine general medical examination at a health care facility 05/24/2012  . Pain in limb 05/18/2012  . Leg pain, diffuse 02/10/2012  . SLE (systemic lupus erythematosus) ([place]) 02/10/2012  . GERD (gastroesophageal  reflux disease) 02/10/2012  . Lumbago with sciatica 02/10/2012  . Hyperlipidemia with target LDL less than 130 02/10/2012    Past Surgical History:  Procedure Laterality Date  . ABDOMINAL HYSTERECTOMY  1987  . APPENDECTOMY  1952  . BREAST CYST ASPIRATION Right 25 + yrs ago   neg  . CATARACT EXTRACTION, BILATERAL Bilateral 1995, 1996  . [place]   x2 1995 and 1996  . KNEE ARTHROSCOPY W/ ACL RECONSTRUCTION    . STRABISMUS SURGERY Bilateral 09/12/2017   Procedure: BILATERAL STRABISMUS REPAIR;  Surgeon: Everitt Amber, MD;  Location: [facility];  Service: Ophthalmology;  Laterality: Bilateral;  . TONSILLECTOMY AND ADENOIDECTOMY      Prior to Admission medications   Medication Sig Start Date End Date Taking? Authorizing Provider  albuterol (VENTOLIN HFA) 108 (90 Base) MCG/ACT inhaler Inhale 2 puffs into the lungs every 6 (six) hours as needed for wheezing or shortness of breath. 01/15/19   Crecencio Mc, MD  ALPRAZolam Duanne Moron) 0.25 MG tablet Take by mouth.    [provider]  b complex vitamins tablet Take 1 tablet by mouth daily.    [provider]  chlorpheniramine-HYDROcodone (TUSSIONEX PENNKINETIC ER) 10-8 MG/5ML SUER Take 5 mLs by mouth every 12 (twelve) hours as needed. 01/15/19   Crecencio Mc, MD  Cholecalciferol (VITAMIN D3) 1000 units CAPS Take by mouth.    [provider]  diazepam (VALIUM) 5 MG tablet Take 1 tablet (5 mg total) by mouth every 12 (twelve) hours as needed for anxiety or muscle spasms. 06/17/17  Crecencio Mc, MD  Flaxseed, Linseed, (FLAX SEED OIL PO) Take by mouth.    [provider]  guaiFENesin-codeine (CHERATUSSIN AC) 100-10 MG/5ML syrup Take 5 mLs by mouth 3 (three) times daily as needed for cough. 01/18/19   Crecencio Mc, MD  Homeopathic Products (ARNICARE) GEL Apply topically.    [provider]  Horse Chestnut 300 MG CAPS Take 1 capsule by mouth daily.    [provider]   Multiple Vitamins-Minerals (PRESERVISION AREDS 2+MULTI VIT PO) Take 1 capsule by mouth.    [provider]  omeprazole (PRILOSEC) 20 MG capsule Take by mouth.    [provider]  Red Yeast Rice Extract 600 MG CAPS Take 1 capsule (600 mg total) by mouth daily. Patient taking differently: Take 1 capsule by mouth 2 (two) times daily.  02/10/12   Crecencio Mc, MD    Allergies Patient has no known allergies.  Family History  Problem Relation Age of Onset  . Aneurysm Mother        AAA  . Other Mother        varicose veins  . Hypertension Father   . Heart disease Father   . Heart attack Father   . Diabetes Sister   . Hyperlipidemia Sister   . Hypertension Sister   . Kidney disease Sister   . Hyperlipidemia Brother   . Hypertension Son   . Hyperlipidemia Son   . Hyperlipidemia Sister   . Breast cancer Neg Hx     Social History Social History   Tobacco Use  . Smoking status: Former Smoker    Quit date: 02/25/1986    Years since quitting: 32.9  . Smokeless tobacco: Never Used  Substance Use Topics  . Alcohol use: Yes    Alcohol/week: 14.0 standard drinks    Types: 14 Standard drinks or equivalent per week    Comment: 2 beers daily  . Drug use: No    Review of Systems Constitutional: Positive for fever Eyes: No visual changes. ENT: Positive for loss of taste Cardiovascular: Positive for chest pain. Respiratory: Positive for shortness of breath. Gastrointestinal: No abdominal pain.  No nausea, no vomiting.  No diarrhea.   Genitourinary: Negative for dysuria. Musculoskeletal: Positive for back pain. Skin: Negative for rash. Neurological: Negative for headaches, focal weakness or numbness.  ____________________________________________   PHYSICAL EXAM:  VITAL SIGNS: ED Triage Vitals  Enc Vitals Group     BP 01/22/19 1437 (!) 146/55     Pulse Rate 01/22/19 1437 82     Resp 01/22/19 1437 (!) 22     Temp 01/22/19 1437 99.7 F (37.6 C)     Temp  Source 01/22/19 1437 Oral     SpO2 01/22/19 1437 94 %     Weight 01/22/19 1433 195 lb (88.5 kg)     Height 01/22/19 1433 5\' 7"  (1.702 m)     Head Circumference --      Peak Flow --      Pain Score 01/22/19 1432 4   Constitutional: Alert and oriented.  Eyes: Conjunctivae are normal.  ENT      Head: Normocephalic and atraumatic.      Nose: No congestion/rhinnorhea.      Mouth/Throat: Mucous membranes are moist.      Neck: No stridor. Hematological/Lymphatic/Immunilogical: No cervical lymphadenopathy. Cardiovascular: Normal rate, regular rhythm.  No murmurs, rubs, or gallops.  Respiratory: Normal respiratory effort without tachypnea nor retractions. Breath sounds are clear and equal bilaterally. No wheezes/rales/rhonchi.  Gastrointestinal: Soft and non tender. No rebound. No guarding.  Genitourinary: Deferred Musculoskeletal: Normal range of motion in all extremities. No lower extremity edema. Neurologic:  Normal speech and language. No gross focal neurologic deficits are appreciated.  Skin:  Skin is warm, dry and intact. No rash noted. Psychiatric: Mood and affect are normal. Speech and behavior are normal. Patient exhibits appropriate insight and judgment.  ____________________________________________    LABS (pertinent positives/negatives)  CBC wbc 4.0, hgb 11.0, plt 199 D-dimer 1008.12 BMP wnl except k 3.2, glu 100, ca 8.1  ____________________________________________   EKG  I, Nance Pear, attending physician, personally viewed and interpreted this EKG  EKG Time: 1442 Rate: 85 Rhythm: sinus rhythm with 1st degree av block Axis: normal Intervals: qtc 454 QRS: narrow ST changes: no st elevation Impression: abnormal ekg   ____________________________________________    RADIOLOGY  CXR Multifocal pneumonia  ____________________________________________   PROCEDURES  Procedures  ____________________________________________   INITIAL IMPRESSION /  ASSESSMENT AND PLAN / ED COURSE  Pertinent labs & imaging results that were available during my care of the patient were reviewed by me and considered in my medical decision making (see chart for details).   Patient presented to the emergency department today because of concern for increasing shortness of breath and chest pain in the setting of COVID. Patient has been using cough medicine and inhaler used by PCP without significant relief. Patient not hypoxic on initial vital signs. No leukocytosis. X-ray is consistent with COVID. Given acute worsening of shortness of breath and chest pain/back pain did have concern for possible blood clot. D-dimer was elevated. Will obtain CT angio  ___________________________________________   FINAL CLINICAL IMPRESSION(S) / ED DIAGNOSES  Final diagnoses:  Shortness of breath  COVID-19     Note: This dictation was prepared with Dragon dictation. Any transcriptional errors that result from this process are unintentional     Nance Pear, MD 01/22/19 2037

## 2019-01-22 NOTE — ED Triage Notes (Signed)
Pt states she was diagnosed covid + on 01/11/19, worsening shob over the past 24 hours, pursed lip breathing in triage, coughing and fever "non-stop" per pt. NAD at this time, sats 94-95% in triage.

## 2019-01-22 NOTE — ED Notes (Signed)
Pt BP confirmed with Paduchowski, MD. Cleared to go home

## 2019-01-22 NOTE — ED Notes (Signed)
md into speak with pt

## 2019-01-22 NOTE — Telephone Encounter (Signed)
Returned call to pt. Reported the cough medication is not working.  Reported she having intermittent short chest pain, mild shortness of breath, and persistent cough.  Reported she has worsened in past 24 hrs.  Pt. Having to pause when talking to take deep breath.  Stated she feels like her breathing has become more shallow.  Also described that she feels like she "has a lump in throat", and has coughed up some pink mucus x 1.  Advised to go to the ER due to increased shortness of breath and worsening cough.  Advised to proceed to ER now and nurse will call ahead to the hosp.  Called  [facility]; spoke with Triage nurse, Colletta Maryland.  Informed that pt. Was advised to come into ER; advised COVID positive and sx's of increased shortness of breath, and cough.  Verb. Understanding.   Reason for Disposition . MILD difficulty breathing (e.g., minimal/no SOB at rest, SOB with walking, pulse <100)  Answer Assessment - Initial Assessment Questions 1. COVID-19 DIAGNOSIS: "Who made your Coronavirus (COVID-19) diagnosis?" "Was it confirmed by a positive lab test?" If not diagnosed by a HCP, ask "Are there lots of cases (community spread) where you live?" (See public health department website, if unsure)     Tested positive on 01/11/19 2. COVID-19 EXPOSURE: "Was there any known exposure to COVID before the symptoms began?" CDC Definition of close contact: within 6 feet (2 meters) for a total of 15 minutes or more over a 24-hour period.      *No Answer* 3. ONSET: "When did the COVID-19 symptoms start?"      *No Answer* 4. WORST SYMPTOM: "What is your worst symptom?" (e.g., cough, fever, shortness of breath, muscle aches)     Worse in past 24 hrs.- cough  5. COUGH: "Do you have a cough?" If so, ask: "How bad is the cough?"       Yes 6. FEVER: "Do you have a fever?" If so, ask: "What is your temperature, how was it measured, and when did it start?"     99-100.9 7. RESPIRATORY STATUS: "Describe your  breathing?" (e.g., shortness of breath, wheezing, unable to speak)      Shortness of breath- stated "more shallow"   8. BETTER-SAME-WORSE: "Are you getting better, staying the same or getting worse compared to yesterday?"  If getting worse, ask, "In what way?"     Feels worse today 9. HIGH RISK DISEASE: "Do you have any chronic medical problems?" (e.g., asthma, heart or lung disease, weak immune system, obesity, etc.)     *No Answer* 10. PREGNANCY: "Is there any chance you are pregnant?" "When was your last menstrual period?"       N/a  11. OTHER SYMPTOMS: "Do you have any other symptoms?"  (e.g., chills, fatigue, headache, loss of smell or taste, muscle pain, sore throat; new loss of smell or taste especially support the diagnosis of COVID-19)       Feels that her breathing is more shallow today.  Having intermittent chest discomfort, but feels it is related to the persistent cough.  Coughed up pink mucus x 1.  Protocols used: CORONAVIRUS ([id number]) DIAGNOSED OR SUSPECTED-A-AH Message from Luciana Axe sent at 01/22/2019 10:27 AM EST  Patient is calling because she has COVID the cheratussin that Dr. Derrel Nip sent in, is not working. The patient is using her inhaler - And is not feeling any better. Please advise. Thanks

## 2019-01-22 NOTE — ED Provider Notes (Addendum)
-----------------------------------------   11:01 PM on 01/22/2019 -----------------------------------------  Patient care assumed from Dr. Archie Balboa.  CTA is negative for PE shows diffuse groundglass opacities.  Patient continues to sat 95 to 96% on room air.  I discussed continued supportive care at home.  Patient has a pulse oximeter at home I discussed with the patient if it maintains 90% or less she should return to the emergency department, for subjective shortness of breath worsens or she develops significant chest pain she should also return.  Otherwise we will continue with supportive care at home.  We will also add prednisone given the patient is nearly 2 weeks into the course of the illness.   Harvest Dark, MD 01/22/19 [id number]    Harvest Dark, MD 01/22/19 [id number]

## 2019-01-22 NOTE — ED Notes (Signed)
Pt has been under airborne and contact precautions since 1900.

## 2019-01-22 NOTE — ED Notes (Signed)
Patient transported to CT 

## 2019-01-23 LAB — PROCALCITONIN: Procalcitonin: <0.1 ng/mL

## 2019-01-25 DIAGNOSIS — R69 Illness, unspecified: Secondary | ICD-10-CM | POA: Diagnosis not present

## 2019-01-25 NOTE — Telephone Encounter (Signed)
Pt called to follow up on her msg she sent yesterday. Please advise? Thank you!

## 2019-01-27 ENCOUNTER — Encounter: Payer: Self-pay | Admitting: Internal Medicine

## 2019-01-27 ENCOUNTER — Ambulatory Visit (INDEPENDENT_AMBULATORY_CARE_PROVIDER_SITE_OTHER): Payer: Medicare HMO | Admitting: Internal Medicine

## 2019-01-27 ENCOUNTER — Other Ambulatory Visit: Payer: Self-pay

## 2019-01-27 DIAGNOSIS — E785 Hyperlipidemia, unspecified: Secondary | ICD-10-CM

## 2019-01-27 DIAGNOSIS — U071 COVID-19: Secondary | ICD-10-CM

## 2019-01-27 DIAGNOSIS — I700 Atherosclerosis of aorta: Secondary | ICD-10-CM

## 2019-01-27 NOTE — Progress Notes (Signed)
Virtual Visit converted to telemetry    This visit type was conducted due to national recommendations for restrictions regarding the COVID-19 pandemic (e.g. social distancing).  This format is felt to be most appropriate for this patient at this time.  All issues noted in this document were discussed and addressed.  No physical exam was performed (except for noted visual exam findings with Video Visits).   I attempted to connect  with@ on 01/27/19 at  9:00 AM EST by a video enabled telemedicine application.  Interactive audio and video telecommunications were initially established beteen this provider and patient, however ultimately failed, due to patient having audio  difficulties. We continued and completed visit by telephone   and verified that I am speaking with the correct person using two identifiers  Location patient: home Location provider: work or home office Persons participating in the virtual visit: patient, provider  I discussed the limitations, risks, security and privacy concerns of performing an evaluation and management service by telephone and the availability of in person appointments. I also discussed with the patient that there may be a patient responsible charge related to this service. The patient expressed understanding and agreed to proceed.   Reason for visit: ER follow up ,  COVID 19 infection   HPI:  73 yr old female , no underlying lung disease  Diagnosed  with COVID 19 INFECTION  On Nov 16 after a known exposure , and  treated in ER  On Nov 27 for shortness of breath.  Serologies to rule out sepsis and PE were done ,  And a CT angiogram was needed due to elevated D Dimer.  She was discharged home but was very unclear about her diagnosis and the results of her tests and is understandably anxious. She is monitoring her room air oxygenation with a consumer grade pulse oximeter and her readings are above  90 with exertion.   She has lost 13 lbs since her illness began  due to loss of appetite .  Her appetite is improving and her weight has been stable for the last 48 hours.  Her cough has improved,  But she remains very fatigued and is dyspneic with daily activities including showering and cook a meal. Her 14 day period of isolation ends on Dec 4,  But she feels she cannot return to work for a full 8 hour day and is requesting a letter to employer supporting a reduced work schedule as tolerated,  Which I have agreed to do.   Tests reviewed in detail with her today ,  Including atherosclerosis noted in aorta and the relevance of this finding.   ROS: Patient denies headache, fevers,  skin rash, eye pain, sinus congestion and sinus pain, sore throat, dysphagia,  hemoptysis ,  wheezing, chest pain, palpitations, orthopnea, edema, abdominal pain, nausea, melena, diarrhea, constipation, flank pain, dysuria, hematuria, urinary  Frequency, nocturia, numbness, tingling, seizures,  Focal weakness, Loss of consciousness,  Tremor, insomnia, depression, anxiety, and suicidal ideation.     Past Medical History:  Diagnosis Date  . Anemia   . Arthritis   . Cataracts, bilateral   . Chicken pox   . Diverticulitis   . Elevated blood pressure reading   . GERD (gastroesophageal reflux disease)   . Lupus ([place])   . SLE (systemic lupus erythematosus) ([place])    diagnosed with skin biopsy and serology  . Thyroid disease     Past Surgical History:  Procedure Laterality Date  . ABDOMINAL HYSTERECTOMY  1987  .  APPENDECTOMY  1952  . BREAST CYST ASPIRATION Right 25 + yrs ago   neg  . CATARACT EXTRACTION, BILATERAL Bilateral 1995, 1996  . [place]   x2 1995 and 1996  . KNEE ARTHROSCOPY W/ ACL RECONSTRUCTION    . STRABISMUS SURGERY Bilateral 09/12/2017   Procedure: BILATERAL STRABISMUS REPAIR;  Surgeon: Everitt Amber, MD;  Location: [facility];  Service: Ophthalmology;  Laterality: Bilateral;  . TONSILLECTOMY AND ADENOIDECTOMY      Family History   Problem Relation Age of Onset  . Aneurysm Mother        AAA  . Other Mother        varicose veins  . Hypertension Father   . Heart disease Father   . Heart attack Father   . Diabetes Sister   . Hyperlipidemia Sister   . Hypertension Sister   . Kidney disease Sister   . Hyperlipidemia Brother   . Hypertension Son   . Hyperlipidemia Son   . Hyperlipidemia Sister   . Breast cancer Neg Hx     SOCIAL HX:  reports that she quit smoking about 32 years ago. She has never used smokeless tobacco. She reports current alcohol use of about 14.0 standard drinks of alcohol per week. She reports that she does not use drugs.   Current Outpatient Medications:  .  acetaminophen (TYLENOL) 500 MG tablet, Take 500 mg by mouth every 6 (six) hours as needed., Disp: , Rfl:  .  ALPRAZolam (XANAX) 0.25 MG tablet, Take by mouth., Disp: , Rfl:  .  b complex vitamins tablet, Take 1 tablet by mouth daily., Disp: , Rfl:  .  Cholecalciferol (VITAMIN D3) 1000 units CAPS, Take by mouth., Disp: , Rfl:  .  diazepam (VALIUM) 5 MG tablet, Take 1 tablet (5 mg total) by mouth every 12 (twelve) hours as needed for anxiety or muscle spasms., Disp: 30 tablet, Rfl: 1 .  Flaxseed, Linseed, (FLAX SEED OIL PO), Take by mouth., Disp: , Rfl:  .  guaiFENesin-codeine (CHERATUSSIN AC) 100-10 MG/5ML syrup, Take 5 mLs by mouth 3 (three) times daily as needed for cough., Disp: 180 mL, Rfl: 0 .  Homeopathic Products (ARNICARE) GEL, Apply topically., Disp: , Rfl:  .  Horse Chestnut 300 MG CAPS, Take 1 capsule by mouth daily., Disp: , Rfl:  .  Multiple Vitamins-Minerals (PRESERVISION AREDS 2+MULTI VIT PO), Take 1 capsule by mouth., Disp: , Rfl:  .  naproxen sodium (ALEVE) 220 MG tablet, Take 220 mg by mouth., Disp: , Rfl:  .  omeprazole (PRILOSEC) 20 MG capsule, Take by mouth., Disp: , Rfl:  .  predniSONE (DELTASONE) 20 MG tablet, Take 2 tablets (40 mg total) by mouth daily., Disp: 10 tablet, Rfl: 0 .  Red Yeast Rice Extract 600 MG  CAPS, Take 1 capsule (600 mg total) by mouth daily. (Patient taking differently: Take 1 capsule by mouth 2 (two) times daily. ), Disp: 60 capsule, Rfl: 0 .  Zinc Sulfate (ZINC 15 PO), Take 1 tablet by mouth daily., Disp: , Rfl:  .  albuterol (VENTOLIN HFA) 108 (90 Base) MCG/ACT inhaler, Inhale 2 puffs into the lungs every 6 (six) hours as needed for wheezing or shortness of breath. (Patient not taking: Reported on 01/27/2019), Disp: 18 g, Rfl: 0  EXAM:  General appearance: alert, cooperative and articulate.  No signs of being in distress  Lungs: not short of breath ,  No cough during interview, speaking in full sentences  Psych: affect normal,dspeech is  articulate and non pressured .  Denies suicidal thoughts   ASSESSMENT AND PLAN:  Discussed the following assessment and plan:  COVID-19 virus infection  Hyperlipidemia with target LDL less than 130  Thoracic aortic atherosclerosis ([place])  COVID-19 virus infection She tested positive and developed acute respiratory failure that did not require supplemental oxygen.  PE was ruled out during ER visit.  Continue isolation until Dec4. Agree with reduced work hours as tolerated.  Note written   Hyperlipidemia with target LDL less than 130 She continues to defer statin therapy despite discussion of risk given incidental finding of oartic atherosclerosis.  She has agreed to increase her RYR to twice daily (1200 mg total)   Lab Results  Component Value Date   CHOL 194 10/29/2018   HDL 48.00 10/29/2018   LDLCALC 124 (H) 10/29/2018   LDLDIRECT 167.8 02/14/2012   TRIG 110.0 10/29/2018   CHOLHDL 4 10/29/2018     Thoracic aortic atherosclerosis ([facility]) Incidental finding on CT angiogram done during ER evaluation for COVID RELATED respiratory failure.  Statin therapy advised but deferred by patient     I discussed the assessment and treatment plan with the patient. The patient was provided an opportunity to ask questions and all were answered.  The patient agreed with the plan and demonstrated an understanding of the instructions.   The patient was advised to call back or seek an in-person evaluation if the symptoms worsen or if the condition fails to improve as anticipated.  , I provided  25 minutes of non-face-to-face time during this encounter reviewing patient's current problems and post surgeries.  Providing counseling on the above mentioned problems , and coordination  of care .  Crecencio Mc, MD

## 2019-01-29 DIAGNOSIS — I700 Atherosclerosis of aorta: Secondary | ICD-10-CM | POA: Insufficient documentation

## 2019-01-29 NOTE — Assessment & Plan Note (Signed)
She tested positive and developed acute respiratory failure that did not require supplemental oxygen.  PE was ruled out during ER visit.  Continue isolation until Dec4. Agree with reduced work hours as tolerated.  Note written

## 2019-01-29 NOTE — Assessment & Plan Note (Signed)
Incidental finding on CT angiogram done during ER evaluation for COVID RELATED respiratory failure.  Statin therapy advised but deferred by patient

## 2019-01-29 NOTE — Assessment & Plan Note (Signed)
She continues to defer statin therapy despite discussion of risk given incidental finding of oartic atherosclerosis.  She has agreed to increase her RYR to twice daily (1200 mg total)   Lab Results  Component Value Date   CHOL 194 10/29/2018   HDL 48.00 10/29/2018   LDLCALC 124 (H) 10/29/2018   LDLDIRECT 167.8 02/14/2012   TRIG 110.0 10/29/2018   CHOLHDL 4 10/29/2018

## 2019-02-04 DIAGNOSIS — Z1211 Encounter for screening for malignant neoplasm of colon: Secondary | ICD-10-CM | POA: Diagnosis not present

## 2019-02-09 LAB — COLOGUARD: Cologuard: NEGATIVE

## 2019-02-24 ENCOUNTER — Telehealth: Payer: Self-pay | Admitting: Internal Medicine

## 2019-02-24 NOTE — Telephone Encounter (Signed)
MyChart message sent :  The results of patient's cologuard is negative. Results abstracted and MyChart message sent

## 2019-04-08 ENCOUNTER — Telehealth: Payer: Self-pay | Admitting: Internal Medicine

## 2019-04-08 NOTE — Telephone Encounter (Signed)
Patient had COVID before Thanksgiving and now she is having night sweets and hot flashes in the night and sometimes in the day time. Patient stated that she is being woken up during the night with these issues and it concerns her.

## 2019-04-08 NOTE — Telephone Encounter (Signed)
Pt is scheduled for a virtual visit on 04/16/2019 to discuss.

## 2019-04-16 ENCOUNTER — Ambulatory Visit: Payer: Medicare HMO | Admitting: Internal Medicine

## 2019-04-17 ENCOUNTER — Ambulatory Visit: Payer: Medicare HMO | Attending: Internal Medicine

## 2019-04-17 DIAGNOSIS — Z23 Encounter for immunization: Secondary | ICD-10-CM

## 2019-04-17 NOTE — Progress Notes (Signed)
   Covid-19 Vaccination Clinic  Name:  TASHAWNA CAPOZZA    MRN: [id number] DOB: [birth year]  04/17/2019  Ms. Puhl was observed post Covid-19 immunization for 15 minutes without incidence. She was provided with Vaccine Information Sheet and instruction to access the V-Safe system.   Ms. Glickstein was instructed to call 911 with any severe reactions post vaccine: Marland Kitchen Difficulty breathing  . Swelling of your face and throat  . A fast heartbeat  . A bad rash all over your body  . Dizziness and weakness    Immunizations Administered    Name Date Dose VIS Date Route   Pfizer COVID-19 Vaccine 04/17/2019  1:05 PM 0.3 mL 02/05/2019 Intramuscular   Manufacturer: [place]   Lot: [id number]   [place]: [id number]

## 2019-04-19 DIAGNOSIS — H52223 Regular astigmatism, bilateral: Secondary | ICD-10-CM | POA: Diagnosis not present

## 2019-04-19 DIAGNOSIS — H5034 Intermittent alternating exotropia: Secondary | ICD-10-CM | POA: Diagnosis not present

## 2019-05-11 ENCOUNTER — Ambulatory Visit: Payer: Medicare HMO | Attending: Internal Medicine

## 2019-05-11 DIAGNOSIS — Z23 Encounter for immunization: Secondary | ICD-10-CM

## 2019-05-11 NOTE — Progress Notes (Signed)
   Covid-19 Vaccination Clinic  Name:  Brenda Leon    MRN: [id number] DOB: 31-Dec-1945  05/11/2019  Brenda Leon was observed post Covid-19 immunization for 15 minutes without incident. She was provided with Vaccine Information Sheet and instruction to access the V-Safe system.   Brenda Leon was instructed to call 911 with any severe reactions post vaccine: Marland Kitchen Difficulty breathing  . Swelling of face and throat  . A fast heartbeat  . A bad rash all over body  . Dizziness and weakness   Immunizations Administered    Name Date Dose VIS Date Route   Pfizer COVID-19 Vaccine 05/11/2019 12:23 PM 0.3 mL 02/05/2019 Intramuscular   Manufacturer: [place]   Lot: [id number]   [place]: [id number]

## 2019-06-03 ENCOUNTER — Other Ambulatory Visit: Payer: Self-pay | Admitting: Internal Medicine

## 2019-06-03 ENCOUNTER — Other Ambulatory Visit: Payer: Self-pay

## 2019-06-03 DIAGNOSIS — Z1231 Encounter for screening mammogram for malignant neoplasm of breast: Secondary | ICD-10-CM

## 2019-06-15 DIAGNOSIS — H04123 Dry eye syndrome of bilateral lacrimal glands: Secondary | ICD-10-CM | POA: Diagnosis not present

## 2019-06-15 DIAGNOSIS — H353131 Nonexudative age-related macular degeneration, bilateral, early dry stage: Secondary | ICD-10-CM | POA: Diagnosis not present

## 2019-06-18 ENCOUNTER — Ambulatory Visit
Admission: RE | Admit: 2019-06-18 | Discharge: 2019-06-18 | Disposition: A | Discharge status: Home / Self Care | Payer: Medicare HMO | Source: Ambulatory Visit | Attending: Internal Medicine | Admitting: Internal Medicine

## 2019-06-18 DIAGNOSIS — Z1231 Encounter for screening mammogram for malignant neoplasm of breast: Secondary | ICD-10-CM | POA: Diagnosis not present

## 2019-07-06 DIAGNOSIS — H04123 Dry eye syndrome of bilateral lacrimal glands: Secondary | ICD-10-CM | POA: Diagnosis not present

## 2019-07-27 DIAGNOSIS — M722 Plantar fascial fibromatosis: Secondary | ICD-10-CM | POA: Diagnosis not present

## 2019-07-27 DIAGNOSIS — M79671 Pain in right foot: Secondary | ICD-10-CM | POA: Diagnosis not present

## 2019-08-03 DIAGNOSIS — H04123 Dry eye syndrome of bilateral lacrimal glands: Secondary | ICD-10-CM | POA: Diagnosis not present

## 2019-09-09 ENCOUNTER — Ambulatory Visit (INDEPENDENT_AMBULATORY_CARE_PROVIDER_SITE_OTHER): Payer: Medicare HMO | Admitting: Psychology

## 2019-09-09 DIAGNOSIS — R69 Illness, unspecified: Secondary | ICD-10-CM | POA: Diagnosis not present

## 2019-09-09 DIAGNOSIS — F4323 Adjustment disorder with mixed anxiety and depressed mood: Secondary | ICD-10-CM | POA: Diagnosis not present

## 2019-09-27 ENCOUNTER — Ambulatory Visit: Payer: Medicare HMO

## 2019-09-28 DIAGNOSIS — Z20822 Contact with and (suspected) exposure to covid-19: Secondary | ICD-10-CM | POA: Diagnosis not present

## 2019-09-30 ENCOUNTER — Ambulatory Visit: Payer: Medicare HMO | Admitting: Psychology

## 2019-10-01 DIAGNOSIS — H04123 Dry eye syndrome of bilateral lacrimal glands: Secondary | ICD-10-CM | POA: Diagnosis not present

## 2019-11-19 ENCOUNTER — Ambulatory Visit (INDEPENDENT_AMBULATORY_CARE_PROVIDER_SITE_OTHER): Payer: Medicare HMO

## 2019-11-19 VITALS — Ht 67.0 in | Wt 195.0 lb

## 2019-11-19 DIAGNOSIS — Z0000 Encounter for general adult medical examination without abnormal findings: Secondary | ICD-10-CM

## 2019-11-19 NOTE — Progress Notes (Addendum)
Subjective:   Brenda Leon is a 74 y.o. female who presents for Medicare Annual (Subsequent) preventive examination.  Review of Systems    No ROS.  Medicare Wellness Virtual Visit.  Visual/audio telehealth visit, UTA vital signs.   See social history for additional risk factors.   Cardiac Risk Factors include: advanced age (>89men, >41 women)     Objective:    Today's Vitals   11/19/19 1404  Weight: 195 lb (88.5 kg)  Height: 5\' 7"  (1.702 m)   Body mass index is 30.54 kg/m.  Advanced Directives 11/19/2019 09/24/2018 09/12/2017 09/04/2017 10/19/2015  Does Patient Have a Medical Advance Directive? No Yes No No No  Type of Advance Directive - Healthcare Power of [place];Living will - - -  Does patient want to make changes to medical advance directive? - No - Patient declined - - -  Copy of [place] in Chart? - No - copy requested - - -  Would patient like information on creating a medical advance directive? Yes (MAU/Ambulatory/Procedural Areas - Information given) - No - Patient declined - No - patient declined information    Current Medications (verified) Outpatient Encounter Medications as of 11/19/2019  Medication Sig  . Omega-3 Krill Oil 500 MG CAPS   . acetaminophen (TYLENOL) 500 MG tablet Take 500 mg by mouth every 6 (six) hours as needed.  Marland Kitchen b complex vitamins tablet Take 1 tablet by mouth daily.  . Cholecalciferol (VITAMIN D3) 1000 units CAPS Take by mouth.  . Flaxseed, Linseed, (FLAX SEED OIL PO) Take by mouth.  . Homeopathic Products ([employer]) GEL Apply topically.  . Horse Chestnut 300 MG CAPS Take 1 capsule by mouth daily.  . Multiple Vitamins-Minerals (PRESERVISION AREDS 2+MULTI VIT PO) Take 1 capsule by mouth.  . naproxen sodium (ALEVE) 220 MG tablet Take 220 mg by mouth.  Marland Kitchen omeprazole (PRILOSEC) 20 MG capsule Take by mouth.  . Red Yeast Rice Extract 600 MG CAPS Take 1 capsule (600 mg total) by mouth daily. (Patient taking differently: Take 1  capsule by mouth 2 (two) times daily. )  . [DISCONTINUED] albuterol (VENTOLIN HFA) 108 (90 Base) MCG/ACT inhaler Inhale 2 puffs into the lungs every 6 (six) hours as needed for wheezing or shortness of breath. (Patient not taking: Reported on 01/27/2019)  . [DISCONTINUED] ALPRAZolam (XANAX) 0.25 MG tablet Take by mouth.  . [DISCONTINUED] diazepam (VALIUM) 5 MG tablet Take 1 tablet (5 mg total) by mouth every 12 (twelve) hours as needed for anxiety or muscle spasms.  . [DISCONTINUED] guaiFENesin-codeine (CHERATUSSIN AC) 100-10 MG/5ML syrup Take 5 mLs by mouth 3 (three) times daily as needed for cough.  . [DISCONTINUED] predniSONE (DELTASONE) 20 MG tablet Take 2 tablets (40 mg total) by mouth daily.  . [DISCONTINUED] Zinc Sulfate (ZINC 15 PO) Take 1 tablet by mouth daily.   No facility-administered encounter medications on file as of 11/19/2019.    Allergies (verified) Patient has no known allergies.   History: Past Medical History:  Diagnosis Date  . Anemia   . Arthritis   . Cataracts, bilateral   . Chicken pox   . Diverticulitis   . Elevated blood pressure reading   . GERD (gastroesophageal reflux disease)   . Lupus ([place])   . SLE (systemic lupus erythematosus) ([place])    diagnosed with skin biopsy and serology  . Thyroid disease    Past Surgical History:  Procedure Laterality Date  . ABDOMINAL HYSTERECTOMY  1987  . APPENDECTOMY  1952  .  BREAST CYST ASPIRATION Right 25 + yrs ago   neg  . CATARACT EXTRACTION, BILATERAL Bilateral 1995, 1996  . [place]   x2 1995 and 1996  . KNEE ARTHROSCOPY W/ ACL RECONSTRUCTION    . STRABISMUS SURGERY Bilateral 09/12/2017   Procedure: BILATERAL STRABISMUS REPAIR;  Surgeon: Everitt Amber, MD;  Location: [facility];  Service: Ophthalmology;  Laterality: Bilateral;  . TONSILLECTOMY AND ADENOIDECTOMY     Family History  Problem Relation Age of Onset  . Aneurysm Mother        AAA  . Other Mother        varicose veins  .  Hypertension Father   . Heart disease Father   . Heart attack Father   . Diabetes Sister   . Hyperlipidemia Sister   . Hypertension Sister   . Kidney disease Sister   . Hyperlipidemia Brother   . Hypertension Son   . Hyperlipidemia Son   . Hyperlipidemia Sister   . Breast cancer Neg Hx    Social History   Socioeconomic History  . Marital status: Married    Spouse name: Not on file  . Number of children: Not on file  . Years of education: Not on file  . Highest education level: Not on file  Occupational History  . Not on file  Tobacco Use  . Smoking status: Former Smoker    Quit date: 02/25/1986    Years since quitting: 33.7  . Smokeless tobacco: Never Used  Vaping Use  . Vaping Use: Never used  Substance and Sexual Activity  . Alcohol use: Yes    Alcohol/week: 14.0 standard drinks    Types: 14 Standard drinks or equivalent per week    Comment: 2 beers daily  . Drug use: No  . Sexual activity: Yes  Other Topics Concern  . Not on file  Social History Narrative  . Not on file   Social Determinants of Health   Financial Resource Strain: Low Risk   . Difficulty of Paying Living Expenses: Not hard at all  Food Insecurity: No Food Insecurity  . Worried About Charity fundraiser in the Last Year: Never true  . Ran Out of Food in the Last Year: Never true  Transportation Needs: No Transportation Needs  . Lack of Transportation (Medical): No  . Lack of Transportation (Non-Medical): No  Physical Activity: Sufficiently Active  . Days of Exercise per Week: 4 days  . Minutes of Exercise per Session: 50 min  Stress: No Stress Concern Present  . Feeling of Stress : Not at all  Social Connections: Unknown  . Frequency of Communication with Friends and Family: Not on file  . Frequency of Social Gatherings with Friends and Family: Not on file  . Attends Religious Services: Not on file  . Active Member of Clubs or Organizations: Not on file  . Attends Archivist  Meetings: Not on file  . Marital Status: Married    Tobacco Counseling Counseling given: Not Answered   Clinical Intake:  Pre-visit preparation completed: Yes        Diabetes: No  How often do you need to have someone help you when you read instructions, pamphlets, or other written materials from your doctor or pharmacy?: 1 - Never Interpreter Needed?: No      Activities of Daily Living In your present state of health, do you have any difficulty performing the following activities: 11/19/2019  Hearing? N  Vision? N  Difficulty  concentrating or making decisions? N  Walking or climbing stairs? N  Dressing or bathing? N  Doing errands, shopping? N  Preparing Food and eating ? N  Using the Toilet? N  In the past six months, have you accidently leaked urine? N  Do you have problems with loss of bowel control? N  Managing your Medications? N  Managing your Finances? N  Housekeeping or managing your Housekeeping? N  Some recent data might be hidden    Patient Care Team: Crecencio Mc, MD as PCP - General (Internal Medicine) Crecencio Mc, MD (Internal Medicine) Bary Castilla Forest Gleason, MD (General Surgery)  Indicate any recent Medical Services you may have received from other than [facility] providers in the past year (date may be approximate).     Assessment:   This is a routine wellness examination for Brenda Leon.  I connected with Brenda Leon today by telephone and verified that I am speaking with the correct person using two identifiers. Location patient: home Location provider: work Persons participating in the virtual visit: patient, Marine scientist.    I discussed the limitations, risks, security and privacy concerns of performing an evaluation and management service by telephone and the availability of in person appointments. The patient expressed understanding and verbally consented to this telephonic visit.    Interactive audio and video telecommunications were attempted  between this provider and patient, however failed, due to patient having technical difficulties OR patient did not have access to video capability.  We continued and completed visit with audio only.  Some vital signs may be absent or patient reported.   Hearing/Vision screen  Hearing Screening   125Hz  250Hz  500Hz  1000Hz  2000Hz  3000Hz  4000Hz  6000Hz  8000Hz   Right ear:           Left ear:           Comments: Patient is able to hear conversational tones without difficulty.  No issues reported.  Vision Screening Comments: Virtual visit  Dietary issues and exercise activities discussed: Current Exercise Habits: Home exercise routine, Type of exercise: yoga;calisthenics, Time (Minutes): 50, Frequency (Times/Week): 2, Weekly Exercise (Minutes/Week): 100, Intensity: Mild  Goals      Patient Stated   .  Healthy diet (pt-stated)      Depression Screen PHQ 2/9 Scores 11/19/2019 01/08/2019 09/24/2018 06/17/2017 10/19/2015 07/02/2013 02/10/2012  PHQ - 2 Score 0 0 0 2 3 1 1   PHQ- 9 Score - - - 7 6 - -    Fall Risk Fall Risk  11/19/2019 01/27/2019 01/08/2019 10/30/2018 09/24/2018  Falls in the past year? 0 0 0 0 0  Number falls in past yr: 0 - 0 - -  Injury with Fall? 0 - - - -  Follow up Falls evaluation completed Falls evaluation completed Falls evaluation completed Falls evaluation completed -   Handrails in use when climbing stairs? Yes Home free of loose throw rugs in walkways, pet beds, electrical cords, etc? Yes  Adequate lighting in your home to reduce risk of falls? Yes   ASSISTIVE DEVICES UTILIZED TO PREVENT FALLS: Use of a cane, walker or w/c? No    TIMED UP AND GO: Was the test performed? No . Virtual visit.   Cognitive Function: Patient is alert and oriented x3.  Denies difficulty focusing, making decisions, making decisions. Currently works as a part Publishing rights manager.  MMSE - Mini Mental State Exam 10/19/2015  Orientation to time 5  Orientation to Place 5  Registration 3    Attention/ Calculation 5  Recall 3  Language- name 2 objects 2  Language- repeat 1  Language- follow 3 step command 3  Language- read & follow direction 1  Write a sentence 1  Copy design 1  Total score 30     6CIT Screen 09/24/2018  What Year? 0 points  What month? 0 points  What time? 0 points  Count back from 20 0 points  Months in reverse 0 points    Immunizations Immunization History  Administered Date(s) Administered  . Fluad Quad(high Dose 65+) 12/18/2018  . Influenza Split 12/11/2011  . Influenza,inj,Quad PF,6+ Mos 02/23/2013, 01/05/2014, 11/03/2014, 10/19/2015  . PFIZER SARS-COV-2 Vaccination 04/17/2019, 05/11/2019  . Pneumococcal Conjugate-13 10/19/2015  . Pneumococcal Polysaccharide-23 02/10/2012  . Tdap 04/12/2010  . Zoster 01/15/2013  . Zoster Recombinat (Shingrix) 03/06/2018    Health Maintenance Health Maintenance  Topic Date Due  . COLONOSCOPY  02/10/2015  . INFLUENZA VACCINE  09/26/2019  . TETANUS/TDAP  04/12/2020  . MAMMOGRAM  06/17/2021  . DEXA SCAN  Completed  . COVID-19 Vaccine  Completed  . Hepatitis C Screening  Completed  . PNA vac Low Risk Adult  Completed   Dental Screening: Recommended annual dental exams for proper oral hygiene.  Community Resource Referral / Chronic Care Management: CRR required this visit?  Yes   CCM required this visit?  No      Plan:  Keep all routine maintenance appointments.   I have personally reviewed and noted the following in the patient's chart:   . Medical and social history . Use of alcohol, tobacco or illicit drugs  . Current medications and supplements . Functional ability and status . Nutritional status . Physical activity . Advanced directives . List of other physicians . Hospitalizations, surgeries, and ER visits in previous 12 months . Vitals . Screenings to include cognitive, depression, and falls . Referrals and appointments  In addition, I have reviewed and discussed with  patient certain preventive protocols, quality metrics, and best practice recommendations. A written personalized care plan for preventive services as well as general preventive health recommendations were provided to patient via mychart.     Leon, Elizah Lydon L, LPN   1/61/0960    I have reviewed the above information and agree with above.   Deborra Medina, MD

## 2019-11-19 NOTE — Patient Instructions (Addendum)
Brenda Leon , Thank you for taking time to come for your Medicare Wellness Visit. I appreciate your ongoing commitment to your health goals. Please review the following plan we discussed and let me know if I can assist you in the future.   These are the goals we discussed: Goals      Patient Stated   .  Healthy diet (pt-stated)       This is a list of the screening recommended for you and due dates:  Health Maintenance  Topic Date Due  . Colon Cancer Screening  02/10/2015  . Flu Shot  09/26/2019  . Tetanus Vaccine  04/12/2020  . Mammogram  06/17/2021  . DEXA scan (bone density measurement)  Completed  . COVID-19 Vaccine  Completed  .  Hepatitis C: One time screening is recommended by Center for Disease Control  (CDC) for  adults born from [birth year] through 1964.   Completed  . Pneumonia vaccines  Completed    Immunizations Immunization History  Administered Date(s) Administered  . Fluad Quad(high Dose 65+) 12/18/2018  . Influenza Split 12/11/2011  . Influenza,inj,Quad PF,6+ Mos 02/23/2013, 01/05/2014, 11/03/2014, 10/19/2015  . PFIZER SARS-COV-2 Vaccination 04/17/2019, 05/11/2019  . Pneumococcal Conjugate-13 10/19/2015  . Pneumococcal Polysaccharide-23 02/10/2012  . Tdap 04/12/2010  . Zoster 01/15/2013  . Zoster Recombinat (Shingrix) 03/06/2018   Advanced directives: mailed to patient per preference  Conditions/risks identified: none new  Follow up in one year for your annual wellness visit    Preventive Care 65 Years and Older, Female Preventive care refers to lifestyle choices and visits with your health care provider that can promote health and wellness. What does preventive care include?  A yearly physical exam. This is also called an annual well check.  Dental exams once or twice a year.  Routine eye exams. Ask your health care provider how often you should have your eyes checked.  Personal lifestyle choices, including:  Daily care of your teeth and  gums.  Regular physical activity.  Eating a healthy diet.  Avoiding tobacco and drug use.  Limiting alcohol use.  Practicing safe sex.  Taking low-dose aspirin every day.  Taking vitamin and mineral supplements as recommended by your health care provider. What happens during an annual well check? The services and screenings done by your health care provider during your annual well check will depend on your age, overall health, lifestyle risk factors, and family history of disease. Counseling  Your health care provider may ask you questions about your:  Alcohol use.  Tobacco use.  Drug use.  Emotional well-being.  Home and relationship well-being.  Sexual activity.  Eating habits.  History of falls.  Memory and ability to understand (cognition).  Work and work Statistician.  Reproductive health. Screening  You may have the following tests or measurements:  Height, weight, and BMI.  Blood pressure.  Lipid and cholesterol levels. These may be checked every 5 years, or more frequently if you are over 24 years old.  Skin check.  Lung cancer screening. You may have this screening every year starting at age 38 if you have a 30-pack-year history of smoking and currently smoke or have quit within the past 15 years.  Fecal occult blood test (FOBT) of the stool. You may have this test every year starting at age 69.  Flexible sigmoidoscopy or colonoscopy. You may have a sigmoidoscopy every 5 years or a colonoscopy every 10 years starting at age 19.  Hepatitis C blood test.  Hepatitis B blood  test.  Sexually transmitted disease (STD) testing.  Diabetes screening. This is done by checking your blood sugar (glucose) after you have not eaten for a while (fasting). You may have this done every 1-3 years.  Bone density scan. This is done to screen for osteoporosis. You may have this done starting at age 72.  Mammogram. This may be done every 1-2 years. Talk to your  health care provider about how often you should have regular mammograms. Talk with your health care provider about your test results, treatment options, and if necessary, the need for more tests. Vaccines  Your health care provider may recommend certain vaccines, such as:  Influenza vaccine. This is recommended every year.  Tetanus, diphtheria, and acellular pertussis (Tdap, Td) vaccine. You may need a Td booster every 10 years.  Zoster vaccine. You may need this after age 31.  Pneumococcal 13-valent conjugate (PCV13) vaccine. One dose is recommended after age 73.  Pneumococcal polysaccharide (PPSV23) vaccine. One dose is recommended after age 67. Talk to your health care provider about which screenings and vaccines you need and how often you need them. This information is not intended to replace advice given to you by your health care provider. Make sure you discuss any questions you have with your health care provider. Document Released: 03/10/2015 Document Revised: 11/01/2015 Document Reviewed: 12/13/2014 Elsevier Interactive Patient Education  2017 [place] Prevention in the Home Falls can cause injuries. They can happen to people of all ages. There are many things you can do to make your home safe and to help prevent falls. What can I do on the outside of my home?  Regularly fix the edges of walkways and driveways and fix any cracks.  Remove anything that might make you trip as you walk through a door, such as a raised step or threshold.  Trim any bushes or trees on the path to your home.  Use bright outdoor lighting.  Clear any walking paths of anything that might make someone trip, such as rocks or tools.  Regularly check to see if handrails are loose or broken. Make sure that both sides of any steps have handrails.  Any raised decks and porches should have guardrails on the edges.  Have any leaves, snow, or ice cleared regularly.  Use sand or salt on walking  paths during winter.  Clean up any spills in your garage right away. This includes oil or grease spills. What can I do in the bathroom?  Use night lights.  Install grab bars by the toilet and in the tub and shower. Do not use towel bars as grab bars.  Use non-skid mats or decals in the tub or shower.  If you need to sit down in the shower, use a plastic, non-slip stool.  Keep the floor dry. Clean up any water that spills on the floor as soon as it happens.  Remove soap buildup in the tub or shower regularly.  Attach bath mats securely with double-sided non-slip rug tape.  Do not have throw rugs and other things on the floor that can make you trip. What can I do in the bedroom?  Use night lights.  Make sure that you have a light by your bed that is easy to reach.  Do not use any sheets or blankets that are too big for your bed. They should not hang down onto the floor.  Have a firm chair that has side arms. You can use this for support while  you get dressed.  Do not have throw rugs and other things on the floor that can make you trip. What can I do in the kitchen?  Clean up any spills right away.  Avoid walking on wet floors.  Keep items that you use a lot in easy-to-reach places.  If you need to reach something above you, use a strong step stool that has a grab bar.  Keep electrical cords out of the way.  Do not use floor polish or wax that makes floors slippery. If you must use wax, use non-skid floor wax.  Do not have throw rugs and other things on the floor that can make you trip. What can I do with my stairs?  Do not leave any items on the stairs.  Make sure that there are handrails on both sides of the stairs and use them. Fix handrails that are broken or loose. Make sure that handrails are as long as the stairways.  Check any carpeting to make sure that it is firmly attached to the stairs. Fix any carpet that is loose or worn.  Avoid having throw rugs at  the top or bottom of the stairs. If you do have throw rugs, attach them to the floor with carpet tape.  Make sure that you have a light switch at the top of the stairs and the bottom of the stairs. If you do not have them, ask someone to add them for you. What else can I do to help prevent falls?  Wear shoes that:  Do not have high heels.  Have rubber bottoms.  Are comfortable and fit you well.  Are closed at the toe. Do not wear sandals.  If you use a stepladder:  Make sure that it is fully opened. Do not climb a closed stepladder.  Make sure that both sides of the stepladder are locked into place.  Ask someone to hold it for you, if possible.  Clearly mark and make sure that you can see:  Any grab bars or handrails.  First and last steps.  Where the edge of each step is.  Use tools that help you move around (mobility aids) if they are needed. These include:  Canes.  Walkers.  Scooters.  Crutches.  Turn on the lights when you go into a dark area. Replace any light bulbs as soon as they burn out.  Set up your furniture so you have a clear path. Avoid moving your furniture around.  If any of your floors are uneven, fix them.  If there are any pets around you, be aware of where they are.  Review your medicines with your doctor. Some medicines can make you feel dizzy. This can increase your chance of falling. Ask your doctor what other things that you can do to help prevent falls. This information is not intended to replace advice given to you by your health care provider. Make sure you discuss any questions you have with your health care provider. Document Released: 12/08/2008 Document Revised: 07/20/2015 Document Reviewed: 03/18/2014 Elsevier Interactive Patient Education  2017 [employer].

## 2019-11-26 DIAGNOSIS — H04123 Dry eye syndrome of bilateral lacrimal glands: Secondary | ICD-10-CM | POA: Diagnosis not present

## 2019-12-06 ENCOUNTER — Ambulatory Visit: Payer: Medicare HMO | Attending: Internal Medicine

## 2019-12-06 DIAGNOSIS — Z23 Encounter for immunization: Secondary | ICD-10-CM

## 2019-12-06 NOTE — Progress Notes (Signed)
   Cov[facility] Vaccination [facility]  Name:  KIDA DIGIULIO    MRN: [id number] DOB: 1945-03-17  12/06/2019  Ms. Clodfelter was observed post Covid-19 immunization for 15 minutes without incident. She was provided with Vaccine Information Sheet and instruction to access the V-Safe system.   Ms. Weilbacher was instructed to call 911 with any severe reactions post vaccine: Marland Kitchen Difficulty breathing  . Swelling of face and throat  . A fast heartbeat  . A bad rash all over body  . Dizziness and weakness

## 2019-12-14 DIAGNOSIS — M19041 Primary osteoarthritis, right hand: Secondary | ICD-10-CM | POA: Insufficient documentation

## 2019-12-14 DIAGNOSIS — M7061 Trochanteric bursitis, right hip: Secondary | ICD-10-CM | POA: Diagnosis not present

## 2019-12-14 DIAGNOSIS — M3219 Other organ or system involvement in systemic lupus erythematosus: Secondary | ICD-10-CM | POA: Diagnosis not present

## 2019-12-14 DIAGNOSIS — M19042 Primary osteoarthritis, left hand: Secondary | ICD-10-CM | POA: Diagnosis not present

## 2019-12-14 DIAGNOSIS — H04123 Dry eye syndrome of bilateral lacrimal glands: Secondary | ICD-10-CM | POA: Diagnosis not present

## 2019-12-14 DIAGNOSIS — M7062 Trochanteric bursitis, left hip: Secondary | ICD-10-CM | POA: Diagnosis not present

## 2019-12-14 DIAGNOSIS — Z79899 Other long term (current) drug therapy: Secondary | ICD-10-CM | POA: Diagnosis not present

## 2020-02-04 DIAGNOSIS — H04123 Dry eye syndrome of bilateral lacrimal glands: Secondary | ICD-10-CM | POA: Diagnosis not present

## 2020-02-10 ENCOUNTER — Telehealth: Payer: Self-pay | Admitting: Internal Medicine

## 2020-02-10 DIAGNOSIS — G8929 Other chronic pain: Secondary | ICD-10-CM

## 2020-02-10 NOTE — Telephone Encounter (Signed)
Spoke with pt and she stated that she has chronic hip pain/bursitis. Pt stated that it has flared back up over the last several weeks in the left hip and was wanting to know if she could get a referral to an orthopedic.

## 2020-02-10 NOTE — Telephone Encounter (Signed)
°  Your referral is in process as requested.  Our referral coordinator will call you when the appointment has been made.  If you do not hear from our office in a week,   Please call us back.  Regards,   Yehudis Monceaux, MD     

## 2020-02-10 NOTE — Telephone Encounter (Signed)
Patient called in stated that she is having pain in her hip bursitis and left knee

## 2020-02-10 NOTE — Telephone Encounter (Signed)
Patient was able to get scheduled with ortho on 03/03/2020

## 2020-05-23 ENCOUNTER — Telehealth: Payer: Self-pay | Admitting: Internal Medicine

## 2020-05-23 DIAGNOSIS — Z1231 Encounter for screening mammogram for malignant neoplasm of breast: Secondary | ICD-10-CM

## 2020-05-23 DIAGNOSIS — N644 Mastodynia: Secondary | ICD-10-CM

## 2020-05-23 NOTE — Telephone Encounter (Signed)
Patient called need a appointment for a mammogram

## 2020-05-24 ENCOUNTER — Telehealth: Payer: Self-pay | Admitting: Internal Medicine

## 2020-05-24 NOTE — Telephone Encounter (Signed)
Pt called [facility] to schedule mammogram appt and was told that she would need a new order for a diagnostic and an [place]. Pt is having left breast pain.

## 2020-05-24 NOTE — Telephone Encounter (Signed)
Patient called in was told to call to get another order for ultra sound and diagnosis because she is having pain

## 2020-05-24 NOTE — Telephone Encounter (Signed)
See previous message

## 2020-05-24 NOTE — Telephone Encounter (Signed)
Mammogram has been ordered and pt is aware that she can call [facility] to schedule.

## 2020-05-25 ENCOUNTER — Telehealth: Payer: Self-pay

## 2020-05-25 DIAGNOSIS — N644 Mastodynia: Secondary | ICD-10-CM

## 2020-05-25 NOTE — Telephone Encounter (Signed)
done

## 2020-05-25 NOTE — Addendum Note (Signed)
Addended by: Crecencio Mc on: 05/25/2020 01:49 PM   Modules accepted: Orders

## 2020-05-25 NOTE — Telephone Encounter (Signed)
-----   Message from Ashley Jacobs sent at 05/25/2020  2:33 PM EDT ----- Regarding: New diag mammo order Good afternoon!  The order for diag mammo is incorrect. Needing IMG [id number]. Thank you!

## 2020-05-25 NOTE — Telephone Encounter (Signed)
Order has been corrected.  

## 2020-05-26 NOTE — Telephone Encounter (Signed)
Thank you :)

## 2020-06-02 ENCOUNTER — Ambulatory Visit
Admission: RE | Admit: 2020-06-02 | Discharge: 2020-06-02 | Disposition: A | Discharge status: Home / Self Care | Payer: Medicare HMO | Source: Ambulatory Visit | Attending: Internal Medicine | Admitting: Internal Medicine

## 2020-06-02 ENCOUNTER — Other Ambulatory Visit: Payer: Self-pay

## 2020-06-02 DIAGNOSIS — R922 Inconclusive mammogram: Secondary | ICD-10-CM | POA: Diagnosis not present

## 2020-06-02 DIAGNOSIS — N644 Mastodynia: Secondary | ICD-10-CM | POA: Insufficient documentation

## 2020-06-16 DIAGNOSIS — H04123 Dry eye syndrome of bilateral lacrimal glands: Secondary | ICD-10-CM | POA: Diagnosis not present

## 2020-06-23 ENCOUNTER — Ambulatory Visit (INDEPENDENT_AMBULATORY_CARE_PROVIDER_SITE_OTHER): Payer: Medicare HMO | Admitting: Internal Medicine

## 2020-06-23 ENCOUNTER — Other Ambulatory Visit: Payer: Self-pay

## 2020-06-23 ENCOUNTER — Encounter: Payer: Self-pay | Admitting: Internal Medicine

## 2020-06-23 VITALS — BP 140/70 | HR 80 | Temp 96.9°F | Resp 15 | Ht 67.0 in | Wt 215.2 lb

## 2020-06-23 DIAGNOSIS — M328 Other forms of systemic lupus erythematosus: Secondary | ICD-10-CM | POA: Diagnosis not present

## 2020-06-23 DIAGNOSIS — R010 Benign and innocent cardiac murmurs: Secondary | ICD-10-CM | POA: Diagnosis not present

## 2020-06-23 DIAGNOSIS — R635 Abnormal weight gain: Secondary | ICD-10-CM

## 2020-06-23 DIAGNOSIS — R600 Localized edema: Secondary | ICD-10-CM | POA: Diagnosis not present

## 2020-06-23 DIAGNOSIS — I83813 Varicose veins of bilateral lower extremities with pain: Secondary | ICD-10-CM

## 2020-06-23 DIAGNOSIS — I700 Atherosclerosis of aorta: Secondary | ICD-10-CM

## 2020-06-23 DIAGNOSIS — Z8616 Personal history of COVID-19: Secondary | ICD-10-CM

## 2020-06-23 MED ORDER — SPIRONOLACTONE 50 MG PO TABS: 50.0000 mg | ORAL_TABLET | Freq: Every day | ORAL | 0 refills | Status: DC

## 2020-06-23 NOTE — Patient Instructions (Signed)
You can use spironolactone over the weekend to lose some fluid.  It is gentler than furosemide  Elevate legs whenever possible during reclining or sitting   Compression stockings ok to use   Vascular referral for ultrasoudn in progress  Return Tuesday 7:45 for fasting labs to rule out nephrotic syndrome,  Hypothyroidism,  Etc and to catch up on maintenance labs including cholesterol   PLEASE DRINK WATER BEFORE LABS SO YOU DON'T LOOK DEHYDRATED

## 2020-06-23 NOTE — Progress Notes (Signed)
Subjective:  Patient ID: Brenda Leon, female    DOB: 1945/03/30  Age: 75 y.o. MRN: [id number]  CC: The primary encounter diagnosis was Functional systolic murmur. Diagnoses of Bilateral lower extremity edema, Weight gain, Varicose veins of both lower extremities with pain, Thoracic aortic atherosclerosis ([place]), Other forms of systemic lupus erythematosus, unspecified organ involvement status ([place]), and Personal history of COVID-19 were also pertinent to this visit.  HPI Brenda Leon presents for evaluation and  treatment of bilateral ankle edema that has been present for several weeks  This visit occurred during the SARS-CoV-2 public health emergency.  Safety protocols were in place, including screening questions prior to the visit, additional usage of staff PPE, and extensive cleaning of exam room while observing appropriate contact time as indicated for disinfecting solutions.  Bt.    Brenda Leon has a history of venous insufficiency and varicose veins,  Used to wear compression stockings but has not worn them in years.  Has noted bilateral ankle and lower leg edema for several weeks,  Worse by the end of the day,  Without orthopnea, exertional dyspnea, but with a weight gain of 20 lbs since Sept 2021.  She denies calf pain.  Diet and medications reviewed. No medication changes.   She has had an increase in sodium intake due to use of convenience food (microwave dinners,  Take out, etc).      Weight gain of 20 lbs since last visit in sept 2021.  She is not exercising.    Outpatient Medications Prior to Visit  Medication Sig Dispense Refill  . acetaminophen (TYLENOL) 500 MG tablet Take 500 mg by mouth every 6 (six) hours as needed.    Marland Kitchen b complex vitamins tablet Take 1 tablet by mouth daily.    . Cholecalciferol (VITAMIN D3) 1000 units CAPS Take 3,000 Units by mouth daily.    . Flaxseed, Linseed, (FLAX SEED OIL PO) Take by mouth.    . Homeopathic Products (ARNICARE) GEL Apply topically.     . Horse Chestnut 300 MG CAPS Take 1 capsule by mouth daily.    . Multiple Vitamins-Minerals (PRESERVISION AREDS 2+MULTI VIT PO) Take 1 capsule by mouth.    . naproxen sodium (ALEVE) 220 MG tablet Take 220 mg by mouth.    . Omega-3 Krill Oil 500 MG CAPS     . omeprazole (PRILOSEC) 20 MG capsule Take by mouth.    . Red Yeast Rice Extract 600 MG CAPS Take 1 capsule (600 mg total) by mouth daily. (Patient taking differently: Take 1 capsule by mouth 2 (two) times daily.) 60 capsule 0  . hydroxychloroquine (PLAQUENIL) 200 MG tablet Take 200 mg by mouth 2 (two) times daily.     No facility-administered medications prior to visit.    Review of Systems;  Patient denies headache, fevers, malaise, unintentional weight loss, skin rash, eye pain, sinus congestion and sinus pain, sore throat, dysphagia,  hemoptysis , cough, dyspnea, wheezing, chest pain, palpitations, orthopnea, edema, abdominal pain, nausea, melena, diarrhea, constipation, flank pain, dysuria, hematuria, urinary  Frequency, nocturia, numbness, tingling, seizures,  Focal weakness, Loss of consciousness,  Tremor, insomnia, depression, anxiety, and suicidal ideation.      Objective:  BP 140/70 (BP Location: Left Arm, Patient Position: Sitting, Cuff Size: Large)   Pulse 80   Temp (!) 96.9 F (36.1 C) (Temporal)   Resp 15   Ht 5\' 7"  (1.702 m)   Wt 215 lb 3.2 oz (97.6 kg)   SpO2 95%  BMI 33.71 kg/m   BP Readings from Last 3 Encounters:  06/23/20 140/70  01/22/19 (!) 175/79  09/12/17 (!) 149/65    Wt Readings from Last 3 Encounters:  06/23/20 215 lb 3.2 oz (97.6 kg)  11/19/19 195 lb (88.5 kg)  01/27/19 195 lb (88.5 kg)    General appearance: alert, cooperative and appears stated age Ears: normal TM's and external ear canals both ears Throat: lips, mucosa, and tongue normal; teeth and gums normal Neck: no adenopathy, no carotid bruit, supple, symmetrical, trachea midline and thyroid not enlarged, symmetric, no  tenderness/mass/nodules Back: symmetric, no curvature. ROM normal. No CVA tenderness. Lungs: clear to auscultation bilaterally Heart: regular rate and rhythm, S1, S2 normal, no murmur, click, rub or gallop Abdomen: soft, non-tender; bowel sounds normal; no masses,  no organomegaly Pulses: 2+ and symmetric Skin: Skin color, texture, turgor normal. No rashes or lesions Ext:  1+ pitting edema to mid tibia bilaterally,  without erythema  Lymph nodes: Cervical, supraclavicular, and axillary nodes normal.  No results found for: HGBA1C  Lab Results  Component Value Date   CREATININE 0.51 01/22/2019   CREATININE 0.66 10/29/2018   CREATININE 0.82 06/03/2017    Lab Results  Component Value Date   WBC 4.0 01/22/2019   HGB 11.0 (L) 01/22/2019   HCT 33.6 (L) 01/22/2019   PLT 199 01/22/2019   GLUCOSE 100 (H) 01/22/2019   CHOL 194 10/29/2018   TRIG 110.0 10/29/2018   HDL 48.00 10/29/2018   LDLDIRECT 167.8 02/14/2012   LDLCALC 124 (H) 10/29/2018   ALT 19 10/29/2018   AST 16 10/29/2018   NA 139 01/22/2019   K 3.2 (L) 01/22/2019   CL 104 01/22/2019   CREATININE 0.51 01/22/2019   BUN 12 01/22/2019   CO2 23 01/22/2019   TSH 2.15 10/29/2018   MICROALBUR <0.7 06/11/2017    MM DIAG BREAST TOMO BILATERAL  Result Date: 06/02/2020 CLINICAL DATA:  75 year old female with intermittent OUTER LEFT breast pain for several months and for annual bilateral mammogram. EXAM: DIGITAL DIAGNOSTIC BILATERAL MAMMOGRAM WITH CAD AND TOMO COMPARISON:  Previous exam(s). ACR Breast Density Category b: There are scattered areas of fibroglandular density. FINDINGS: 2D/3D full field views of both breasts demonstrate no suspicious mass, distortion or worrisome calcifications. Mammographic images were processed with CAD. IMPRESSION: No evidence of breast malignancy. RECOMMENDATION: Bilateral screening mammogram in 1 year. I have discussed the findings, causes and remedies of breast pain and recommendations with the  patient. If applicable, a reminder letter will be sent to the patient regarding the next appointment. BI-RADS CATEGORY  1: Negative. Electronically Signed   By: Margarette Canada M.D.   On: 06/02/2020 14:38    Assessment & Plan:   Problem List Items Addressed This Visit      Unprioritized   Varicose veins of lower extremity with edema, bilateral    She has not been wearing compression stockings but denies pain , only swelling.  Advised to resume use of compression stockings.  Referral to vascular srugery to evaluate veins and rule out lymphedema . Given her history of hypokalemia,  Will prescribe spironolactone 50 mg to use prn       Relevant Medications   spironolactone (ALDACTONE) 50 MG tablet   Thoracic aortic atherosclerosis ([place])    Incidental finding on CT angiogram done during ER evaluation  In Nov 2020 for COVID RELATED respiratory failure.  Statin therapy has been advised but deferred by patient       Relevant Medications  spironolactone (ALDACTONE) 50 MG tablet   SLE (systemic lupus erythematosus) ([place])    Managed with plaquenil by Dr Posey Pronto.  Need to rule out nephrotic syndrome as a cause for worsening edema.        Personal history of COVID-19    She  developed acute respiratory failure in Nov  2020 that did not require supplemental oxygen.  PE was ruled out during ER visit with CT angiogram        Functional systolic murmur - Primary    She deferred cardiac evaluation in 2019 when first noted on exam,  But may now be amenable to an evaluation since her rheumatologist noted her murmur during her recent visit       Relevant Orders   Brain natriuretic peptide    Other Visit Diagnoses    Bilateral lower extremity edema       Relevant Orders   Microalbumin / creatinine urine ratio   Urinalysis, Routine w reflex microscopic   TSH   Brain natriuretic peptide   Comprehensive metabolic panel   Ambulatory referral to Vascular Surgery   Weight gain       Relevant Orders    Hemoglobin A1c   Lipid panel      I am having Brenda Kirschner. Brouillard "Patty" start on spironolactone. I am also having her maintain her (Flaxseed, Linseed, (FLAX SEED OIL PO)), b complex vitamins, Red Yeast Rice Extract, Horse Chestnut, Vitamin D3, Multiple Vitamins-Minerals (PRESERVISION AREDS 2+MULTI VIT PO), Arnicare, omeprazole, acetaminophen, naproxen sodium, Omega-3 Krill Oil, and hydroxychloroquine.  Meds ordered this encounter  Medications  . spironolactone (ALDACTONE) 50 MG tablet    Sig: Take 1 tablet (50 mg total) by mouth daily.    Dispense:  30 tablet    Refill:  0    There are no discontinued medications.  Follow-up: No follow-ups on file.   Crecencio Mc, MD

## 2020-06-24 NOTE — Assessment & Plan Note (Addendum)
She has not been wearing compression stockings but denies pain , only swelling.  Advised to resume use of compression stockings.  Referral to vascular srugery to evaluate veins and rule out lymphedema . Given her history of hypokalemia,  Will prescribe spironolactone 50 mg to use prn

## 2020-06-24 NOTE — Assessment & Plan Note (Addendum)
Incidental finding on CT angiogram done during ER evaluation  In Nov 2020 for COVID RELATED respiratory failure.  Statin therapy has been advised but deferred by patient

## 2020-06-24 NOTE — Assessment & Plan Note (Signed)
Managed with plaquenil by Dr Posey Pronto.  Need to rule out nephrotic syndrome as a cause for worsening edema.

## 2020-06-24 NOTE — Assessment & Plan Note (Addendum)
She  developed acute respiratory failure in Nov  2020 that did not require supplemental oxygen.  PE was ruled out during ER visit with CT angiogram

## 2020-06-24 NOTE — Assessment & Plan Note (Signed)
She deferred cardiac evaluation in 2019 when first noted on exam,  But may now be amenable to an evaluation since her rheumatologist noted her murmur during her recent visit

## 2020-06-27 ENCOUNTER — Other Ambulatory Visit: Payer: Self-pay

## 2020-06-27 ENCOUNTER — Other Ambulatory Visit (INDEPENDENT_AMBULATORY_CARE_PROVIDER_SITE_OTHER): Payer: Medicare HMO

## 2020-06-27 DIAGNOSIS — R010 Benign and innocent cardiac murmurs: Secondary | ICD-10-CM

## 2020-06-27 DIAGNOSIS — R600 Localized edema: Secondary | ICD-10-CM

## 2020-06-27 DIAGNOSIS — R635 Abnormal weight gain: Secondary | ICD-10-CM | POA: Diagnosis not present

## 2020-06-27 LAB — COMPREHENSIVE METABOLIC PANEL
ALT: 17 U/L (ref 0–35)
AST: 15 U/L (ref 0–37)
Albumin: 4.3 g/dL (ref 3.5–5.2)
Alkaline Phosphatase: 52 U/L (ref 39–117)
BUN: 22 mg/dL (ref 6–23)
CO2: 28 mEq/L (ref 19–32)
Calcium: 9.2 mg/dL (ref 8.4–10.5)
Chloride: 102 mEq/L (ref 96–112)
Creatinine, Ser: 0.72 mg/dL (ref 0.40–1.20)
GFR: 82.75 mL/min (ref >60.00)
Glucose, Bld: 95 mg/dL (ref 70–99)
Potassium: 4.4 mEq/L (ref 3.5–5.1)
Sodium: 137 mEq/L (ref 135–145)
Total Bilirubin: 0.4 mg/dL (ref 0.2–1.2)
Total Protein: 6.9 g/dL (ref 6.0–8.3)

## 2020-06-27 LAB — TSH: TSH: 2.55 u[IU]/mL (ref 0.35–4.50)

## 2020-06-27 LAB — URINALYSIS, ROUTINE W REFLEX MICROSCOPIC
Bilirubin Urine: NEGATIVE
Hgb urine dipstick: NEGATIVE
Ketones, ur: NEGATIVE
Nitrite: NEGATIVE
RBC / HPF: NONE SEEN (ref 0–?)
Specific Gravity, Urine: <=1.005 — AB (ref 1.000–1.030)
Total Protein, Urine: NEGATIVE
Urine Glucose: NEGATIVE
Urobilinogen, UA: 0.2 (ref 0.0–1.0)
pH: 7 (ref 5.0–8.0)

## 2020-06-27 LAB — LIPID PANEL
Cholesterol: 207 mg/dL — ABNORMAL HIGH (ref 0–200)
HDL: 48.6 mg/dL (ref >39.00)
LDL Cholesterol: 139 mg/dL — ABNORMAL HIGH (ref 0–99)
NonHDL: 158.87
Total CHOL/HDL Ratio: 4
Triglycerides: 98 mg/dL (ref 0.0–149.0)
VLDL: 19.6 mg/dL (ref 0.0–40.0)

## 2020-06-27 LAB — MICROALBUMIN / CREATININE URINE RATIO
Creatinine,U: 14 mg/dL
Microalb Creat Ratio: 5 mg/g (ref 0.0–30.0)
Microalb, Ur: <0.7 mg/dL (ref 0.0–1.9)

## 2020-06-27 LAB — BRAIN NATRIURETIC PEPTIDE: Pro B Natriuretic peptide (BNP): 32 pg/mL (ref 0.0–100.0)

## 2020-06-27 LAB — HEMOGLOBIN A1C: Hgb A1c MFr Bld: 6 % (ref 4.6–6.5)

## 2020-06-28 ENCOUNTER — Telehealth: Payer: Self-pay | Admitting: Internal Medicine

## 2020-06-28 NOTE — Telephone Encounter (Signed)
PT called in wanting to know what procedure was going to be done at [facility]. That way she can find out if medicare will cover it.

## 2020-06-29 ENCOUNTER — Telehealth: Payer: Self-pay | Admitting: Internal Medicine

## 2020-06-29 NOTE — Telephone Encounter (Signed)
Pt called wanting to know what the name of the [place]  that [facility]  recommended in [place]

## 2020-06-29 NOTE — Telephone Encounter (Signed)
The procedure has not been ordered,  The referral is to the provider for evaluation .  They will likely order a venous ultrasound of each leg

## 2020-06-30 NOTE — Telephone Encounter (Signed)
Spoke with pt and explained to her that Dr. Derrel Nip placed the referral and that the doctor at [facility] will further evaluate her to see what kind of testing/imaging is needed. Pt gave a verbal understanding.

## 2020-06-30 NOTE — Telephone Encounter (Signed)
LMTCB

## 2020-06-30 NOTE — Telephone Encounter (Signed)
Let pt know that the place is [facility] that Dr. Derrel Nip was telling her about.

## 2020-06-30 NOTE — Telephone Encounter (Signed)
Pt returned your call.  

## 2020-07-18 ENCOUNTER — Encounter (INDEPENDENT_AMBULATORY_CARE_PROVIDER_SITE_OTHER): Payer: Self-pay

## 2020-07-19 ENCOUNTER — Other Ambulatory Visit (INDEPENDENT_AMBULATORY_CARE_PROVIDER_SITE_OTHER): Payer: Self-pay | Admitting: Vascular Surgery

## 2020-07-19 DIAGNOSIS — I83813 Varicose veins of bilateral lower extremities with pain: Secondary | ICD-10-CM

## 2020-07-20 ENCOUNTER — Ambulatory Visit (INDEPENDENT_AMBULATORY_CARE_PROVIDER_SITE_OTHER): Payer: Medicare HMO | Admitting: Nurse Practitioner

## 2020-07-20 ENCOUNTER — Other Ambulatory Visit: Payer: Self-pay

## 2020-07-20 ENCOUNTER — Ambulatory Visit (INDEPENDENT_AMBULATORY_CARE_PROVIDER_SITE_OTHER): Payer: Medicare HMO

## 2020-07-20 ENCOUNTER — Encounter (INDEPENDENT_AMBULATORY_CARE_PROVIDER_SITE_OTHER): Payer: Self-pay | Admitting: Nurse Practitioner

## 2020-07-20 VITALS — BP 152/82 | HR 84 | Resp 16 | Wt 212.6 lb

## 2020-07-20 DIAGNOSIS — I83813 Varicose veins of bilateral lower extremities with pain: Secondary | ICD-10-CM

## 2020-07-20 DIAGNOSIS — E785 Hyperlipidemia, unspecified: Secondary | ICD-10-CM

## 2020-07-24 ENCOUNTER — Encounter (INDEPENDENT_AMBULATORY_CARE_PROVIDER_SITE_OTHER): Payer: Self-pay | Admitting: Nurse Practitioner

## 2020-07-24 ENCOUNTER — Encounter (INDEPENDENT_AMBULATORY_CARE_PROVIDER_SITE_OTHER): Payer: Self-pay

## 2020-07-24 NOTE — Progress Notes (Addendum)
Subjective:    Patient ID: Brenda Leon, female    DOB: [birth year], 75 y.o.   MRN: [id number] Chief Complaint  Patient presents with  . New Patient (Initial Visit)    Ref T[facility] vv with pain,ble edema    The patient is seen for evaluation of symptomatic varicose veins. The patient relates burning and stinging which worsened steadily throughout the course of the day, particularly with standing. The patient also notes an aching and throbbing pain over the varicosities, particularly with prolonged dependent positions. The symptoms are significantly improved with elevation.  The patient also notes that during hot weather the symptoms are greatly intensified. The patient states the pain from the varicose veins interferes with work, daily exercise, shopping and household maintenance. At this point, the symptoms are persistent and severe enough that they're having a negative impact on lifestyle and are interfering with daily activities.  There is no history of DVT, PE or superficial thrombophlebitis. There is no history of ulceration or hemorrhage. The patient endorses a family history of varicose veins. OB history: G2P2  The patient has not worn graduated compression in the past. At the present time the patient has not been using over-the-counter analgesics. There is no history of prior surgical intervention or sclerotherapy.  Today the patient has noninvasive studies show reflux in the left common femoral vein as well as in the great saphenous vein at the saphenofemoral junction as well as mid thigh and knee.  No evidence of DVT or superficial thrombophlebitis.    Review of Systems  All other systems reviewed and are negative.      Objective:   Physical Exam Vitals reviewed.  HENT:     Head: Normocephalic.  Cardiovascular:     Rate and Rhythm: Normal rate.     Pulses: Normal pulses.  Pulmonary:     Effort: Pulmonary effort is normal.  Skin:    General: Skin is warm and dry.   Neurological:     Mental Status: She is alert and oriented to person, place, and time.  Psychiatric:        Mood and Affect: Mood normal.        Behavior: Behavior normal.        Thought Content: Thought content normal.        Judgment: Judgment normal.     BP (!) 152/82 (BP Location: Right Arm)   Pulse 84   Resp 16   Wt 212 lb 9.6 oz (96.4 kg)   BMI 33.30 kg/m   Past Medical History:  Diagnosis Date  . Anemia   . Arthritis   . Cataracts, bilateral   . Chicken pox   . Diverticulitis   . Elevated blood pressure reading   . GERD (gastroesophageal reflux disease)   . Lupus ([place])   . SLE (systemic lupus erythematosus) ([place])    diagnosed with skin biopsy and serology  . Thyroid disease     Social History   Socioeconomic History  . Marital status: Married    Spouse name: Not on file  . Number of children: Not on file  . Years of education: Not on file  . Highest education level: Not on file  Occupational History  . Not on file  Tobacco Use  . Smoking status: Former Smoker    Quit date: 02/25/1986    Years since quitting: 34.4  . Smokeless tobacco: Never Used  Vaping Use  . Vaping Use: Never used  Substance and Sexual Activity  .  Alcohol use: Yes    Alcohol/week: 14.0 standard drinks    Types: 14 Standard drinks or equivalent per week    Comment: 2 beers daily  . Drug use: No  . Sexual activity: Yes  Other Topics Concern  . Not on file  Social History Narrative  . Not on file   Social Determinants of Health   Financial Resource Strain: Low Risk   . Difficulty of Paying Living Expenses: Not hard at all  Food Insecurity: No Food Insecurity  . Worried About Charity fundraiser in the Last Year: Never true  . Ran Out of Food in the Last Year: Never true  Transportation Needs: No Transportation Needs  . Lack of Transportation (Medical): No  . Lack of Transportation (Non-Medical): No  Physical Activity: Sufficiently Active  . Days of Exercise per Week: 4  days  . Minutes of Exercise per Session: 50 min  Stress: No Stress Concern Present  . Feeling of Stress : Not at all  Social Connections: Unknown  . Frequency of Communication with Friends and Family: Not on file  . Frequency of Social Gatherings with Friends and Family: Not on file  . Attends Religious Services: Not on file  . Active Member of Clubs or Organizations: Not on file  . Attends Archivist Meetings: Not on file  . Marital Status: Married  Human resources officer Violence: Not At Risk  . Fear of Current or Ex-Partner: No  . Emotionally Abused: No  . Physically Abused: No  . Sexually Abused: No    Past Surgical History:  Procedure Laterality Date  . ABDOMINAL HYSTERECTOMY  1987  . APPENDECTOMY  1952  . BREAST CYST ASPIRATION Right 25 + yrs ago   neg  . CATARACT EXTRACTION, BILATERAL Bilateral 1995, 1996  . [place]   x2 1995 and 1996  . KNEE ARTHROSCOPY W/ ACL RECONSTRUCTION    . STRABISMUS SURGERY Bilateral 09/12/2017   Procedure: BILATERAL STRABISMUS REPAIR;  Surgeon: Everitt Amber, MD;  Location: [facility];  Service: Ophthalmology;  Laterality: Bilateral;  . TONSILLECTOMY AND ADENOIDECTOMY      Family History  Problem Relation Age of Onset  . Aneurysm Mother        AAA  . Other Mother        varicose veins  . Hypertension Father   . Heart disease Father   . Heart attack Father   . Diabetes Sister   . Hyperlipidemia Sister   . Hypertension Sister   . Kidney disease Sister   . Hyperlipidemia Brother   . Hypertension Son   . Hyperlipidemia Son   . Hyperlipidemia Sister   . Breast cancer Neg Hx     No Known Allergies  CBC Latest Ref Rng & Units 01/22/2019 10/29/2018 06/03/2017  WBC 4.0 - 10.5 K/uL 4.0 5.3 6.1  Hemoglobin 12.0 - 15.0 g/dL 11.0(L) 12.4 12.2  Hematocrit 36.0 - 46.0 % 33.6(L) 37.5 36.4  Platelets 150 - 400 K/uL 199 235.0 269.0      CMP     Component Value Date/Time   NA 137 06/27/2020 0745   K 4.4  06/27/2020 0745   CL 102 06/27/2020 0745   CO2 28 06/27/2020 0745   GLUCOSE 95 06/27/2020 0745   BUN 22 06/27/2020 0745   CREATININE 0.72 06/27/2020 0745   CREATININE 0.61 12/20/2011 1118   CALCIUM 9.2 06/27/2020 0745   PROT 6.9 06/27/2020 0745   ALBUMIN 4.3 06/27/2020 0745   AST  15 06/27/2020 0745   ALT 17 06/27/2020 0745   ALKPHOS 52 06/27/2020 0745   BILITOT 0.4 06/27/2020 0745   GFRNONAA >60 01/22/2019 1446   GFRAA >60 01/22/2019 1446     No results found.     Assessment & Plan:   1. Varicose veins of bilateral lower extremities with pain  Recommend:  The patient has large symptomatic varicose veins that are painful and associated with swelling.  The patient is also doing other conservative measures including reducing her salt intake and increasing her fluid intake.   I have had a long discussion with the patient regarding  varicose veins and why they cause symptoms.  Patient will begin wearing graduated compression stockings class 1 on a daily basis, beginning first thing in the morning and removing them in the evening. The patient is instructed specifically not to sleep in the stockings.    The patient  will also begin using over-the-counter analgesics such as Motrin 600 mg po TID to help control the symptoms.    In addition, behavioral modification including elevation during the day will be initiated.    Pending the results of these changes the  patient will be reevaluated in three months.   Further plans will be based on the ultrasound results and whether conservative therapies are successful at eliminating the pain and swelling.   2. Hyperlipidemia with target LDL less than 130 Continue lipid reduction medications as ordered and reviewed, no changes at this time    Current Outpatient Medications on File Prior to Visit  Medication Sig Dispense Refill  . acetaminophen (TYLENOL) 500 MG tablet Take 500 mg by mouth every 6 (six) hours as needed.    Marland Kitchen b complex  vitamins tablet Take 1 tablet by mouth daily.    . Cholecalciferol (VITAMIN D3) 1000 units CAPS Take 3,000 Units by mouth daily.    . Flaxseed, Linseed, (FLAX SEED OIL PO) Take by mouth.    . Homeopathic Products (ARNICARE) GEL Apply topically as needed.    . Horse Chestnut 300 MG CAPS Take 1 capsule by mouth daily.    . hydroxychloroquine (PLAQUENIL) 200 MG tablet Take 200 mg by mouth 2 (two) times daily.    . Multiple Vitamins-Minerals (PRESERVISION AREDS 2+MULTI VIT PO) Take 1 capsule by mouth.    . naproxen sodium (ALEVE) 220 MG tablet Take 220 mg by mouth.    . Omega-3 Krill Oil 500 MG CAPS     . omeprazole (PRILOSEC) 20 MG capsule Take by mouth.    . Red Yeast Rice Extract 600 MG CAPS Take 1 capsule (600 mg total) by mouth daily. (Patient taking differently: Take 1 capsule by mouth 2 (two) times daily.) 60 capsule 0  . spironolactone (ALDACTONE) 50 MG tablet Take 1 tablet (50 mg total) by mouth daily. (Patient not taking: Reported on 07/20/2020) 30 tablet 0   No current facility-administered medications on file prior to visit.    There are no Patient Instructions on file for this visit. No follow-ups on file.   Kris Hartmann, NP

## 2020-08-14 ENCOUNTER — Telehealth: Payer: Self-pay | Admitting: Internal Medicine

## 2020-08-14 NOTE — Telephone Encounter (Signed)
Transfer to Access Nurse    PT called to advise that on Saturday she fell and hurt her hip and now whenever she walks, raises her right leg or do any twisting it causes sharp pain. She wanted Dr.Tullo to see if she needed a xray. Transferred to Access Nurse to advise no apts.PT hung up before transferred but Access Nurse advise they would reach out to triage.

## 2020-08-14 NOTE — Telephone Encounter (Signed)
Patient informed and verbalized understanding.  Given information for [employer]

## 2020-08-14 NOTE — Telephone Encounter (Signed)
For your information  

## 2020-08-14 NOTE — Telephone Encounter (Signed)
Providing access nurse documentation.      

## 2020-08-15 DIAGNOSIS — M5416 Radiculopathy, lumbar region: Secondary | ICD-10-CM | POA: Diagnosis not present

## 2020-08-16 DIAGNOSIS — M9901 Segmental and somatic dysfunction of cervical region: Secondary | ICD-10-CM | POA: Diagnosis not present

## 2020-08-16 DIAGNOSIS — M5137 Other intervertebral disc degeneration, lumbosacral region: Secondary | ICD-10-CM | POA: Diagnosis not present

## 2020-08-16 DIAGNOSIS — M5459 Other low back pain: Secondary | ICD-10-CM | POA: Diagnosis not present

## 2020-08-16 DIAGNOSIS — M9902 Segmental and somatic dysfunction of thoracic region: Secondary | ICD-10-CM | POA: Diagnosis not present

## 2020-08-16 DIAGNOSIS — M9903 Segmental and somatic dysfunction of lumbar region: Secondary | ICD-10-CM | POA: Diagnosis not present

## 2020-08-16 DIAGNOSIS — M5417 Radiculopathy, lumbosacral region: Secondary | ICD-10-CM | POA: Diagnosis not present

## 2020-08-18 DIAGNOSIS — M5459 Other low back pain: Secondary | ICD-10-CM | POA: Diagnosis not present

## 2020-08-18 DIAGNOSIS — M9903 Segmental and somatic dysfunction of lumbar region: Secondary | ICD-10-CM | POA: Diagnosis not present

## 2020-08-18 DIAGNOSIS — M9902 Segmental and somatic dysfunction of thoracic region: Secondary | ICD-10-CM | POA: Diagnosis not present

## 2020-08-18 DIAGNOSIS — M5137 Other intervertebral disc degeneration, lumbosacral region: Secondary | ICD-10-CM | POA: Diagnosis not present

## 2020-08-18 DIAGNOSIS — M5417 Radiculopathy, lumbosacral region: Secondary | ICD-10-CM | POA: Diagnosis not present

## 2020-08-18 DIAGNOSIS — M9901 Segmental and somatic dysfunction of cervical region: Secondary | ICD-10-CM | POA: Diagnosis not present

## 2020-08-23 DIAGNOSIS — M5459 Other low back pain: Secondary | ICD-10-CM | POA: Diagnosis not present

## 2020-08-23 DIAGNOSIS — M9901 Segmental and somatic dysfunction of cervical region: Secondary | ICD-10-CM | POA: Diagnosis not present

## 2020-08-23 DIAGNOSIS — M5417 Radiculopathy, lumbosacral region: Secondary | ICD-10-CM | POA: Diagnosis not present

## 2020-08-23 DIAGNOSIS — M9903 Segmental and somatic dysfunction of lumbar region: Secondary | ICD-10-CM | POA: Diagnosis not present

## 2020-08-23 DIAGNOSIS — M5137 Other intervertebral disc degeneration, lumbosacral region: Secondary | ICD-10-CM | POA: Diagnosis not present

## 2020-08-23 DIAGNOSIS — M9902 Segmental and somatic dysfunction of thoracic region: Secondary | ICD-10-CM | POA: Diagnosis not present

## 2020-08-29 DIAGNOSIS — M19042 Primary osteoarthritis, left hand: Secondary | ICD-10-CM | POA: Diagnosis not present

## 2020-08-29 DIAGNOSIS — M19041 Primary osteoarthritis, right hand: Secondary | ICD-10-CM | POA: Diagnosis not present

## 2020-08-29 DIAGNOSIS — Z79899 Other long term (current) drug therapy: Secondary | ICD-10-CM | POA: Diagnosis not present

## 2020-08-29 DIAGNOSIS — M3219 Other organ or system involvement in systemic lupus erythematosus: Secondary | ICD-10-CM | POA: Diagnosis not present

## 2020-10-19 ENCOUNTER — Ambulatory Visit (INDEPENDENT_AMBULATORY_CARE_PROVIDER_SITE_OTHER): Payer: Medicare HMO | Admitting: Nurse Practitioner

## 2020-10-19 ENCOUNTER — Encounter (INDEPENDENT_AMBULATORY_CARE_PROVIDER_SITE_OTHER): Payer: Self-pay

## 2020-11-21 ENCOUNTER — Ambulatory Visit: Payer: Medicare HMO

## 2020-12-28 DIAGNOSIS — Z79899 Other long term (current) drug therapy: Secondary | ICD-10-CM | POA: Diagnosis not present

## 2020-12-28 DIAGNOSIS — H04123 Dry eye syndrome of bilateral lacrimal glands: Secondary | ICD-10-CM | POA: Diagnosis not present

## 2021-01-16 ENCOUNTER — Ambulatory Visit: Payer: Medicare HMO

## 2021-01-20 ENCOUNTER — Encounter: Payer: Self-pay | Admitting: Vascular Surgery

## 2021-01-25 ENCOUNTER — Encounter: Payer: Medicare HMO | Admitting: Vascular Surgery

## 2021-02-14 ENCOUNTER — Other Ambulatory Visit: Payer: Self-pay

## 2021-02-14 ENCOUNTER — Encounter: Payer: Self-pay | Admitting: Vascular Surgery

## 2021-02-14 ENCOUNTER — Ambulatory Visit: Payer: Medicare HMO | Admitting: Vascular Surgery

## 2021-02-14 VITALS — BP 162/76 | HR 82 | Temp 98.1°F | Resp 18 | Ht 67.5 in | Wt 213.3 lb

## 2021-02-14 DIAGNOSIS — I872 Venous insufficiency (chronic) (peripheral): Secondary | ICD-10-CM

## 2021-02-14 DIAGNOSIS — M7989 Other specified soft tissue disorders: Secondary | ICD-10-CM

## 2021-02-14 NOTE — Progress Notes (Signed)
ASSESSMENT & PLAN   CHRONIC VENOUS INSUFFICIENCY: This patient has CEAP C1 venous disease (telangiectasias).  She has no significant superficial or deep venous reflux on the right.  On the left side she does have some deep venous reflux and some superficial venous reflux.  We have discussed the importance of intermittent leg elevation and the proper positioning for this.  We are also fitting her for some knee-high compression stockings with a gradient of 15 to 20 mmHg.  We have discussed importance of exercise specifically walking and water aerobics.  I have encouraged her to avoid prolonged sitting and standing.  We discussed the importance of maintaining a healthy weight.  I also encouraged her to keep her skin well lubricated in the winter.  If her symptoms progress then I think we could try her in some thigh-high compression stockings with a gradient of 20 to 30 mmHg.  If she continues to have symptoms after that we probably have to reevaluate her reflux study on the left as she may be a candidate for laser ablation of the left great saphenous vein in the future.  I will see her back as needed.  REASON FOR CONSULT:    Pain and swelling left lower extremity.  This is a second opinion.  HPI:   Brenda Leon is a 75 y.o. female who presents for a second opinion concerning the venous disease of her left leg.  I reviewed the records from [facility].  The patient was seen on 07/20/2020.  She had symptomatic varicose veins that were more significant on the left side.  She was prescribed compression stockings.  She was to be reevaluated in 3 months.   On my history, the patient describes significant aching pain and heaviness especially in the left leg.  Her symptoms are aggravated by sitting and standing and improves somewhat with ambulation.  Her symptoms are worse at the end of the day.  She has no previous history of DVT.  She is had no previous venous procedures.  I do not  get any history of claudication or rest pain.  Past Medical History:  Diagnosis Date   Anemia    Arthritis    Cataracts, bilateral    Chicken pox    Diverticulitis    Elevated blood pressure reading    GERD (gastroesophageal reflux disease)    Lupus (HCC)    SLE (systemic lupus erythematosus) (HCC)    diagnosed with skin biopsy and serology   Thyroid disease     Family History  Problem Relation Age of Onset   Aneurysm Mother        AAA   Other Mother        varicose veins   Hypertension Father    Heart disease Father    Heart attack Father    Diabetes Sister    Hyperlipidemia Sister    Hypertension Sister    Kidney disease Sister    Hyperlipidemia Brother    Hypertension Son    Hyperlipidemia Son    Hyperlipidemia Sister    Breast cancer Neg Hx     SOCIAL HISTORY: Social History   Tobacco Use   Smoking status: Former    Types: Cigarettes    Quit date: 02/25/1986    Years since quitting: 34.9   Smokeless tobacco: Never  Substance Use Topics   Alcohol use: Yes    Alcohol/week: 14.0 standard drinks    Types: 14 Standard drinks or equivalent per week  Comment: 2 beers daily    No Known Allergies  Current Outpatient Medications  Medication Sig Dispense Refill   acetaminophen (TYLENOL) 500 MG tablet Take 650 mg by mouth every 6 (six) hours as needed.     b complex vitamins tablet Take 1 tablet by mouth daily.     Cholecalciferol (VITAMIN D3) 1000 units CAPS Take 2,000 Units by mouth daily. Take 4,000 units daily     Flaxseed, Linseed, (FLAX SEED OIL PO) Take by mouth.     Horse Chestnut 300 MG CAPS Take 1 capsule by mouth daily.     hydroxychloroquine (PLAQUENIL) 200 MG tablet Take 200 mg by mouth 2 (two) times daily.     naproxen sodium (ALEVE) 220 MG tablet Take 220 mg by mouth.     Omega-3 Krill Oil 500 MG CAPS      omeprazole (PRILOSEC) 20 MG capsule Take by mouth.     Red Yeast Rice Extract 600 MG CAPS Take 1 capsule (600 mg total) by mouth daily.  (Patient taking differently: Take 1 capsule by mouth 2 (two) times daily.) 60 capsule 0   Homeopathic Products (ARNICARE) GEL Apply topically as needed. (Patient not taking: Reported on 02/14/2021)     Multiple Vitamins-Minerals (PRESERVISION AREDS 2+MULTI VIT PO) Take 1 capsule by mouth. (Patient not taking: Reported on 02/14/2021)     spironolactone (ALDACTONE) 50 MG tablet Take 1 tablet (50 mg total) by mouth daily. (Patient not taking: Reported on 07/20/2020) 30 tablet 0   No current facility-administered medications for this visit.    REVIEW OF SYSTEMS:  [X]  denotes positive finding, [ ]  denotes negative finding Cardiac  Comments:  Chest pain or chest pressure:    Shortness of breath upon exertion:    Short of breath when lying flat:    Irregular heart rhythm:        Vascular    Pain in calf, thigh, or hip brought on by ambulation:    Pain in feet at night that wakes you up from your sleep:     Blood clot in your veins:    Leg swelling:         Pulmonary    Oxygen at home:    Productive cough:     Wheezing:         Neurologic    Sudden weakness in arms or legs:     Sudden numbness in arms or legs:     Sudden onset of difficulty speaking or slurred speech:    Temporary loss of vision in one eye:     Problems with dizziness:         Gastrointestinal    Blood in stool:     Vomited blood:         Genitourinary    Burning when urinating:     Blood in urine:        Psychiatric    Major depression:         Hematologic    Bleeding problems:    Problems with blood clotting too easily:        Skin    Rashes or ulcers:        Constitutional    Fever or chills:    -  PHYSICAL EXAM:   Vitals:   02/14/21 1334  BP: (!) 162/76  Pulse: 82  Resp: 18  Temp: 98.1 F (36.7 C)  TempSrc: Temporal  SpO2: 95%  Weight: 213 lb 4.8 oz (96.8 kg)  Height: 5' 7.5" (  1.715 m)   Body mass index is 32.91 kg/m. GENERAL: The patient is a well-nourished female, in no acute  distress. The vital signs are documented above. CARDIAC: There is a regular rate and rhythm.  VASCULAR: I do not detect carotid bruits. She has palpable pedal pulses. She has a biphasic dorsalis pedis and posterior tibial signal bilaterally. She has no significant lower extremity swelling.  She She has no hyperpigmentation. I did look at her left great saphenous vein myself with the SonoSite.  It is superficial in the distal thigh.  The vein is marginally dilated. PULMONARY: There is good air exchange bilaterally without wheezing or rales. ABDOMEN: Soft and non-tender with normal pitched bowel sounds.  MUSCULOSKELETAL: There are no major deformities. NEUROLOGIC: No focal weakness or paresthesias are detected. SKIN: There are no ulcers or rashes noted. PSYCHIATRIC: The patient has a normal affect.  DATA:    VENOUS DUPLEX: I have reviewed the venous duplex scan that was done on 07/20/2020 elsewhere.  On the left side, which is the symptomatic side, there was no evidence of DVT.  There was deep venous reflux in the common femoral vein.  There was some superficial venous reflux in the left great saphenous vein at the mid thigh and knee.  Diameters there were 4.2 and 3.9 mm respectively.    On the right side, there was no evidence of DVT.  There was no deep venous reflux.  There was no superficial venous reflux.  Deitra Mayo [facility]

## 2021-02-23 ENCOUNTER — Telehealth: Payer: Self-pay

## 2021-02-23 NOTE — Telephone Encounter (Signed)
LMTCB

## 2021-02-23 NOTE — Telephone Encounter (Signed)
Pt sent this message in an appt request instead of through a mychart.   "Relationship issues with Brenda Leon.  We cannot afford a therapist, perhaps you can recommend a support group?"  Would you prefer pt to schedule an appt with you first?

## 2021-02-23 NOTE — Telephone Encounter (Signed)
Pt has been scheduled for an appt on Wednesday. Pt is aware of appt date and time.

## 2021-02-25 ENCOUNTER — Encounter: Payer: Self-pay | Admitting: Internal Medicine

## 2021-02-28 ENCOUNTER — Other Ambulatory Visit: Payer: Self-pay

## 2021-02-28 ENCOUNTER — Ambulatory Visit (INDEPENDENT_AMBULATORY_CARE_PROVIDER_SITE_OTHER): Payer: Medicare HMO | Admitting: Internal Medicine

## 2021-02-28 ENCOUNTER — Encounter: Payer: Self-pay | Admitting: Internal Medicine

## 2021-02-28 VITALS — BP 140/88 | HR 85 | Temp 98.2°F | Ht 67.5 in | Wt 213.0 lb

## 2021-02-28 DIAGNOSIS — Z630 Problems in relationship with spouse or partner: Secondary | ICD-10-CM | POA: Insufficient documentation

## 2021-02-28 DIAGNOSIS — Z23 Encounter for immunization: Secondary | ICD-10-CM | POA: Diagnosis not present

## 2021-02-28 DIAGNOSIS — Z7189 Other specified counseling: Secondary | ICD-10-CM

## 2021-02-28 DIAGNOSIS — R69 Illness, unspecified: Secondary | ICD-10-CM | POA: Diagnosis not present

## 2021-02-28 MED ORDER — ALPRAZOLAM 0.25 MG PO TABS: 0.2500 mg | ORAL_TABLET | Freq: Every evening | ORAL | 5 refills | Status: DC | PRN

## 2021-02-28 MED ORDER — DULOXETINE HCL 30 MG PO CPEP: 30.0000 mg | ORAL_CAPSULE | Freq: Every day | ORAL | 3 refills | Status: DC

## 2021-02-28 NOTE — Patient Instructions (Addendum)
° °  I have refilled the alprazolam to help your insomnia .   I recommend a trial of generic cymbalta to help mange your chronic pain, depression and insomnia.   Take it after dinner on a full stomach.   I am letting you know that I am referring to our chronic care management team for the purpose of putting you in touch with our social worker for help in finding support groups .  Dr Nicolasa Ducking has recommended [facility] at [facility] for couples counselling,  should that option become  available for you.

## 2021-02-28 NOTE — Assessment & Plan Note (Addendum)
She loves her husband but has no intimacy or companionship in her current marriage (60rd).  counselling given .referral to couples marriage counsellors
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
## 2021-03-01 ENCOUNTER — Telehealth: Payer: Self-pay

## 2021-03-01 NOTE — Chronic Care Management (AMB) (Signed)
°  Chronic Care Management   Outreach Note  03/01/2021 Name: Brenda Leon MRN: [id number] DOB: [birth year]  Brenda Leon is a 76 y.o. year old female who is a primary care patient of Derrel Nip, Aris Everts, MD. I reached out to [name] by phone today in response to a referral sent by [name]'s primary care provider.  An unsuccessful telephone outreach was attempted today. The patient was referred to the case management team for assistance with care management and care coordination.   Follow Up Plan: A HIPAA compliant phone message was left for the patient providing contact information and requesting a return call.  The care management team will reach out to the patient again over the next 7 days.  If patient returns call to provider office, please advise to call [facility] [place], [place], [place] [facility]  [place], [place] [address] Direct Dial: [phone] [email or web address]

## 2021-03-05 NOTE — Chronic Care Management (AMB) (Signed)
Chronic Care Management   Note  03/05/2021 Name: LILIAS LORENSEN MRN: [id number] DOB: Aug 29, 1945  KAELI NICHELSON is a 76 y.o. year old female who is a primary care patient of Derrel Nip, Aris Everts, MD. I reached out to [name] by phone today in response to a referral sent by Ms. Elmon Kirschner Colon's PCP.  Ms. Kroboth was given information about Chronic Care Management services today including:  CCM service includes personalized support from designated clinical staff supervised by her physician, including individualized plan of care and coordination with other care providers 24/7 contact phone numbers for assistance for urgent and routine care needs. Service will only be billed when office clinical staff spend 20 minutes or more in a month to coordinate care. Only one practitioner may furnish and bill the service in a calendar month. The patient may stop CCM services at any time (effective at the end of the month) by phone call to the office staff. The patient is responsible for co-pay (up to 20% after annual deductible is met) if co-pay is required by the individual health plan.   Patient agreed to services and verbal consent obtained.   Follow up plan: Telephone appointment with care management team member scheduled for:03/27/2021  Noreene Larsson, [place], [place],  [address] Direct Dial: [phone] [email or web address]

## 2021-03-06 ENCOUNTER — Telehealth: Payer: Medicare HMO | Admitting: *Deleted

## 2021-03-06 ENCOUNTER — Telehealth: Payer: Self-pay | Admitting: *Deleted

## 2021-03-06 NOTE — Telephone Encounter (Signed)
°  Care Management   Follow Up Note   03/06/2021 Name: Brenda Leon MRN: [id number] DOB: 06/05/1945   Referred by: Crecencio Mc, MD  Reason for referral : Chronic Care Management Needs in Patient with Marital Conflict, Systemic Lupus Erythematosus, Insomnia Secondary to Anxiety, Generalized Anxiety Disorder, Thoracic Aortic Atherosclerosis.  An unsuccessful telephone outreach was attempted today. The patient was referred to the case management team for assistance with care management and care coordination. A HIPAA compliant message was left on voicemail, including contact information, encouraging patient to return LCSW's call at her earliest convenience.  LCSW will make a second initial telephone outreach call within the next three weeks, if a return call is not received from patient in the meantime.  Follow-Up Plan:  03/27/2021 at 12:45 pm  Nat Christen LCSW Licensed Clinical Social Worker [facility]  [phone]

## 2021-03-08 ENCOUNTER — Ambulatory Visit (INDEPENDENT_AMBULATORY_CARE_PROVIDER_SITE_OTHER): Payer: Medicare HMO

## 2021-03-08 VITALS — Ht 67.5 in | Wt 213.0 lb

## 2021-03-08 DIAGNOSIS — Z0000 Encounter for general adult medical examination without abnormal findings: Secondary | ICD-10-CM

## 2021-03-08 NOTE — Progress Notes (Signed)
Subjective:   Brenda Leon is a 76 y.o. female who presents for Medicare Annual (Subsequent) preventive examination.  Review of Systems    No ROS.  Medicare Wellness Virtual Visit.  Visual/audio telehealth visit, UTA vital signs.   See social history for additional risk factors.   Cardiac Risk Factors include: advanced age (>32men, >7 women)     Objective:    Today's Vitals   03/08/21 1541  Weight: 213 lb (96.6 kg)  Height: 5' 7.5" (1.715 m)   Body mass index is 32.87 kg/m.  Advanced Directives 03/08/2021 11/19/2019 09/24/2018 09/12/2017 09/04/2017 10/19/2015  Does Patient Have a Medical Advance Directive? No No Yes No No No  Type of Advance Directive - Public librarian;Living will - - -  Does patient want to make changes to medical advance directive? - - No - Patient declined - - -  Copy of [place] in Chart? - - No - copy requested - - -  Would patient like information on creating a medical advance directive? No - Patient declined Yes (MAU/Ambulatory/Procedural Areas - Information given) - No - Patient declined - No - patient declined information    Current Medications (verified) Outpatient Encounter Medications as of 03/08/2021  Medication Sig   acetaminophen (TYLENOL) 500 MG tablet Take 650 mg by mouth every 6 (six) hours as needed.   ALPRAZolam (XANAX) 0.25 MG tablet Take 1 tablet (0.25 mg total) by mouth at bedtime as needed for anxiety.   b complex vitamins tablet Take 1 tablet by mouth daily.   Cholecalciferol (VITAMIN D3) 1000 units CAPS Take 2,000 Units by mouth daily. Take 4,000 units daily   DULoxetine (CYMBALTA) 30 MG capsule Take 1 capsule (30 mg total) by mouth daily.   Flaxseed, Linseed, (FLAX SEED OIL PO) Take by mouth.   [place] 300 MG CAPS Take 1 capsule by mouth daily.   hydroxychloroquine (PLAQUENIL) 200 MG tablet Take 200 mg by mouth 2 (two) times daily.   MAGNESIUM GLUCONATE PO Take 1 tablet by mouth.   Multiple  Vitamins-Minerals (PRESERVISION AREDS 2+MULTI VIT PO) Take 1 capsule by mouth.   naproxen sodium (ALEVE) 220 MG tablet Take 220 mg by mouth.   omeprazole (PRILOSEC) 20 MG capsule Take by mouth.   Red Yeast Rice Extract 600 MG CAPS Take 1 capsule (600 mg total) by mouth daily. (Patient taking differently: Take 1 capsule by mouth 2 (two) times daily.)   No facility-administered encounter medications on file as of 03/08/2021.    Allergies (verified) Patient has no known allergies.   History: Past Medical History:  Diagnosis Date   Anemia    Arthritis    Cataracts, bilateral    Chicken pox    Diverticulitis    Elevated blood pressure reading    GERD (gastroesophageal reflux disease)    Lupus (HCC)    SLE (systemic lupus erythematosus) (HCC)    diagnosed with skin biopsy and serology   Thyroid disease    Past Surgical History:  Procedure Laterality Date   ABDOMINAL HYSTERECTOMY  1987   APPENDECTOMY  1952   BREAST CYST ASPIRATION Right 25 + yrs ago   neg   CATARACT EXTRACTION, BILATERAL Bilateral 1995, [place]   x2 1995 and [place] ARTHROSCOPY W/ ACL RECONSTRUCTION     STRABISMUS SURGERY Bilateral 09/12/2017   Procedure: BILATERAL STRABISMUS REPAIR;  Surgeon: Everitt Amber, MD;  Location: [facility];  Service:  Ophthalmology;  Laterality: Bilateral;   TONSILLECTOMY AND ADENOIDECTOMY     Family History  Problem Relation Age of Onset   Aneurysm Mother        AAA   Other Mother        varicose veins   Hypertension Father    Heart disease Father    Heart attack Father    Diabetes Sister    Hyperlipidemia Sister    Hypertension Sister    Kidney disease Sister    Hyperlipidemia Brother    Hypertension Son    Hyperlipidemia Son    Hyperlipidemia Sister    Breast cancer Neg Hx    Social History   Socioeconomic History   Marital status: Married    Spouse name: Not on file   Number of children: Not on file   Years of education: Not on  file   Highest education level: Not on file  Occupational History   Not on file  Tobacco Use   Smoking status: Former    Types: Cigarettes    Quit date: 02/25/1986    Years since quitting: 35.0   Smokeless tobacco: Never  Vaping Use   Vaping Use: Never used  Substance and Sexual Activity   Alcohol use: Yes    Alcohol/week: 14.0 standard drinks    Types: 14 Standard drinks or equivalent per week    Comment: 2 beers daily   Drug use: No   Sexual activity: Yes  Other Topics Concern   Not on file  Social History Narrative   Not on file   Social Determinants of Health   Financial Resource Strain: Low Risk    Difficulty of Paying Living Expenses: Not hard at all  Food Insecurity: No Food Insecurity   Worried About Charity fundraiser in the Last Year: Never true   [place] in the Last Year: Never true  Transportation Needs: No Transportation Needs   Lack of Transportation (Medical): No   Lack of Transportation (Non-Medical): No  Physical Activity: Not on file  Stress: No Stress Concern Present   Feeling of Stress : Not at all  Social Connections: Unknown   Frequency of Communication with Friends and Family: Not on file   Frequency of Social Gatherings with Friends and Family: Not on file   Attends Religious Services: Not on file   Active Member of Clubs or Organizations: Not on file   Attends Archivist Meetings: Not on file   Marital Status: Married    Tobacco Counseling Counseling given: Not Answered   Clinical Intake:  Pre-visit preparation completed: Yes        Diabetes: No  How often do you need to have someone help you when you read instructions, pamphlets, or other written materials from your doctor or pharmacy?: 1 - Never    Interpreter Needed?: No      Activities of Daily Living In your present state of health, do you have any difficulty performing the following activities: 03/08/2021  Hearing? N  Vision? N  Difficulty  concentrating or making decisions? N  Walking or climbing stairs? N  Dressing or bathing? N  Doing errands, shopping? N  Preparing Food and eating ? N  Using the Toilet? N  In the past six months, have you accidently leaked urine? N  Do you have problems with loss of bowel control? N  Managing your Medications? N  Managing your Finances? N  Housekeeping or managing your Housekeeping? N  Some recent  data might be hidden    Patient Care Team: Crecencio Mc, MD as PCP - General (Internal Medicine) Crecencio Mc, MD (Internal Medicine) Bary Castilla [place] Gleason, MD (General Surgery) [place], Maree Erie, LCSW as Social Worker (Licensed Clinical Social Worker)  Indicate any recent [employer] you may have received from other than [facility] providers in the past year (date may be approximate).     Assessment:   This is a routine wellness examination for [place].  Virtual Visit via Telephone Note  I connected with  Brenda Leon on 03/08/21 at  3:30 PM EST by telephone and verified that I am speaking with the correct person using two identifiers.  Persons participating in the virtual visit: patient/Nurse Health Advisor   I discussed the limitations, risks, security and privacy concerns of performing an evaluation and management service by telephone and the availability of in person appointments. The patient expressed understanding and agreed to proceed.  Interactive audio and video telecommunications were attempted between this nurse and patient, however failed, due to patient having technical difficulties OR patient did not have access to video capability.  We continued and completed visit with audio only.  Some vital signs may be absent or patient reported.   Hearing/Vision screen Hearing Screening - Comments:: Patient is able to hear conversational tones without difficulty. No issues reported. Vision Screening - Comments:: Followed by Luberta Mutter, [place] (Opth)  Wears  glasses    Dietary issues and exercise activities discussed: Current Exercise Habits: Home exercise routine, Intensity: Mild Healthy diet Good water intake  Goals Addressed             This Visit's Progress    Increase physical activity       Increase water intake       STAY HYDRATED AND DRINK PLENTY OF FLUIDS       Depression Screen PHQ 2/9 Scores 02/28/2021 11/19/2019 01/08/2019 09/24/2018 06/17/2017 10/19/2015 07/02/2013  PHQ - 2 Score 4 0 0 0 2 3 1   PHQ- 9 Score 12 - - - 7 6 -    Fall Risk Fall Risk  03/08/2021 02/28/2021 06/23/2020 11/19/2019 01/27/2019  Falls in the past year? 1 1 0 0 0  Number falls in past yr: - 0 - 0 -  Injury with Fall? - 1 - 0 -  Risk for fall due to : - History of fall(s) - - -  Follow up - Falls evaluation completed Falls evaluation completed Falls evaluation completed Falls evaluation completed    [place]: Home free of loose throw rugs in walkways, pet beds, electrical cords, etc? Yes  Adequate lighting in your home to reduce risk of falls? Yes   ASSISTIVE DEVICES UTILIZED TO PREVENT FALLS: Use of a cane, walker or w/c? No   TIMED UP AND GO: Was the test performed? No .   Cognitive Function: Patient is alert and oriented x3.  MMSE - Mini Mental State Exam 10/19/2015  Orientation to time 5  Orientation to Place 5  Registration 3  Attention/ Calculation 5  Recall 3  Language- name 2 objects 2  Language- repeat 1  Language- follow 3 step command 3  Language- read & follow direction 1  Write a sentence 1  Copy design 1  Total score 30     6CIT Screen 09/24/2018  What Year? 0 points  What month? 0 points  What time? 0 points  Count back from 20 0 points  Months in reverse  0 points   Immunizations Immunization History  Administered Date(s) Administered   Fluad Quad(high Dose 65+) 12/18/2018, 02/28/2021   Influenza Split 12/11/2011   Influenza,inj,Quad PF,6+ Mos 02/23/2013, 01/05/2014, 11/03/2014,  10/19/2015   PFIZER(Purple Top)SARS-COV-2 Vaccination 04/17/2019, 05/11/2019, 12/06/2019, 09/07/2020   Pneumococcal Conjugate-13 10/19/2015   Pneumococcal Polysaccharide-23 02/10/2012   Tdap 04/12/2010   Zoster Recombinat (Shingrix) 03/06/2018   Zoster, Live 01/15/2013   TDAP status: Due, Education has been provided regarding the importance of this vaccine. Advised may receive this vaccine at local pharmacy or Health Dept. Aware to provide a copy of the vaccination record if obtained from local pharmacy or Health Dept. Verbalized acceptance and understanding. Deferred.    Shingrix Completed?: No.    Education has been provided regarding the importance of this vaccine. Patient has been advised to call insurance company to determine out of pocket expense if they have not yet received this vaccine. Advised may also receive vaccine at local pharmacy or Health Dept. Verbalized acceptance and understanding.  Screening Tests Health Maintenance  Topic Date Due   COVID-19 Vaccine (5 - Booster for Pfizer series) 03/16/2021 (Originally 11/02/2020)   Zoster Vaccines- Shingrix (2 of 2) 06/06/2021 (Originally 05/01/2018)   TETANUS/TDAP  03/08/2022 (Originally 04/12/2020)   Fecal DNA (Cologuard)  02/03/2022   MAMMOGRAM  06/03/2022   Pneumonia Vaccine 71+ Years old  Completed   INFLUENZA VACCINE  Completed   DEXA SCAN  Completed   Hepatitis C Screening  Completed   HPV VACCINES  Aged Out   COLONOSCOPY (Pts 45-88yrs Insurance coverage will need to be confirmed)  Discontinued   Health Maintenance There are no preventive care reminders to display for this patient.  Lung Cancer Screening: (Low Dose CT Chest recommended if Age 13-80 years, 30 pack-year currently smoking OR have quit w/in 15years.) does not qualify.   Vision Screening: Recommended annual ophthalmology exams for early detection of glaucoma and other disorders of the eye.  Dental Screening: Recommended annual dental exams for proper oral  hygiene  Community Resource Referral / Chronic Care Management: CRR required this visit?  No   CCM required this visit?  No      Plan:   Keep all routine maintenance appointments.   I have personally reviewed and noted the following in the patients chart:   Medical and social history Use of alcohol, tobacco or illicit drugs  Current medications and supplements including opioid prescriptions. Not taking opioid.  Functional ability and status Nutritional status Physical activity Advanced directives List of other physicians Hospitalizations, surgeries, and ER visits in previous 12 months Vitals Screenings to include cognitive, depression, and falls Referrals and appointments  In addition, I have reviewed and discussed with patient certain preventive protocols, quality metrics, and best practice recommendations. A written personalized care plan for preventive services as well as general preventive health recommendations were provided to patient.     Varney Biles, LPN   7/74/1287

## 2021-03-08 NOTE — Patient Instructions (Addendum)
°  Brenda Leon , Thank you for taking time to come for your Medicare Wellness Visit. I appreciate your ongoing commitment to your health goals. Please review the following plan we discussed and let me know if I can assist you in the future.   These are the goals we discussed:  Goals       Patient Stated     Healthy diet (pt-stated)      Other     Increase physical activity      Increase water intake      STAY HYDRATED AND DRINK PLENTY OF FLUIDS        This is a list of the screening recommended for you and due dates:  Health Maintenance  Topic Date Due   COVID-19 Vaccine (5 - Booster for Pfizer series) 03/16/2021*   Zoster (Shingles) Vaccine (2 of 2) 06/06/2021*   Tetanus Vaccine  03/08/2022*   Cologuard (Stool DNA test)  02/03/2022   Mammogram  06/03/2022   Pneumonia Vaccine  Completed   Flu Shot  Completed   DEXA scan (bone density measurement)  Completed   Hepatitis C Screening: USPSTF Recommendation to screen - Ages 21-79 yo.  Completed   HPV Vaccine  Aged Out   Colon Cancer Screening  Discontinued  *Topic was postponed. The date shown is not the original due date.

## 2021-03-12 ENCOUNTER — Encounter: Payer: Self-pay | Admitting: Internal Medicine

## 2021-03-12 NOTE — Telephone Encounter (Signed)
Need to confirm how long she has been on cymbalta.  This is not a medication that you can just stop (especially if has been on the medication for a while).  Will need to taper off.  I can help her taper, but she needs a f/u appt with Dr Derrel Nip as well for the other issues.

## 2021-03-13 ENCOUNTER — Encounter: Payer: Self-pay | Admitting: Internal Medicine

## 2021-03-13 NOTE — Telephone Encounter (Signed)
The message got sent to pt and not the provider.   Spoke with pt and she has been taking the Cymbalta for 10 days. Pt stated that she has a follow up scheduled.    Janett Billow, CMA

## 2021-03-22 ENCOUNTER — Ambulatory Visit (INDEPENDENT_AMBULATORY_CARE_PROVIDER_SITE_OTHER): Payer: Medicare HMO

## 2021-03-22 DIAGNOSIS — Z630 Problems in relationship with spouse or partner: Secondary | ICD-10-CM

## 2021-03-22 DIAGNOSIS — F411 Generalized anxiety disorder: Secondary | ICD-10-CM

## 2021-03-22 DIAGNOSIS — M328 Other forms of systemic lupus erythematosus: Secondary | ICD-10-CM

## 2021-03-22 DIAGNOSIS — F419 Anxiety disorder, unspecified: Secondary | ICD-10-CM

## 2021-03-22 DIAGNOSIS — E785 Hyperlipidemia, unspecified: Secondary | ICD-10-CM

## 2021-03-22 NOTE — Patient Instructions (Addendum)
Visit Information  Thank you for taking time to visit with me today. Please don't hesitate to contact me if I can be of assistance to you before our next scheduled telephone appointment.  Following are the goals we discussed today:  - begin personal counseling - start or continue a personal journal - talk about feelings with a friend, family or spiritual advisor - practice positive thinking and self-talk  Our next appointment is by telephone on 04/05/21 at 3pm  Please call the care guide team at [phone] if you need to cancel or reschedule your appointment.   If you are experiencing a Mental Health or [place] or need someone to talk to, please call the Suicide and Crisis Lifeline: 5   Following is a copy of your full plan of care:  Care Plan : General Social Work (Adult)  Updates made by [employer], Darla Lesches, LCSW since 03/22/2021 12:00 AM     Problem: [facility] AMB "PATIENT-SPECIFIC PROBLEM"   Note:   CARE PLAN ENTRY (see longitudinal plan of care for additional care plan information)  Current Barriers:  Knowledge deficits related to accessing mental health provider in patient with  Marital Conflict   Patient is experiencing symptoms of depression which seem to be exacerbated by conflicts with spouse Patient needs Support, Education, and Care Coordination in order to meet unmet mental health needs  Mental Health Concerns   Clinical Social Work Goal(s):  Over the next 90 days, patient will work with SW bi-weekly by telephone or in person to reduce or manage symptoms of depression until connected for ongoing counseling resources.  Patient will implement clinical interventions discussed today to decreases symptoms of depression and increase knowledge and/or ability of: coping skills.  Interventions:  Assessed patient's understanding, education, previous treatment and care coordination needs  Patient interviewed and appropriate assessments performed: PHQ 2 PHQ  9 Provided basic mental health support, education and interventions  Mental health treatment history explored, patient discussed not being in treatment currently due to affordability Feeling regarding marital conflicts and depressive symptoms processed- coping strategies discussed including communication of needs and managing expectations Discussed options for long term counseling based on need and insurance  Encouraged patient to check her insurance coverage to determine co-pay requirements Other interventions include: Motivational Interviewing employed Active listening / Reflection utilized  Engineer, petroleum Provided   Patient Self Care Activities & Deficits:  Patient is unable to independently navigate community resource options without care coordination support Patient is able to implement clinical interventions discussed today and is motivated for treatment  Patient will select one of the agencies from the list provided and call to schedule an appointment  Performs ADL's independently Performs IADL's independently Motivation for treatment  Initial goal documentation      Ms. Seat was given information about Care Management services by the embedded care coordination team including:  Care Management services include personalized support from designated clinical staff supervised by her physician, including individualized plan of care and coordination with other care providers 24/7 contact phone numbers for assistance for urgent and routine care needs. The patient may stop CCM services at any time (effective at the end of the month) by phone call to the office staff.  Patient agreed to services and verbal consent obtained.   Patient verbalizes understanding of instructions and care plan provided today and agrees to view in [place]. Active MyChart status confirmed with patient.    Telephone follow up appointment with care management team member scheduled for: 04/05/21  [address],  [place] [phone]

## 2021-03-22 NOTE — Chronic Care Management (AMB) (Signed)
Chronic Care Management    Clinical Social Work Note  03/22/2021 Name: Brenda Leon MRN: [id number] DOB: 21-Aug-1945  Brenda Leon is a 76 y.o. year old female who is a primary care patient of Derrel Nip, Aris Everts, MD. The CCM team was consulted to assist the patient with chronic disease management and/or care coordination needs related to: Mental Health Counseling and Resources.   Engaged with patient by telephone for initial visit in response to provider referral for social work chronic care management and care coordination services.   Consent to Services:  The patient was given the following information about Chronic Care Management services today, agreed to services, and gave verbal consent: 1. CCM service includes personalized support from designated clinical staff supervised by the primary care provider, including individualized plan of care and coordination with other care providers 2. 24/7 contact phone numbers for assistance for urgent and routine care needs. 3. Service will only be billed when office clinical staff spend 20 minutes or more in a month to coordinate care. 4. Only one practitioner may furnish and bill the service in a calendar month. 5.The patient may stop CCM services at any time (effective at the end of the month) by phone call to the office staff. 6. The patient will be responsible for cost sharing (co-pay) of up to 20% of the service fee (after annual deductible is met). Patient agreed to services and consent obtained.  Patient agreed to services and consent obtained.   Assessment: Review of patient past medical history, allergies, medications, and health status, including review of relevant consultants reports was performed today as part of a comprehensive evaluation and provision of chronic care management and care coordination services.     SDOH (Social Determinants of Health) assessments and interventions performed:    Advanced Directives Status: Not addressed in  this encounter.  CCM Care Plan  No Known Allergies  Outpatient Encounter Medications as of 03/22/2021  Medication Sig   acetaminophen (TYLENOL) 500 MG tablet Take 650 mg by mouth every 6 (six) hours as needed.   ALPRAZolam (XANAX) 0.25 MG tablet Take 1 tablet (0.25 mg total) by mouth at bedtime as needed for anxiety.   b complex vitamins tablet Take 1 tablet by mouth daily.   Cholecalciferol (VITAMIN D3) 1000 units CAPS Take 2,000 Units by mouth daily. Take 4,000 units daily   DULoxetine (CYMBALTA) 30 MG capsule Take 1 capsule (30 mg total) by mouth daily.   Flaxseed, Linseed, (FLAX SEED OIL PO) Take by mouth.   Horse Chestnut 300 MG CAPS Take 1 capsule by mouth daily.   hydroxychloroquine (PLAQUENIL) 200 MG tablet Take 200 mg by mouth 2 (two) times daily.   MAGNESIUM GLUCONATE PO Take 1 tablet by mouth.   Multiple Vitamins-Minerals (PRESERVISION AREDS 2+MULTI VIT PO) Take 1 capsule by mouth.   naproxen sodium (ALEVE) 220 MG tablet Take 220 mg by mouth.   omeprazole (PRILOSEC) 20 MG capsule Take by mouth.   Red Yeast Rice Extract 600 MG CAPS Take 1 capsule (600 mg total) by mouth daily. (Patient taking differently: Take 1 capsule by mouth 2 (two) times daily.)   No facility-administered encounter medications on file as of 03/22/2021.    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   Postmenopausal atrophic vaginitis 05/24/2012   Generalized anxiety disorder 05/24/2012   Routine general medical examination at a health care facility 05/24/2012   Pain in limb 05/18/2012   Leg pain, diffuse 02/10/2012   SLE (systemic lupus erythematosus) ([place]) 02/10/2012   GERD (gastroesophageal reflux  disease) 02/10/2012   Lumbago with sciatica 02/10/2012   Hyperlipidemia with target LDL less than 130 02/10/2012    Conditions to be addressed/monitored: Depression; Mental Health Concerns   Care Plan : General Social Work (Adult)  Updates made by Vern Claude, LCSW since 03/22/2021 12:00 AM     Problem: CHL AMB "PATIENT-SPECIFIC PROBLEM"   Note:   CARE PLAN ENTRY (see longitudinal plan of care for additional care plan information)  Current Barriers:  Knowledge deficits related to accessing mental health provider in patient with  Marital Conflict   Patient is experiencing symptoms of depression which seem to be exacerbated by conflicts with spouse Patient needs Support, Education, and Care Coordination in order to meet unmet mental health needs  Mental Health Concerns   Clinical Social Work Goal(s):  Over the next 90 days, patient will work with SW bi-weekly by telephone or in person to reduce or manage symptoms of depression until connected for ongoing counseling resources.  Patient will implement clinical interventions discussed today to decreases symptoms of depression and increase knowledge and/or ability of: coping skills.  Interventions:  Assessed patient's understanding, education, previous treatment and care coordination needs  Patient interviewed and appropriate assessments performed: PHQ 2 PHQ 9 Provided basic mental health support, education and interventions  Mental health treatment history explored, patient discussed not being in treatment currently due to affordability Feeling regarding marital conflicts and depressive symptoms processed- coping strategies discussed including communication of needs and managing expectations Discussed options for long term counseling based on need and insurance  Encouraged patient to check her insurance coverage to determine co-pay requirements Other interventions include: Motivational Interviewing employed Active listening /  Reflection utilized  Engineer, petroleum Provided   Patient Self Care Activities & Deficits:  Patient is unable to independently navigate community resource options without care coordination support Patient is able to implement clinical interventions discussed today and is motivated for treatment  Patient will select one of the agencies from the list provided and call to schedule an appointment  Performs ADL's independently Performs IADL's independently Motivation for treatment  Initial goal documentation       Follow Up Plan: Appointment scheduled for SW follow up with client by phone on: 04/05/21      Elliot Gurney, [place] [phone]

## 2021-03-26 ENCOUNTER — Encounter: Payer: Self-pay | Admitting: Internal Medicine

## 2021-03-27 ENCOUNTER — Telehealth: Payer: Medicare HMO

## 2021-03-27 DIAGNOSIS — F32A Depression, unspecified: Secondary | ICD-10-CM

## 2021-03-27 DIAGNOSIS — E785 Hyperlipidemia, unspecified: Secondary | ICD-10-CM

## 2021-03-29 ENCOUNTER — Telehealth: Payer: Medicare HMO

## 2021-04-03 ENCOUNTER — Telehealth: Payer: Self-pay | Admitting: *Deleted

## 2021-04-03 NOTE — Telephone Encounter (Signed)
° °  04/03/2021  Brenda Leon 1945-07-22 [id number]   Phone call from patient needing to re-schedule phone visit. Visit rescheduled to 04/10/21 at 9:30am.   Elliot Gurney, [place] [phone]

## 2021-04-10 ENCOUNTER — Telehealth: Payer: Medicare HMO | Admitting: *Deleted

## 2021-04-10 DIAGNOSIS — H04123 Dry eye syndrome of bilateral lacrimal glands: Secondary | ICD-10-CM | POA: Diagnosis not present

## 2021-04-10 DIAGNOSIS — Z79899 Other long term (current) drug therapy: Secondary | ICD-10-CM | POA: Diagnosis not present

## 2021-04-12 DIAGNOSIS — Z79899 Other long term (current) drug therapy: Secondary | ICD-10-CM | POA: Diagnosis not present

## 2021-04-12 DIAGNOSIS — M19041 Primary osteoarthritis, right hand: Secondary | ICD-10-CM | POA: Diagnosis not present

## 2021-04-12 DIAGNOSIS — R5382 Chronic fatigue, unspecified: Secondary | ICD-10-CM | POA: Diagnosis not present

## 2021-04-12 DIAGNOSIS — M3219 Other organ or system involvement in systemic lupus erythematosus: Secondary | ICD-10-CM | POA: Diagnosis not present

## 2021-04-12 DIAGNOSIS — M19042 Primary osteoarthritis, left hand: Secondary | ICD-10-CM | POA: Diagnosis not present

## 2021-04-20 ENCOUNTER — Encounter: Payer: Self-pay | Admitting: Internal Medicine

## 2021-04-24 ENCOUNTER — Telehealth: Payer: Self-pay | Admitting: Internal Medicine

## 2021-04-24 ENCOUNTER — Ambulatory Visit (INDEPENDENT_AMBULATORY_CARE_PROVIDER_SITE_OTHER): Payer: Medicare HMO | Admitting: Internal Medicine

## 2021-04-24 ENCOUNTER — Encounter: Payer: Self-pay | Admitting: Internal Medicine

## 2021-04-24 ENCOUNTER — Other Ambulatory Visit: Payer: Self-pay

## 2021-04-24 VITALS — BP 132/68 | HR 84 | Temp 98.4°F | Ht 67.5 in | Wt 213.0 lb

## 2021-04-24 DIAGNOSIS — K219 Gastro-esophageal reflux disease without esophagitis: Secondary | ICD-10-CM

## 2021-04-24 DIAGNOSIS — G8929 Other chronic pain: Secondary | ICD-10-CM | POA: Diagnosis not present

## 2021-04-24 DIAGNOSIS — M5440 Lumbago with sciatica, unspecified side: Secondary | ICD-10-CM

## 2021-04-24 DIAGNOSIS — M328 Other forms of systemic lupus erythematosus: Secondary | ICD-10-CM | POA: Diagnosis not present

## 2021-04-24 DIAGNOSIS — I700 Atherosclerosis of aorta: Secondary | ICD-10-CM | POA: Diagnosis not present

## 2021-04-24 DIAGNOSIS — R1012 Left upper quadrant pain: Secondary | ICD-10-CM | POA: Diagnosis not present

## 2021-04-24 DIAGNOSIS — R109 Unspecified abdominal pain: Secondary | ICD-10-CM | POA: Insufficient documentation

## 2021-04-24 DIAGNOSIS — K2900 Acute gastritis without bleeding: Secondary | ICD-10-CM | POA: Diagnosis not present

## 2021-04-24 DIAGNOSIS — Z630 Problems in relationship with spouse or partner: Secondary | ICD-10-CM

## 2021-04-24 DIAGNOSIS — R69 Illness, unspecified: Secondary | ICD-10-CM | POA: Diagnosis not present

## 2021-04-24 LAB — CBC WITH DIFFERENTIAL/PLATELET
Basophils Absolute: 0 10*3/uL (ref 0.0–0.1)
Basophils Relative: 0.8 % (ref 0.0–3.0)
Eosinophils Absolute: 0.2 10*3/uL (ref 0.0–0.7)
Eosinophils Relative: 3.2 % (ref 0.0–5.0)
HCT: 37 % (ref 36.0–46.0)
Hemoglobin: 12.4 g/dL (ref 12.0–15.0)
Lymphocytes Relative: 24.4 % (ref 12.0–46.0)
Lymphs Abs: 1.4 10*3/uL (ref 0.7–4.0)
MCHC: 33.4 g/dL (ref 30.0–36.0)
MCV: 90.2 fl (ref 78.0–100.0)
Monocytes Absolute: 0.4 10*3/uL (ref 0.1–1.0)
Monocytes Relative: 7.3 % (ref 3.0–12.0)
Neutro Abs: 3.8 10*3/uL (ref 1.4–7.7)
Neutrophils Relative %: 64.3 % (ref 43.0–77.0)
Platelets: 224 10*3/uL (ref 150.0–400.0)
RBC: 4.1 Mil/uL (ref 3.87–5.11)
RDW: 13.5 % (ref 11.5–15.5)
WBC: 5.9 10*3/uL (ref 4.0–10.5)

## 2021-04-24 LAB — COMPREHENSIVE METABOLIC PANEL
ALT: 18 U/L (ref 0–35)
AST: 18 U/L (ref 0–37)
Albumin: 4.5 g/dL (ref 3.5–5.2)
Alkaline Phosphatase: 59 U/L (ref 39–117)
BUN: 23 mg/dL (ref 6–23)
CO2: 26 mEq/L (ref 19–32)
Calcium: 9.4 mg/dL (ref 8.4–10.5)
Chloride: 104 mEq/L (ref 96–112)
Creatinine, Ser: 0.75 mg/dL (ref 0.40–1.20)
GFR: 78.34 mL/min (ref >60.00)
Glucose, Bld: 77 mg/dL (ref 70–99)
Potassium: 3.9 mEq/L (ref 3.5–5.1)
Sodium: 140 mEq/L (ref 135–145)
Total Bilirubin: 0.4 mg/dL (ref 0.2–1.2)
Total Protein: 7 g/dL (ref 6.0–8.3)

## 2021-04-24 LAB — IBC + FERRITIN
Ferritin: 59.1 ng/mL (ref 10.0–291.0)
Iron: 94 ug/dL (ref 42–145)
Saturation Ratios: 22.2 % (ref 20.0–50.0)
TIBC: 422.8 ug/dL (ref 250.0–450.0)
Transferrin: 302 mg/dL (ref 212.0–360.0)

## 2021-04-24 LAB — LIPASE: Lipase: 28 U/L (ref 11.0–59.0)

## 2021-04-24 MED ORDER — OMEPRAZOLE 40 MG PO CPDR: 40.0000 mg | DELAYED_RELEASE_CAPSULE | Freq: Every day | ORAL | 2 refills | Status: DC

## 2021-04-24 MED ORDER — TRAMADOL HCL 50 MG PO TABS: 50.0000 mg | ORAL_TABLET | Freq: Two times a day (BID) | ORAL | 0 refills | Status: AC

## 2021-04-24 NOTE — Assessment & Plan Note (Signed)
Incidental finding on CT angiogram done during ER evaluation  In Nov 2020 for COVID RELATED respiratory failure.  Statin therapy has been repeatedly advised but deferred by patient  In favor of nutraceuticals (red yeast rice)

## 2021-04-24 NOTE — Assessment & Plan Note (Addendum)
Now with gastritis symptoms during chronic NSAID use .  Stopping naproxen,  increase omeprazole to 40 mg daily ,  Checking FOBT

## 2021-04-24 NOTE — Patient Instructions (Signed)
I am treating you for gastritis while I rule out other causes of your pain.  Stop all NSAIDS (naproxen, aleve, motrin, advil)  Use the tramadol twice daily and continue tylenol for pain  Increase the omeprazole to 40 mg daily on an empty stomach   /ultrasound of abdomen ordered   RTC 3 weeks

## 2021-04-24 NOTE — Telephone Encounter (Signed)
Lft pt vm to call ofc to sch US. thanks ?

## 2021-04-24 NOTE — Assessment & Plan Note (Addendum)
Cause of her LUQ pain (intermittent) unclear.  Pain is stabbing.  U/s ordered along with lipase

## 2021-04-24 NOTE — Assessment & Plan Note (Signed)
She loves her husband but has no intimacy or companionship in her current marriage (26rd).  counselling given .referral to couples marriage counsellors accepted but they cannot afford therapy.  She is requesting a prescription for a divorce.

## 2021-04-24 NOTE — Assessment & Plan Note (Signed)
Managed with placquenil

## 2021-04-24 NOTE — Progress Notes (Signed)
Subjective:  Patient ID: Brenda Leon, female    DOB: [birth year]  Age: 76 y.o. MRN: [id number]  CC: The primary encounter diagnosis was Acute gastritis without hemorrhage, unspecified gastritis type. Diagnoses of Other forms of systemic lupus erythematosus, unspecified organ involvement status ([place]), Thoracic aortic atherosclerosis ([place]), Left upper quadrant abdominal pain, Chronic low back pain with sciatica, sciatica laterality unspecified, unspecified back pain laterality, Gastroesophageal reflux disease without esophagitis, and Marital stress were also pertinent to this visit.   This visit occurred during the SARS-CoV-2 public health emergency.  Safety protocols were in place, including screening questions prior to the visit, additional usage of staff PPE, and extensive cleaning of exam room while observing appropriate contact time as indicated for disinfecting solutions.    HPI ARIZONA SORN presents for  Chief Complaint  Patient presents with   Acute Visit    Change in stools. Pt stated that she has had some color change, frequency and loose stools, the color hs started to return to normal after she stopped taking the Pepto-Bismol. Pt also stated that she has some discomfort in upper abdomen after eating and some left side pain after eating.     1) Change in bowel habits: occasionally loose,  never any visible blood.    2) marital conflict:  worse started around Feb 9 when husband had several teeth pulled and required a lot more attention for about ten days.  Mores stress in home living with [name].  S 3) Upper abdominal pain   described as upper abdominal discomfort ,  aggravated by eating ; described as gnawing ;  also has pain is jabbing and episodic on the left side not daily . [place] Kitchen No nausea or weight loss  no dark urine.     Taking naproxen daily for hip and knee pain.  Also taking omeprazole 20 mg daily   History of EGD for dysphagia remotely. EGD was negative Last colonscopy  2006.  Last Cologuard Dec 2020  Past Surgical History:  Procedure Laterality Date   ABDOMINAL HYSTERECTOMY  1987   APPENDECTOMY  1952   BREAST CYST ASPIRATION Right 25 + yrs ago   neg   CATARACT EXTRACTION, BILATERAL Bilateral 1995, [place]   x2 1995 and 1996   KNEE ARTHROSCOPY W/ ACL RECONSTRUCTION     STRABISMUS SURGERY Bilateral 09/12/2017   Procedure: BILATERAL STRABISMUS REPAIR;  Surgeon: Everitt Amber, MD;  Location: [facility];  Service: Ophthalmology;  Laterality: Bilateral;   TONSILLECTOMY AND ADENOIDECTOMY      Outpatient Medications Prior to Visit  Medication Sig Dispense Refill   acetaminophen (TYLENOL) 500 MG tablet Take 650 mg by mouth every 6 (six) hours as needed.     ALPRAZolam (XANAX) 0.25 MG tablet Take 1 tablet (0.25 mg total) by mouth at bedtime as needed for anxiety. 30 tablet 5   b complex vitamins tablet Take 1 tablet by mouth daily.     Cholecalciferol (VITAMIN D3) 1000 units CAPS Take 2,000 Units by mouth daily. Take 4,000 units daily     Flaxseed, Linseed, (FLAX SEED OIL PO) Take by mouth.     Horse Chestnut 300 MG CAPS Take 1 capsule by mouth daily.     hydroxychloroquine (PLAQUENIL) 200 MG tablet Take 200 mg by mouth 2 (two) times daily.     MAGNESIUM GLUCONATE PO Take 1 tablet by mouth.     Multiple Vitamins-Minerals (PRESERVISION AREDS 2+MULTI VIT PO) Take 1 capsule by mouth.  naproxen sodium (ALEVE) 220 MG tablet Take 220 mg by mouth.     Red Yeast Rice Extract 600 MG CAPS Take 1 capsule (600 mg total) by mouth daily. (Patient taking differently: Take 1 capsule by mouth 2 (two) times daily.) 60 capsule 0   DULoxetine (CYMBALTA) 30 MG capsule Take 1 capsule (30 mg total) by mouth daily. 30 capsule 3   omeprazole (PRILOSEC) 20 MG capsule Take by mouth.     No facility-administered medications prior to visit.    Review of Systems;  Patient denies headache, fevers, malaise, unintentional weight loss, skin rash, eye  pain, sinus congestion and sinus pain, sore throat, dysphagia,  hemoptysis , cough, dyspnea, wheezing, chest pain, palpitations, orthopnea, edema, abdominal pain, nausea, melena, diarrhea, constipation, flank pain, dysuria, hematuria, urinary  Frequency, nocturia, numbness, tingling, seizures,  Focal weakness, Loss of consciousness,  Tremor, insomnia, depression, anxiety, and suicidal ideation.      Objective:  BP 132/68 (BP Location: Left Arm, Patient Position: Sitting, Cuff Size: Large)    Pulse 84    Temp 98.4 F (36.9 C) (Oral)    Ht 5' 7.5" (1.715 m)    Wt 213 lb (96.6 kg)    SpO2 96%    BMI 32.87 kg/m   BP Readings from Last 3 Encounters:  04/24/21 132/68  02/28/21 140/88  02/14/21 (!) 162/76    Wt Readings from Last 3 Encounters:  04/24/21 213 lb (96.6 kg)  03/08/21 213 lb (96.6 kg)  02/28/21 213 lb (96.6 kg)    General appearance: alert, cooperative and appears stated age Ears: normal TM's and external ear canals both ears Throat: lips, mucosa, and tongue normal; teeth and gums normal Neck: no adenopathy, no carotid bruit, supple, symmetrical, trachea midline and thyroid not enlarged, symmetric, no tenderness/mass/nodules Back: symmetric, no curvature. ROM normal. No CVA tenderness. Lungs: clear to auscultation bilaterally Heart: regular rate and rhythm, S1, S2 normal, no murmur, click, rub or gallop Abdomen: soft, non-tender; bowel sounds normal; no masses,  no organomegaly Pulses: 2+ and symmetric Skin: Skin color, texture, turgor normal. No rashes or lesions Lymph nodes: Cervical, supraclavicular, and axillary nodes normal.  Lab Results  Component Value Date   HGBA1C 6.0 06/27/2020    Lab Results  Component Value Date   CREATININE 0.72 06/27/2020   CREATININE 0.51 01/22/2019   CREATININE 0.66 10/29/2018    Lab Results  Component Value Date   WBC 4.0 01/22/2019   HGB 11.0 (L) 01/22/2019   HCT 33.6 (L) 01/22/2019   PLT 199 01/22/2019   GLUCOSE 95 06/27/2020    CHOL 207 (H) 06/27/2020   TRIG 98.0 06/27/2020   HDL 48.60 06/27/2020   LDLDIRECT 167.8 02/14/2012   LDLCALC 139 (H) 06/27/2020   ALT 17 06/27/2020   AST 15 06/27/2020   NA 137 06/27/2020   K 4.4 06/27/2020   CL 102 06/27/2020   CREATININE 0.72 06/27/2020   BUN 22 06/27/2020   CO2 28 06/27/2020   TSH 2.55 06/27/2020   HGBA1C 6.0 06/27/2020   MICROALBUR <0.7 06/27/2020    MM DIAG BREAST TOMO BILATERAL  Result Date: 06/02/2020 CLINICAL DATA:  76 year old female with intermittent OUTER LEFT breast pain for several months and for annual bilateral mammogram. EXAM: DIGITAL DIAGNOSTIC BILATERAL MAMMOGRAM WITH CAD AND TOMO COMPARISON:  Previous exam(s). ACR Breast Density Category b: There are scattered areas of fibroglandular density. FINDINGS: 2D/3D full field views of both breasts demonstrate no suspicious mass, distortion or worrisome calcifications. Mammographic images were processed with  CAD. IMPRESSION: No evidence of breast malignancy. RECOMMENDATION: Bilateral screening mammogram in 1 year. I have discussed the findings, causes and remedies of breast pain and recommendations with the patient. If applicable, a reminder letter will be sent to the patient regarding the next appointment. BI-RADS CATEGORY  1: Negative. Electronically Signed   By: Margarette Canada M.D.   On: 06/02/2020 14:38    Assessment & Plan:   Problem List Items Addressed This Visit     SLE (systemic lupus erythematosus) ([place])    Managed with placquenil      GERD (gastroesophageal reflux disease)    Now with gastritis symptoms during chronic NSAID use .  Stopping naproxen,  increase omeprazole to 40 mg daily ,  Checking FOBT       Relevant Medications   omeprazole (PRILOSEC) 40 MG capsule   Lumbago with sciatica   Relevant Medications   traMADol (ULTRAM) 50 MG tablet   Thoracic aortic atherosclerosis ([place])    Incidental finding on CT angiogram done during ER evaluation  In Nov 2020 for COVID RELATED respiratory  failure.  Statin therapy has been repeatedly advised but deferred by patient  In favor of nutraceuticals (red yeast rice)      Marital stress    She loves her husband but has no intimacy or companionship in her current marriage (7rd).  counselling given .referral to couples marriage counsellors accepted but they cannot afford therapy.  She is requesting a prescription for a divorce.       Abdominal pain    Cause of her LUQ pain (intermittent) unclear.  Pain is stabbing.  U/s ordered along with lipase      Relevant Orders   Lipase   US Abdomen Complete   Other Visit Diagnoses     Acute gastritis without hemorrhage, unspecified gastritis type    -  Primary   Relevant Orders   Fecal occult blood, imunochemical   Comprehensive metabolic panel   CBC with Differential/Platelet   IBC + Ferritin   US Abdomen Complete       I spent 30 minutes dedicated to the care of this patient on the date of this encounter to include pre-visit review of patient's medical history,  most recent imaging studies, Face-to-face time with the patient , and post visit ordering of testing and therapeutics.    Follow-up: No follow-ups on file.   Crecencio Mc, MD

## 2021-04-25 DIAGNOSIS — M179 Osteoarthritis of knee, unspecified: Secondary | ICD-10-CM | POA: Insufficient documentation

## 2021-04-25 DIAGNOSIS — M1712 Unilateral primary osteoarthritis, left knee: Secondary | ICD-10-CM | POA: Insufficient documentation

## 2021-04-26 ENCOUNTER — Other Ambulatory Visit (INDEPENDENT_AMBULATORY_CARE_PROVIDER_SITE_OTHER): Payer: Medicare HMO

## 2021-04-26 DIAGNOSIS — K2900 Acute gastritis without bleeding: Secondary | ICD-10-CM | POA: Diagnosis not present

## 2021-04-26 LAB — FECAL OCCULT BLOOD, IMMUNOCHEMICAL: Fecal Occult Bld: NEGATIVE

## 2021-05-04 ENCOUNTER — Encounter: Payer: Self-pay | Admitting: Internal Medicine

## 2021-05-04 ENCOUNTER — Other Ambulatory Visit: Payer: Self-pay

## 2021-05-04 ENCOUNTER — Ambulatory Visit
Admission: RE | Admit: 2021-05-04 | Discharge: 2021-05-04 | Disposition: A | Discharge status: Home / Self Care | Payer: Medicare HMO | Source: Ambulatory Visit | Attending: Internal Medicine | Admitting: Internal Medicine

## 2021-05-04 DIAGNOSIS — R109 Unspecified abdominal pain: Secondary | ICD-10-CM | POA: Diagnosis not present

## 2021-05-04 DIAGNOSIS — R1012 Left upper quadrant pain: Secondary | ICD-10-CM | POA: Insufficient documentation

## 2021-05-04 DIAGNOSIS — K2900 Acute gastritis without bleeding: Secondary | ICD-10-CM | POA: Insufficient documentation

## 2021-05-04 DIAGNOSIS — N281 Cyst of kidney, acquired: Secondary | ICD-10-CM | POA: Diagnosis not present

## 2021-05-25 ENCOUNTER — Ambulatory Visit: Payer: Medicare HMO | Admitting: Internal Medicine

## 2021-05-28 ENCOUNTER — Encounter: Payer: Self-pay | Admitting: Internal Medicine

## 2021-06-07 ENCOUNTER — Ambulatory Visit (INDEPENDENT_AMBULATORY_CARE_PROVIDER_SITE_OTHER): Payer: Medicare HMO | Admitting: Internal Medicine

## 2021-06-07 ENCOUNTER — Encounter: Payer: Self-pay | Admitting: Internal Medicine

## 2021-06-07 VITALS — BP 150/82 | HR 76 | Temp 98.0°F | Ht 67.5 in | Wt 209.4 lb

## 2021-06-07 DIAGNOSIS — K2960 Other gastritis without bleeding: Secondary | ICD-10-CM

## 2021-06-07 DIAGNOSIS — R7303 Prediabetes: Secondary | ICD-10-CM | POA: Diagnosis not present

## 2021-06-07 DIAGNOSIS — E785 Hyperlipidemia, unspecified: Secondary | ICD-10-CM

## 2021-06-07 DIAGNOSIS — T39395A Adverse effect of other nonsteroidal anti-inflammatory drugs [NSAID], initial encounter: Secondary | ICD-10-CM | POA: Diagnosis not present

## 2021-06-07 DIAGNOSIS — Z630 Problems in relationship with spouse or partner: Secondary | ICD-10-CM

## 2021-06-07 DIAGNOSIS — R7301 Impaired fasting glucose: Secondary | ICD-10-CM | POA: Diagnosis not present

## 2021-06-07 DIAGNOSIS — Z79899 Other long term (current) drug therapy: Secondary | ICD-10-CM | POA: Diagnosis not present

## 2021-06-07 DIAGNOSIS — R69 Illness, unspecified: Secondary | ICD-10-CM | POA: Diagnosis not present

## 2021-06-07 MED ORDER — ATORVASTATIN CALCIUM 20 MG PO TABS: 20.0000 mg | ORAL_TABLET | Freq: Every day | ORAL | 0 refills | Status: DC

## 2021-06-07 NOTE — Progress Notes (Signed)
? ?Subjective:  ?Patient ID: Brenda Leon, female    DOB: 1945-07-01  Age: 76 y.o. MRN: [id number] ? ?CC: The primary encounter diagnosis was Hyperlipidemia with target LDL less than 130. Diagnoses of Long-term use of high-risk medication, Impaired fasting glucose, Gastritis due to nonsteroidal anti-inflammatory drug, Prediabetes, and Marital stress were also pertinent to this visit. ? ? ?This visit occurred during the SARS-CoV-2 public health emergency.  Safety protocols were in place, including screening questions prior to the visit, additional usage of staff PPE, and extensive cleaning of exam room while observing appropriate contact time as indicated for disinfecting solutions.   ? ?HPI ?Brenda Leon presents for follow up on multiple issues.  Last seen one month ago ?Chief Complaint  ?Patient presents with  ? Follow-up  ?  Follow up on knee and hip pain, anxiety and depression  ? ?1) Gastritis due ot NSAIDS:  she had been taking Aleve daily for knee and hip pain.  Her FOBT was negative,  iron and CBC , lipase normal.  NSAIDS were stopped and omeprazole increased  to 40 mg  along with tramadol for pain management .  Her gastritis has resolved.  Her pain is controlled with tylenol and tramadol and she is scheduled to see orthopedics tomorrow.  ? ?2) marital conflict:  her husband  of 67 years , Irine Seal,  has become increasing difficult to live with due to mood lability, and irrational behavior.  She feels that he is very self centered., easily frustrated,  and childish.  She cannot afford to go to a marriage counsellor but states that he is willing to seek counselling/  ? ?3) obesity with fatty liver suggested by  recent abdominal  U/S:  has lost 7 lbs intentionally since her last visit based on her home scale readings by following the mediterranean diet,  cutting out sugar and increasing her intake of vegetables.  Willing to start statin.  ? ?Outpatient Medications Prior to Visit  ?Medication Sig Dispense  Refill  ? acetaminophen (TYLENOL) 500 MG tablet Take 650 mg by mouth every 6 (six) hours as needed.    ? ALPRAZolam (XANAX) 0.25 MG tablet Take 1 tablet (0.25 mg total) by mouth at bedtime as needed for anxiety. 30 tablet 5  ? b complex vitamins tablet Take 1 tablet by mouth daily.    ? Cholecalciferol (VITAMIN D3) 1000 units CAPS Take 2,000 Units by mouth daily. Take 4,000 units daily    ? Flaxseed, Linseed, (FLAX SEED OIL PO) Take by mouth.    ? Horse Chestnut 300 MG CAPS Take 1 capsule by mouth daily.    ? hydroxychloroquine (PLAQUENIL) 200 MG tablet Take 200 mg by mouth 2 (two) times daily.    ? MAGNESIUM GLUCONATE PO Take 1 tablet by mouth.    ? Multiple Vitamins-Minerals (PRESERVISION AREDS 2+MULTI VIT PO) Take 1 capsule by mouth.    ? omeprazole (PRILOSEC) 40 MG capsule Take 1 capsule (40 mg total) by mouth daily. 30 capsule 2  ? Red Yeast Rice Extract 600 MG CAPS Take 1 capsule (600 mg total) by mouth daily. (Patient taking differently: Take 1 capsule by mouth 2 (two) times daily.) 60 capsule 0  ? traMADol (ULTRAM) 50 MG tablet Take 50 mg by mouth as needed.    ? naproxen sodium (ALEVE) 220 MG tablet Take 220 mg by mouth. (Patient not taking: Reported on 06/07/2021)    ? ?No facility-administered medications prior to visit.  ? ? ?Review of Systems; ? ?  Patient denies headache, fevers, malaise, unintentional weight loss, skin rash, eye pain, sinus congestion and sinus pain, sore throat, dysphagia,  hemoptysis , cough, dyspnea, wheezing, chest pain, palpitations, orthopnea, edema, abdominal pain, nausea, melena, diarrhea, constipation, flank pain, dysuria, hematuria, urinary  Frequency, nocturia, numbness, tingling, seizures,  Focal weakness, Loss of consciousness,  Tremor, insomnia, depression, anxiety, and suicidal ideation.   ? ? ? ?Objective:  ?BP (!) 150/82 (BP Location: Left Arm, Patient Position: Sitting, Cuff Size: Normal)   Pulse 76   Temp 98 ?F (36.7 ?C) (Oral)   Ht 5' 7.5" (1.715 m)   Wt 209 lb  6.4 oz (95 kg)   SpO2 98%   BMI 32.31 kg/m?  ? ?BP Readings from Last 3 Encounters:  ?06/07/21 (!) 150/82  ?04/24/21 132/68  ?02/28/21 140/88  ? ? ?Wt Readings from Last 3 Encounters:  ?06/07/21 209 lb 6.4 oz (95 kg)  ?04/24/21 213 lb (96.6 kg)  ?03/08/21 213 lb (96.6 kg)  ? ? ?General appearance: alert, cooperative and appears stated age ?Ears: normal TM's and external ear canals both ears ?Throat: lips, mucosa, and tongue normal; teeth and gums normal ?Neck: no adenopathy, no carotid bruit, supple, symmetrical, trachea midline and thyroid not enlarged, symmetric, no tenderness/mass/nodules ?Back: symmetric, no curvature. ROM normal. No CVA tenderness. ?Lungs: clear to auscultation bilaterally ?Heart: regular rate and rhythm, S1, S2 normal, no murmur, click, rub or gallop ?Abdomen: soft, non-tender; bowel sounds normal; no masses,  no organomegaly ?Pulses: 2+ and symmetric ?Skin: Skin color, texture, turgor normal. No rashes or lesions ?Lymph nodes: Cervical, supraclavicular, and axillary nodes normal. ? ?Lab Results  ?Component Value Date  ? HGBA1C 6.0 06/27/2020  ? ? ?Lab Results  ?Component Value Date  ? CREATININE 0.75 04/24/2021  ? CREATININE 0.72 06/27/2020  ? CREATININE 0.51 01/22/2019  ? ? ?Lab Results  ?Component Value Date  ? WBC 5.9 04/24/2021  ? HGB 12.4 04/24/2021  ? HCT 37.0 04/24/2021  ? PLT 224.0 04/24/2021  ? GLUCOSE 77 04/24/2021  ? CHOL 207 (H) 06/27/2020  ? TRIG 98.0 06/27/2020  ? HDL 48.60 06/27/2020  ? LDLDIRECT 167.8 02/14/2012  ? LDLCALC 139 (H) 06/27/2020  ? ALT 18 04/24/2021  ? AST 18 04/24/2021  ? NA 140 04/24/2021  ? K 3.9 04/24/2021  ? CL 104 04/24/2021  ? CREATININE 0.75 04/24/2021  ? BUN 23 04/24/2021  ? CO2 26 04/24/2021  ? TSH 2.55 06/27/2020  ? HGBA1C 6.0 06/27/2020  ? MICROALBUR <0.7 06/27/2020  ? ? ?US Abdomen Complete ? ?Result Date: 05/04/2021 ?CLINICAL DATA:  Abdominal pain and distention cough for several weeks EXAM: ABDOMEN ULTRASOUND COMPLETE COMPARISON:  None FINDINGS:  Gallbladder: Normally distended without stones or wall thickening. No pericholecystic fluid or sonographic Murphy sign. Common bile duct: Diameter: 5 mm, normal Liver: Echogenic parenchyma, likely fatty infiltration though this can be seen with cirrhosis and certain infiltrative disorders. No focal hepatic mass or nodularity. Portal vein is patent on color Doppler imaging with normal direction of blood flow towards the liver. IVC: Normal appearance Pancreas: Normal appearance Spleen: Normal appearance, 8.2 cm length Right Kidney: Length: 12.2 cm. Normal cortical thickness and echogenicity. Cyst at mid kidney 19 x 21 x 19 mm, simple features. No additional mass or hydronephrosis. Left Kidney: Length: 13.6 cm. Normal cortical thickness and echogenicity. Small cyst mid kidney 8 x 7 x 7 mm, simple features. No additional mass or hydronephrosis Abdominal aorta: Normal caliber Other findings: No free fluid IMPRESSION: Probable fatty infiltration of liver  as above. Small BILATERAL renal cysts as above. No acute abnormalities. Electronically Signed   By: Lavonia Dana M.D.   On: 05/04/2021 16:33  ? ? ?Assessment & Plan:  ? ?Problem List Items Addressed This Visit   ? ? Gastritis due to nonsteroidal anti-inflammatory drug  ?  Secondary to daily use of naproxen. Symptoms have  Resolved with suspension of NSAIDS and  and use of 40 mg omeprazole daily for the past month.  FOBT was negative; CBC and lipase normal.  Advised to reduce PPI to 20 mg daily and AVOID ALL NSAIDS .  ADVISED TO REMIND ORTHOPEDIST OF THIS DIAGNOSIS ?  ?  ? Hyperlipidemia with target LDL less than 130 - Primary  ?  STARTING atorvastatin for concurrent fatty liver management.  Dc RYR  Return in 3 weeks for LFTs ?  ?  ? Relevant Medications  ? atorvastatin (LIPITOR) 20 MG tablet  ? Marital stress  ?  I have scheduled her for a 30 minute visit at end of day with husband to begin counselling  ?  ?  ? ?Other Visit Diagnoses   ? ? Long-term use of high-risk  medication      ? Relevant Orders  ? Comprehensive metabolic panel  ? Impaired fasting glucose      ? Relevant Orders  ? Comprehensive metabolic panel  ? Prediabetes      ? Relevant Orders  ? Hemoglobin A1c  ? ?  ?

## 2021-06-07 NOTE — Assessment & Plan Note (Signed)
STARTING atorvastatin for concurrent fatty liver management.  Dc RYR  Return in 3 weeks for LFTs ?

## 2021-06-07 NOTE — Assessment & Plan Note (Signed)
I have scheduled her for a 30 minute visit at end of day with husband to begin counselling  ?

## 2021-06-07 NOTE — Assessment & Plan Note (Addendum)
Secondary to daily use of naproxen. Symptoms have  Resolved with suspension of NSAIDS and  and use of 40 mg omeprazole daily for the past month.  FOBT was negative; CBC and lipase normal.  Advised to reduce PPI to 20 mg daily and AVOID ALL NSAIDS .  ADVISED TO REMIND ORTHOPEDIST OF THIS DIAGNOSIS ?

## 2021-06-07 NOTE — Patient Instructions (Addendum)
Stay off the NSAIDs ? ?Reduce the omeprazole to 20 mg daily  ? ?Use up to 100 mg tylenol plus tramadol daily if needed to control pain ? ?If gastritis returns , let me know  ? ?Start lipitor and sto red yeast rice ? ?Return In 3 weeks for a liver  test ? ?No fasting required ? ? ?Limit alcohol to one drink per night  ?

## 2021-06-08 DIAGNOSIS — M1712 Unilateral primary osteoarthritis, left knee: Secondary | ICD-10-CM | POA: Diagnosis not present

## 2021-06-08 DIAGNOSIS — M170 Bilateral primary osteoarthritis of knee: Secondary | ICD-10-CM | POA: Diagnosis not present

## 2021-06-08 DIAGNOSIS — M25562 Pain in left knee: Secondary | ICD-10-CM | POA: Diagnosis not present

## 2021-06-22 ENCOUNTER — Encounter: Payer: Self-pay | Admitting: Internal Medicine

## 2021-06-22 ENCOUNTER — Ambulatory Visit (INDEPENDENT_AMBULATORY_CARE_PROVIDER_SITE_OTHER): Payer: Medicare HMO | Admitting: Internal Medicine

## 2021-06-22 DIAGNOSIS — T39395A Adverse effect of other nonsteroidal anti-inflammatory drugs [NSAID], initial encounter: Secondary | ICD-10-CM

## 2021-06-22 DIAGNOSIS — K2960 Other gastritis without bleeding: Secondary | ICD-10-CM

## 2021-06-22 DIAGNOSIS — Z630 Problems in relationship with spouse or partner: Secondary | ICD-10-CM | POA: Diagnosis not present

## 2021-06-22 DIAGNOSIS — R69 Illness, unspecified: Secondary | ICD-10-CM | POA: Diagnosis not present

## 2021-06-22 MED ORDER — TRAMADOL HCL 50 MG PO TABS: 50.0000 mg | ORAL_TABLET | Freq: Every day | ORAL | 5 refills | Status: DC

## 2021-06-23 NOTE — Progress Notes (Signed)
? ?Subjective:  ?Patient ID: Brenda Leon, female    DOB: 07/31/1945  Age: 76 y.o. MRN: [id number] ? ?CC: Diagnoses of Gastritis due to nonsteroidal anti-inflammatory drug and Marital stress were pertinent to this visit. ? ? ?HPI ?Brenda Leon presents for  ?Chief Complaint  ?Patient presents with  ? Follow-up  ? ?1) follow up on gastritis. Improving with nsaid suspension and use of PPI.  Symptoms have resolved and she has an orthopedic appt on Monday ? ?2) Marital conflict:  met with her and husband this afternoon to discuss their conflict with each other.   ? ? ?Outpatient Medications Prior to Visit  ?Medication Sig Dispense Refill  ? acetaminophen (TYLENOL) 500 MG tablet Take 650 mg by mouth every 6 (six) hours as needed.    ? ALPRAZolam (XANAX) 0.25 MG tablet Take 1 tablet (0.25 mg total) by mouth at bedtime as needed for anxiety. 30 tablet 5  ? atorvastatin (LIPITOR) 20 MG tablet Take 1 tablet (20 mg total) by mouth daily. 90 tablet 0  ? b complex vitamins tablet Take 1 tablet by mouth daily.    ? Cholecalciferol (VITAMIN D3) 1000 units CAPS Take 2,000 Units by mouth daily. Take 4,000 units daily    ? Flaxseed, Linseed, (FLAX SEED OIL PO) Take by mouth.    ? Horse Chestnut 300 MG CAPS Take 1 capsule by mouth daily.    ? hydroxychloroquine (PLAQUENIL) 200 MG tablet Take 200 mg by mouth 2 (two) times daily.    ? MAGNESIUM GLUCONATE PO Take 1 tablet by mouth.    ? Multiple Vitamins-Minerals (PRESERVISION AREDS 2+MULTI VIT PO) Take 1 capsule by mouth.    ? Omeprazole 20 MG TBDD     ? XIIDRA 5 % SOLN     ? traMADol (ULTRAM) 50 MG tablet Take 50 mg by mouth as needed.    ? omeprazole (PRILOSEC) 40 MG capsule Take 1 capsule (40 mg total) by mouth daily. 30 capsule 2  ? Red Yeast Rice Extract 600 MG CAPS Take 1 capsule (600 mg total) by mouth daily. (Patient taking differently: Take 1 capsule by mouth 2 (two) times daily.) 60 capsule 0  ? ?No facility-administered medications prior to visit.  ? ? ?Review of  Systems; ? ?Patient denies headache, fevers, malaise, unintentional weight loss, skin rash, eye pain, sinus congestion and sinus pain, sore throat, dysphagia,  hemoptysis , cough, dyspnea, wheezing, chest pain, palpitations, orthopnea, edema, abdominal pain, nausea, melena, diarrhea, constipation, flank pain, dysuria, hematuria, urinary  Frequency, nocturia, numbness, tingling, seizures,  Focal weakness, Loss of consciousness,  Tremor, insomnia, depression, anxiety, and suicidal ideation.   ? ? ? ?Objective:  ?Ht 5' 7.5" (1.715 m)   Wt 209 lb 6.4 oz (95 kg)   BMI 32.31 kg/m?  ? ?BP Readings from Last 3 Encounters:  ?06/07/21 (!) 150/82  ?04/24/21 132/68  ?02/28/21 140/88  ? ? ?Wt Readings from Last 3 Encounters:  ?06/22/21 209 lb 6.4 oz (95 kg)  ?06/07/21 209 lb 6.4 oz (95 kg)  ?04/24/21 213 lb (96.6 kg)  ? ? ?General appearance: alert, cooperative and appears stated age ?Ears: normal TM's and external ear canals both ears ?Throat: lips, mucosa, and tongue normal; teeth and gums normal ?Neck: no adenopathy, no carotid bruit, supple, symmetrical, trachea midline and thyroid not enlarged, symmetric, no tenderness/mass/nodules ?Back: symmetric, no curvature. ROM normal. No CVA tenderness. ?Lungs: clear to auscultation bilaterally ?Heart: regular rate and rhythm, S1, S2 normal, no murmur, click, rub or  gallop ?Abdomen: soft, non-tender; bowel sounds normal; no masses,  no organomegaly ?Pulses: 2+ and symmetric ?Skin: Skin color, texture, turgor normal. No rashes or lesions ?Lymph nodes: Cervical, supraclavicular, and axillary nodes normal. ? ?Lab Results  ?Component Value Date  ? HGBA1C 6.0 06/27/2020  ? ? ?Lab Results  ?Component Value Date  ? CREATININE 0.75 04/24/2021  ? CREATININE 0.72 06/27/2020  ? CREATININE 0.51 01/22/2019  ? ? ?Lab Results  ?Component Value Date  ? WBC 5.9 04/24/2021  ? HGB 12.4 04/24/2021  ? HCT 37.0 04/24/2021  ? PLT 224.0 04/24/2021  ? GLUCOSE 77 04/24/2021  ? CHOL 207 (H) 06/27/2020  ?  TRIG 98.0 06/27/2020  ? HDL 48.60 06/27/2020  ? LDLDIRECT 167.8 02/14/2012  ? LDLCALC 139 (H) 06/27/2020  ? ALT 18 04/24/2021  ? AST 18 04/24/2021  ? NA 140 04/24/2021  ? K 3.9 04/24/2021  ? CL 104 04/24/2021  ? CREATININE 0.75 04/24/2021  ? BUN 23 04/24/2021  ? CO2 26 04/24/2021  ? TSH 2.55 06/27/2020  ? HGBA1C 6.0 06/27/2020  ? MICROALBUR <0.7 06/27/2020  ? ? ?US Abdomen Complete ? ?Result Date: 05/04/2021 ?CLINICAL DATA:  Abdominal pain and distention cough for several weeks EXAM: ABDOMEN ULTRASOUND COMPLETE COMPARISON:  None FINDINGS: Gallbladder: Normally distended without stones or wall thickening. No pericholecystic fluid or sonographic Murphy sign. Common bile duct: Diameter: 5 mm, normal Liver: Echogenic parenchyma, likely fatty infiltration though this can be seen with cirrhosis and certain infiltrative disorders. No focal hepatic mass or nodularity. Portal vein is patent on color Doppler imaging with normal direction of blood flow towards the liver. IVC: Normal appearance Pancreas: Normal appearance Spleen: Normal appearance, 8.2 cm length Right Kidney: Length: 12.2 cm. Normal cortical thickness and echogenicity. Cyst at mid kidney 19 x 21 x 19 mm, simple features. No additional mass or hydronephrosis. Left Kidney: Length: 13.6 cm. Normal cortical thickness and echogenicity. Small cyst mid kidney 8 x 7 x 7 mm, simple features. No additional mass or hydronephrosis Abdominal aorta: Normal caliber Other findings: No free fluid IMPRESSION: Probable fatty infiltration of liver as above. Small BILATERAL renal cysts as above. No acute abnormalities. Electronically Signed   By: Lavonia Dana M.D.   On: 05/04/2021 16:33  ? ? ?Assessment & Plan:  ? ?Problem List Items Addressed This Visit   ? ? Marital stress  ?  counselling given today to patient and husband.  A total of 32 minutes of face to face time was spent with patient more than half of which was spent in counselling about the need for increased communication,  compromise and companionship with her husband  ? ?  ?  ? Gastritis due to nonsteroidal anti-inflammatory drug  ?  Symptoms are improving iwht PPI use and NSAID suspension  ? ?  ?  ? ? ?Follow-up: No follow-ups on file. ? ? ?Crecencio Mc, MD ?

## 2021-06-23 NOTE — Assessment & Plan Note (Addendum)
counselling given today to patient and husband.  A total of 32 minutes of face to face time was spent with patient more than half of which was spent in counselling and encouraged her to engage n activities that improve their communication, compromise and companionship  ?

## 2021-06-23 NOTE — Assessment & Plan Note (Signed)
Symptoms are improving iwht PPI use and NSAID suspension  ?

## 2021-06-28 ENCOUNTER — Other Ambulatory Visit (INDEPENDENT_AMBULATORY_CARE_PROVIDER_SITE_OTHER): Payer: Medicare HMO

## 2021-06-28 DIAGNOSIS — R7301 Impaired fasting glucose: Secondary | ICD-10-CM | POA: Diagnosis not present

## 2021-06-28 DIAGNOSIS — Z79899 Other long term (current) drug therapy: Secondary | ICD-10-CM

## 2021-06-28 DIAGNOSIS — R7303 Prediabetes: Secondary | ICD-10-CM

## 2021-06-28 LAB — COMPREHENSIVE METABOLIC PANEL
ALT: 18 U/L (ref 0–35)
AST: 18 U/L (ref 0–37)
Albumin: 4.5 g/dL (ref 3.5–5.2)
Alkaline Phosphatase: 53 U/L (ref 39–117)
BUN: 20 mg/dL (ref 6–23)
CO2: 26 mEq/L (ref 19–32)
Calcium: 9.3 mg/dL (ref 8.4–10.5)
Chloride: 105 mEq/L (ref 96–112)
Creatinine, Ser: 0.8 mg/dL (ref 0.40–1.20)
GFR: 72.41 mL/min (ref >60.00)
Glucose, Bld: 99 mg/dL (ref 70–99)
Potassium: 4 mEq/L (ref 3.5–5.1)
Sodium: 139 mEq/L (ref 135–145)
Total Bilirubin: 0.4 mg/dL (ref 0.2–1.2)
Total Protein: 7.1 g/dL (ref 6.0–8.3)

## 2021-06-28 LAB — HEMOGLOBIN A1C: Hgb A1c MFr Bld: 5.9 % (ref 4.6–6.5)

## 2021-07-16 ENCOUNTER — Encounter: Payer: Self-pay | Admitting: Internal Medicine

## 2021-07-26 DIAGNOSIS — M1712 Unilateral primary osteoarthritis, left knee: Secondary | ICD-10-CM | POA: Diagnosis not present

## 2021-08-22 DIAGNOSIS — M1712 Unilateral primary osteoarthritis, left knee: Secondary | ICD-10-CM | POA: Diagnosis not present

## 2021-08-26 ENCOUNTER — Other Ambulatory Visit: Payer: Self-pay | Admitting: Internal Medicine

## 2021-08-29 DIAGNOSIS — M1712 Unilateral primary osteoarthritis, left knee: Secondary | ICD-10-CM | POA: Diagnosis not present

## 2021-09-07 DIAGNOSIS — M1712 Unilateral primary osteoarthritis, left knee: Secondary | ICD-10-CM | POA: Diagnosis not present

## 2021-09-19 ENCOUNTER — Telehealth (INDEPENDENT_AMBULATORY_CARE_PROVIDER_SITE_OTHER): Payer: Medicare HMO | Admitting: Internal Medicine

## 2021-09-19 ENCOUNTER — Ambulatory Visit (INDEPENDENT_AMBULATORY_CARE_PROVIDER_SITE_OTHER): Payer: Medicare HMO

## 2021-09-19 ENCOUNTER — Encounter: Payer: Self-pay | Admitting: Internal Medicine

## 2021-09-19 ENCOUNTER — Other Ambulatory Visit: Payer: Medicare HMO

## 2021-09-19 DIAGNOSIS — R059 Cough, unspecified: Secondary | ICD-10-CM | POA: Diagnosis not present

## 2021-09-19 DIAGNOSIS — R051 Acute cough: Secondary | ICD-10-CM

## 2021-09-19 DIAGNOSIS — I700 Atherosclerosis of aorta: Secondary | ICD-10-CM | POA: Insufficient documentation

## 2021-09-19 MED ORDER — AMOXICILLIN-POT CLAVULANATE 875-125 MG PO TABS: 1.0000 | ORAL_TABLET | Freq: Two times a day (BID) | ORAL | 0 refills | Status: DC

## 2021-09-19 NOTE — Progress Notes (Signed)
Telephone Note  I connected with Rosana Berger   on 09/19/21 at  8:20 AM EDT by telephone and verified that I am speaking with the correct person using two identifiers.  Location patient: [place] Location provider:work or home office Persons participating in the virtual visit: patient, provider  I discussed the limitations and requested verbal permission for telemedicine visit. The patient expressed understanding and agreed to proceed.   HPI: Acute telemedicine visit for : Cough and low grade fever 99.8 F nl temp 97.8 and +mucous with cough tried day/nyquil deep breathing will cause a cough no sob, no wheezing  Nasal congestion at back of throat.  Tested neg x 2 for covid   -Pertinent past medical history: see below -Pertinent medication allergies:No Known Allergies -COVID-19 vaccine status:  Immunization History  Administered Date(s) Administered   Fluad Quad(high Dose 65+) 12/18/2018, 02/28/2021   Influenza Split 12/11/2011   Influenza,inj,Quad PF,6+ Mos 02/23/2013, 01/05/2014, 11/03/2014, 10/19/2015   PFIZER(Purple Top)SARS-COV-2 Vaccination 04/17/2019, 05/11/2019, 12/06/2019, 09/07/2020   Pfizer Covid-19 Vaccine Bivalent Booster 6yr & up 01/20/2021   Pneumococcal Conjugate-13 10/19/2015   Pneumococcal Polysaccharide-23 02/10/2012   Tdap 04/12/2010, 07/17/2021   Zoster Recombinat (Shingrix) 03/06/2018, 07/17/2021   Zoster, Live 01/15/2013     ROS: See pertinent positives and negatives per HPI.  Past Medical History:  Diagnosis Date   Anemia    Arthritis    Cataracts, bilateral    Chicken pox    Diverticulitis    Elevated blood pressure reading    GERD (gastroesophageal reflux disease)    Lupus (HCC)    SLE (systemic lupus erythematosus) ([facility]    diagnosed with skin biopsy and serology   Thyroid disease     Past Surgical History:  Procedure Laterality Date   ABDOMINAL HYSTERECTOMY  1987   APPENDECTOMY  1952   BREAST CYST ASPIRATION Right 25 + yrs ago   neg    CATARACT EXTRACTION, BILATERAL Bilateral 1995, 1[place]  x2 1995 and [place]     STRABISMUS SURGERY Bilateral 09/12/2017   Procedure: BILATERAL STRABISMUS REPAIR;  Surgeon: YEveritt Amber MD;  [facility]  Service: Ophthalmology;  Laterality: Bilateral;   TONSILLECTOMY AND ADENOIDECTOMY       Current Outpatient Medications:    amoxicillin-clavulanate (AUGMENTIN) 875-125 MG tablet, Take 1 tablet by mouth 2 (two) times daily. With food, Disp: 14 tablet, Rfl: 0   acetaminophen (TYLENOL) 500 MG tablet, Take 650 mg by mouth every 6 (six) hours as needed., Disp: , Rfl:    ALPRAZolam (XANAX) 0.25 MG tablet, Take 1 tablet (0.25 mg total) by mouth at bedtime as needed for anxiety., Disp: 30 tablet, Rfl: 5   atorvastatin (LIPITOR) 20 MG tablet, Take 1 tablet by mouth once daily, Disp: 90 tablet, Rfl: 2   b complex vitamins tablet, Take 1 tablet by mouth daily., Disp: , Rfl:    Cholecalciferol (VITAMIN D3) 1000 units CAPS, Take 2,000 Units by mouth daily. Take 4,000 units daily, Disp: , Rfl:    Flaxseed, Linseed, (FLAX SEED OIL PO), Take by mouth., Disp: , Rfl:    Horse Chestnut 300 MG CAPS, Take 1 capsule by mouth daily., Disp: , Rfl:    hydroxychloroquine (PLAQUENIL) 200 MG tablet, Take 200 mg by mouth 2 (two) times daily., Disp: , Rfl:    MAGNESIUM GLUCONATE PO, Take 1 tablet by mouth., Disp: , Rfl:    Multiple Vitamins-Minerals (PRESERVISION AREDS 2+MULTI VIT  PO), Take 1 capsule by mouth., Disp: , Rfl:    Omeprazole 20 MG TBDD, , Disp: , Rfl:    traMADol (ULTRAM) 50 MG tablet, Take 1 tablet (50 mg total) by mouth daily with supper., Disp: 30 tablet, Rfl: 5   XIIDRA 5 % SOLN, , Disp: , Rfl:   EXAM:  VITALS per patient if applicable:  GENERAL: alert, oriented, appears well and in no acute distress   LUNGS: on inspection no signs of respiratory distress, breathing rate appears normal, no obvious gross SOB, gasping  or wheezing +cough on exam   PSYCH/NEURO: pleasant and cooperative, no obvious depression or anxiety, speech and thought processing grossly intact  ASSESSMENT AND PLAN:  Discussed the following assessment and plan:  Acute cough - Plan: DG Chest 2 View, amoxicillin-clavulanate (AUGMENTIN) 875-125 MG tablet, Will try mucinex dm or robitussin dm otc   -we discussed possible serious and likely etiologies, options for evaluation and workup, limitations of telemedicine visit vs in person visit, treatment, treatment risks and precautions. Pt is agreeable to treatment via telemedicine at this moment.    I discussed the assessment and treatment plan with the patient. The patient was provided an opportunity to ask questions and all were answered. The patient agreed with the plan and demonstrated an understanding of the instructions.    Time spent 11 min Delorise Jackson, MD

## 2021-09-25 ENCOUNTER — Ambulatory Visit (INDEPENDENT_AMBULATORY_CARE_PROVIDER_SITE_OTHER): Payer: Medicare HMO | Admitting: Internal Medicine

## 2021-09-25 ENCOUNTER — Encounter: Payer: Self-pay | Admitting: Internal Medicine

## 2021-09-25 VITALS — BP 142/70 | HR 84 | Temp 98.4°F | Ht 67.5 in | Wt 206.8 lb

## 2021-09-25 DIAGNOSIS — E785 Hyperlipidemia, unspecified: Secondary | ICD-10-CM | POA: Diagnosis not present

## 2021-09-25 DIAGNOSIS — R079 Chest pain, unspecified: Secondary | ICD-10-CM

## 2021-09-25 DIAGNOSIS — I700 Atherosclerosis of aorta: Secondary | ICD-10-CM | POA: Diagnosis not present

## 2021-09-25 DIAGNOSIS — M328 Other forms of systemic lupus erythematosus: Secondary | ICD-10-CM

## 2021-09-25 DIAGNOSIS — R0789 Other chest pain: Secondary | ICD-10-CM | POA: Insufficient documentation

## 2021-09-25 NOTE — Progress Notes (Signed)
Subjective:  Patient ID: Brenda Leon, female    DOB: [birth year]  Age: 76 y.o. MRN: [id number]  CC: The primary encounter diagnosis was Intermittent chest pain. Diagnoses of Other forms of systemic lupus erythematosus, unspecified organ involvement status ([place]), Aortic atherosclerosis ([place]), Hyperlipidemia with target LDL less than 130, and Atypical chest pain were also pertinent to this visit.   HPI.   Brenda Leon presents for evaluation of intermittent nonexertional left sided chest pain mentioned in a mychart message last week in the setting of discussion of aortic atherosclerosis.  Chief Complaint  Patient presents with   Acute Visit    Intermittent chest pain left side. Pain does not radiate into arm, neck, jaw, shoulder or back. No SOBr when the check pain occurs. Pt stated that it occurs at random times.    [name] is a 76 yr old female with aortic atherosclerosis noted on prior CT scan , SLE  diagnosed in 1996 with skin biopsy ,  presenting with fatigue and chronic joint pain without synovitis   She describes the pain as grabbing   left sided chest/breast,    lasting a minute or less,  occurring at rest .  She first experienced it in 1999,  attributed it to anxiety bc her evaluation by an urgent care doctor and given zoloft which she did not take.  Still under stress (marital and financial) .  Has had 2 episodes in the last 6 month .   Sister sees [facility] in [place]  for management of atrial fibrillation   Outpatient Medications Prior to Visit  Medication Sig Dispense Refill   acetaminophen (TYLENOL) 500 MG tablet Take 650 mg by mouth every 6 (six) hours as needed.     ALPRAZolam (XANAX) 0.25 MG tablet Take 1 tablet (0.25 mg total) by mouth at bedtime as needed for anxiety. 30 tablet 5   amoxicillin-clavulanate (AUGMENTIN) 875-125 MG tablet Take 1 tablet by mouth 2 (two) times daily. With food 14 tablet 0   atorvastatin (LIPITOR) 20 MG tablet Take 1 tablet by mouth once daily 90  tablet 2   b complex vitamins tablet Take 1 tablet by mouth daily.     Cholecalciferol (VITAMIN D3) 1000 units CAPS Take 2,000 Units by mouth daily. Take 4,000 units daily     Flaxseed, Linseed, (FLAX SEED OIL PO) Take by mouth.     Horse Chestnut 300 MG CAPS Take 1 capsule by mouth daily.     hydroxychloroquine (PLAQUENIL) 200 MG tablet Take 200 mg by mouth 2 (two) times daily.     MAGNESIUM GLUCONATE PO Take 1 tablet by mouth.     Multiple Vitamins-Minerals (PRESERVISION AREDS 2+MULTI VIT PO) Take 1 capsule by mouth.     Omeprazole 20 MG TBDD      traMADol (ULTRAM) 50 MG tablet Take 1 tablet (50 mg total) by mouth daily with supper. 30 tablet 5   XIIDRA 5 % SOLN      No facility-administered medications prior to visit.    Review of Systems;  Patient denies headache, fevers, malaise, unintentional weight loss, skin rash, eye pain, sinus congestion and sinus pain, sore throat, dysphagia,  hemoptysis , cough, dyspnea, wheezing, chest pain, palpitations, orthopnea, edema, abdominal pain, nausea, melena, diarrhea, constipation, flank pain, dysuria, hematuria, urinary  Frequency, nocturia, numbness, tingling, seizures,  Focal weakness, Loss of consciousness,  Tremor, insomnia, depression, anxiety, and suicidal ideation.      Objective:  BP (!) 142/70 (BP Location: Left Arm, Patient Position:  Sitting, Cuff Size: Large)   Pulse 84   Temp 98.4 F (36.9 C) (Oral)   Ht 5' 7.5" (1.715 m)   Wt 206 lb 12.8 oz (93.8 kg)   SpO2 94%   BMI 31.91 kg/m   BP Readings from Last 3 Encounters:  09/25/21 (!) 142/70  06/07/21 (!) 150/82  04/24/21 132/68    Wt Readings from Last 3 Encounters:  09/25/21 206 lb 12.8 oz (93.8 kg)  06/22/21 209 lb 6.4 oz (95 kg)  06/07/21 209 lb 6.4 oz (95 kg)    General appearance: alert, cooperative and appears stated age Ears: normal TM's and external ear canals both ears Throat: lips, mucosa, and tongue normal; teeth and gums normal Neck: no adenopathy, no  carotid bruit, supple, symmetrical, trachea midline and thyroid not enlarged, symmetric, no tenderness/mass/nodules Back: symmetric, no curvature. ROM normal. No CVA tenderness. Lungs: clear to auscultation bilaterally Heart: regular rate and rhythm, S1, S2 normal, no murmur, click, rub or gallop Abdomen: soft, non-tender; bowel sounds normal; no masses,  no organomegaly Pulses: 2+ and symmetric Skin: Skin color, texture, turgor normal. No rashes or lesions Lymph nodes: Cervical, supraclavicular, and axillary nodes normal.  Lab Results  Component Value Date   HGBA1C 5.9 06/28/2021   HGBA1C 6.0 06/27/2020    Lab Results  Component Value Date   CREATININE 0.80 06/28/2021   CREATININE 0.75 04/24/2021   CREATININE 0.72 06/27/2020    Lab Results  Component Value Date   WBC 5.9 04/24/2021   HGB 12.4 04/24/2021   HCT 37.0 04/24/2021   PLT 224.0 04/24/2021   GLUCOSE 99 06/28/2021   CHOL 207 (H) 06/27/2020   TRIG 98.0 06/27/2020   HDL 48.60 06/27/2020   LDLDIRECT 167.8 02/14/2012   LDLCALC 139 (H) 06/27/2020   ALT 18 06/28/2021   AST 18 06/28/2021   NA 139 06/28/2021   K 4.0 06/28/2021   CL 105 06/28/2021   CREATININE 0.80 06/28/2021   BUN 20 06/28/2021   CO2 26 06/28/2021   TSH 2.55 06/27/2020   HGBA1C 5.9 06/28/2021   MICROALBUR <0.7 06/27/2020    US Abdomen Complete  Result Date: 05/04/2021 CLINICAL DATA:  Abdominal pain and distention cough for several weeks EXAM: ABDOMEN ULTRASOUND COMPLETE COMPARISON:  None FINDINGS: Gallbladder: Normally distended without stones or wall thickening. No pericholecystic fluid or sonographic Murphy sign. Common bile duct: Diameter: 5 mm, normal Liver: Echogenic parenchyma, likely fatty infiltration though this can be seen with cirrhosis and certain infiltrative disorders. No focal hepatic mass or nodularity. Portal vein is patent on color Doppler imaging with normal direction of blood flow towards the liver. IVC: Normal appearance Pancreas:  Normal appearance Spleen: Normal appearance, 8.2 cm length Right Kidney: Length: 12.2 cm. Normal cortical thickness and echogenicity. Cyst at mid kidney 19 x 21 x 19 mm, simple features. No additional mass or hydronephrosis. Left Kidney: Length: 13.6 cm. Normal cortical thickness and echogenicity. Small cyst mid kidney 8 x 7 x 7 mm, simple features. No additional mass or hydronephrosis Abdominal aorta: Normal caliber Other findings: No free fluid IMPRESSION: Probable fatty infiltration of liver as above. Small BILATERAL renal cysts as above. No acute abnormalities. Electronically Signed   By: Lavonia Dana M.D.   On: 05/04/2021 16:33    Assessment & Plan:   Problem List Items Addressed This Visit     Aortic atherosclerosis ([place])    Incidental finding on CT angiogram done during ER evaluation  In Nov 2020 for COVID RELATED respiratory failure, again  seen on July 2023 chest x ray.  She has been taking statin therapy for the past 6 months .       Relevant Orders   Ambulatory referral to Cardiology   Atypical chest pain    Occurring since 1999.  She also has a murmur suggestive of AS or MR. She has deferred cardiology evaluation in the past but has 2 sisters with atrial fibrillation and has multiple CRF including aortic atherosclerosis.  Referral to [facility] cardiology advised and accepted.       Relevant Orders   Ambulatory referral to Cardiology   Hyperlipidemia with target LDL less than 130   Relevant Orders   Lipid Panel w/reflex Direct LDL   SLE (systemic lupus erythematosus) ([place])    Last app with Rheumatolgy reviewed  Posey Pronto,  Nunzio Cory /Clinic  Feb 2023) with no changes to regimen and stable disease noted.       Other Visit Diagnoses     Intermittent chest pain    -  Primary   Relevant Orders   EKG 12-Lead   Ambulatory referral to Cardiology       Follow-up: No follow-ups on file.   Crecencio Mc, MD

## 2021-09-25 NOTE — Assessment & Plan Note (Addendum)
Occurring since 1999.  She also has a murmur suggestive of AS or MR. She has deferred cardiology evaluation in the past but has 2 sisters with atrial fibrillation and has multiple CRF including aortic atherosclerosis.  Referral to [facility] cardiology advised and accepted. I have ordered and reviewed a 12 lead EKG and find that there are no acute changes,  patient is in sinus rhythm but appears to have left atrial enlargement .

## 2021-09-25 NOTE — Assessment & Plan Note (Signed)
Incidental finding on CT angiogram done during ER evaluation  In Nov 2020 for COVID RELATED respiratory failure, again seen on July 2023 chest x ray.  She has been taking statin therapy for the past 6 months .

## 2021-09-25 NOTE — Assessment & Plan Note (Signed)
Last app with Rheumatolgy reviewed  Brenda Leon,  Brenda Leon  Feb 2023) with no changes to regimen and stable disease noted.

## 2021-09-28 ENCOUNTER — Other Ambulatory Visit: Payer: Self-pay | Admitting: Internal Medicine

## 2021-10-04 ENCOUNTER — Encounter: Payer: Self-pay | Admitting: Cardiology

## 2021-10-04 ENCOUNTER — Other Ambulatory Visit
Admission: RE | Admit: 2021-10-04 | Discharge: 2021-10-04 | Disposition: A | Discharge status: Home / Self Care | Payer: Medicare HMO | Source: Ambulatory Visit | Attending: Cardiology | Admitting: Cardiology

## 2021-10-04 ENCOUNTER — Ambulatory Visit: Payer: Medicare HMO | Admitting: Cardiology

## 2021-10-04 VITALS — BP 136/64 | HR 89 | Ht 67.5 in | Wt 205.2 lb

## 2021-10-04 DIAGNOSIS — E7800 Pure hypercholesterolemia, unspecified: Secondary | ICD-10-CM | POA: Diagnosis not present

## 2021-10-04 DIAGNOSIS — R079 Chest pain, unspecified: Secondary | ICD-10-CM

## 2021-10-04 DIAGNOSIS — R072 Precordial pain: Secondary | ICD-10-CM | POA: Insufficient documentation

## 2021-10-04 DIAGNOSIS — E785 Hyperlipidemia, unspecified: Secondary | ICD-10-CM | POA: Diagnosis not present

## 2021-10-04 LAB — BASIC METABOLIC PANEL
Anion gap: 8 (ref 5–15)
BUN: 19 mg/dL (ref 8–23)
CO2: 28 mmol/L (ref 22–32)
Calcium: 9.8 mg/dL (ref 8.9–10.3)
Chloride: 106 mmol/L (ref 98–111)
Creatinine, Ser: 0.81 mg/dL (ref 0.44–1.00)
GFR, Estimated: >60 mL/min (ref >60)
Glucose, Bld: 100 mg/dL — ABNORMAL HIGH (ref 70–99)
Potassium: 4.2 mmol/L (ref 3.5–5.1)
Sodium: 142 mmol/L (ref 135–145)

## 2021-10-04 LAB — LIPID PANEL
Cholesterol: 148 mg/dL (ref 0–200)
HDL: 55 mg/dL (ref >40)
LDL Cholesterol: 75 mg/dL (ref 0–99)
Total CHOL/HDL Ratio: 2.7 RATIO
Triglycerides: 91 mg/dL (ref <150)
VLDL: 18 mg/dL (ref 0–40)

## 2021-10-04 LAB — LDL CHOLESTEROL, DIRECT: Direct LDL: 79 mg/dL (ref 0–99)

## 2021-10-04 MED ORDER — IVABRADINE HCL 5 MG PO TABS: 10.0000 mg | ORAL_TABLET | Freq: Once | ORAL | 0 refills | Status: AC

## 2021-10-04 MED ORDER — ASPIRIN 81 MG PO TBEC: 81.0000 mg | DELAYED_RELEASE_TABLET | Freq: Every day | ORAL | Status: DC

## 2021-10-04 MED ORDER — METOPROLOL TARTRATE 100 MG PO TABS: 100.0000 mg | ORAL_TABLET | Freq: Once | ORAL | 0 refills | Status: DC

## 2021-10-04 NOTE — Patient Instructions (Signed)
Medication Instructions:   Your physician has recommended you make the following change in your medication:   START Aspirin 81 mg daily (over-the-counter)  *If you need a refill on your cardiac medications before your next appointment, please call your pharmacy*   Lab Work:  BMET today  -  Please go to the [facility] at [facility] in at the Registration Desk: 1st desk to the right, past the screening table  If you have labs (blood work) drawn today and your tests are completely normal, you will receive your results only by: [place] (if you have MyChart) OR A paper copy in the mail If you have any lab test that is abnormal or we need to change your treatment, we will call you to review the results.   Testing/Procedures:  1) Your physician has requested that you have an echocardiogram. Echocardiography is a painless test that uses sound waves to create images of your heart. It provides your doctor with information about the size and shape of your heart and how well your heart's chambers and valves are working. This procedure takes approximately one hour. There are no restrictions for this procedure.  2) Your cardiac CT is scheduled Thursday 10/11/21 at 8:45 AM at the below location:  [facility], [place] [address] [phone]  Please arrive 15 mins early for check-in and test prep.  Please follow these instructions carefully (unless otherwise directed):   On the Night Before the Test: Be sure to Drink plenty of water. Do not consume any caffeinated/decaffeinated beverages or chocolate 12 hours prior to your test. Do not take any antihistamines 12 hours prior to your test.   On the Day of the Test: Drink plenty of water until 1 hour prior to the test. Do not eat any food 4 hours prior to the test. You may take your regular medications prior to the test.  Take BOTH metoprolol  (Lopressor) 100 mg AND Ivabradine 10 mg TWO hours prior to test. FEMALES- please wear underwire-free bra if available, avoid dresses & tight clothing       After the Test: Drink plenty of water. After receiving IV contrast, you may experience a mild flushed feeling. This is normal. On occasion, you may experience a mild rash up to 24 hours after the test. This is not dangerous. If this occurs, you can take Benadryl 25 mg and increase your fluid intake. If you experience trouble breathing, this can be serious. If it is severe call 911 IMMEDIATELY. If it is mild, please call our office. If you take any of these medications: Glipizide/Metformin, Avandament, Glucavance, please do not take 48 hours after completing test unless otherwise instructed.  We will call to schedule your test 2-4 weeks out understanding that some insurance companies will need an authorization prior to the service being performed.   For non-scheduling related questions, please contact the cardiac imaging nurse navigator should you have any questions/concerns: Marchia Bond, Cardiac Imaging Nurse Navigator Gordy Clement, Cardiac Imaging Nurse Navigator [facility] Direct Office Dial: [phone]   For scheduling needs, including cancellations and rescheduling, please call [place], [phone].    Follow-Up: At [facility], you and your health needs are our priority.  As part of our continuing mission to provide you with exceptional heart care, we have created designated Provider Care Teams.  These Care Teams include your primary Cardiologist (physician) and Advanced Practice Providers (APPs -  Physician Assistants and Nurse Practitioners) who all work together to provide you with the care you need, when you need it.  We recommend signing up for the patient portal called "MyChart".  Sign up information is provided on this After Visit Summary.  MyChart is used to connect with patients for  Virtual Visits (Telemedicine).  Patients are able to view lab/test results, encounter notes, upcoming appointments, etc.  Non-urgent messages can be sent to your provider as well.   To learn more about what you can do with MyChart, go to [email or web address].    Your next appointment:    Follow up after studies  The format for your next appointment:   In Person  Provider:   You may see Kate Sable, MD or one of the following Advanced Practice Providers on your designated Care Team:   Murray Hodgkins, NP Christell Faith, PA-C Cadence Kathlen Mody, PA-C{  Important Information About Sugar

## 2021-10-04 NOTE — Progress Notes (Signed)
Cardiology Office Note:    Date:  10/04/2021   ID:  Brenda Leon, DOB October 04, 1945, MRN [id number]  PCP:  Crecencio Mc, MD   [place] Providers Cardiologist:  Kate Sable, MD     Referring MD: Crecencio Mc, MD   Chief Complaint  Patient presents with   New Patient (Initial Visit)    Patient c/o random chest pain/squeezing that comes and goes. Medications reviewed by the patient verbally.     History of Present Illness:    PRAPTI GRUSSING is a 76 y.o. female with a hx of hyperlipidemia, former smoker x 20 years GERD, SLE who presents due to chest pain.  States having on and off chest pain for years now.  Symptoms of chest pain are not associated with exertion.  Describes pain as squeezing, occurring very randomly.  Father had a heart attack in his 29s.  Recently started on Lipitor 20 mg daily due to elevated cholesterol.  States having some stressors at home with her relationship with husband.  Past Medical History:  Diagnosis Date   Anemia    Arthritis    Cataracts, bilateral    Chicken pox    Diverticulitis    Elevated blood pressure reading    GERD (gastroesophageal reflux disease)    Lupus (HCC)    SLE (systemic lupus erythematosus) ([place])    diagnosed with skin biopsy and serology   Thyroid disease     Past Surgical History:  Procedure Laterality Date   ABDOMINAL HYSTERECTOMY  1987   APPENDECTOMY  1952   BREAST CYST ASPIRATION Right 25 + yrs ago   neg   CATARACT EXTRACTION, BILATERAL Bilateral 1995, [place]   x2 1995 and [place] ARTHROSCOPY W/ ACL RECONSTRUCTION     STRABISMUS SURGERY Bilateral 09/12/2017   Procedure: BILATERAL STRABISMUS REPAIR;  Surgeon: Everitt Amber, MD;  Location: [facility];  Service: Ophthalmology;  Laterality: Bilateral;   TONSILLECTOMY AND ADENOIDECTOMY      Current Medications: Current Meds  Medication Sig   acetaminophen (TYLENOL) 500 MG tablet Take 650 mg by mouth  every 6 (six) hours as needed.   ALPRAZolam (XANAX) 0.25 MG tablet TAKE 1 TABLET BY MOUTH AT BEDTIME AS NEEDED FOR ANXIETY   aspirin EC 81 MG tablet Take 1 tablet (81 mg total) by mouth daily. Swallow whole.   atorvastatin (LIPITOR) 20 MG tablet Take 1 tablet by mouth once daily   b complex vitamins tablet Take 1 tablet by mouth daily.   Cholecalciferol (VITAMIN D3) 1000 units CAPS Take 2,000 Units by mouth daily. Take 4,000 units daily   Flaxseed, Linseed, (FLAX SEED OIL PO) Take by mouth.   Horse Chestnut 300 MG CAPS Take 1 capsule by mouth daily.   hydroxychloroquine (PLAQUENIL) 200 MG tablet Take 200 mg by mouth 2 (two) times daily.   ivabradine (CORLANOR) 5 MG TABS tablet Take 2 tablets (10 mg total) by mouth once for 1 dose. Take TWO HOURS prior to CT.   MAGNESIUM GLUCONATE PO Take 1 tablet by mouth.   metoprolol tartrate (LOPRESSOR) 100 MG tablet Take 1 tablet (100 mg total) by mouth once for 1 dose. Take TWO hours prior to CT procedure   Multiple Vitamins-Minerals (PRESERVISION AREDS 2+MULTI VIT PO) Take 1 capsule by mouth.   Omeprazole 20 MG TBDD    traMADol (ULTRAM) 50 MG tablet Take 1 tablet (50 mg total) by mouth daily with supper.  XIIDRA 5 % SOLN      Allergies:   Patient has no known allergies.   Social History   Socioeconomic History   Marital status: Married    Spouse name: Not on file   Number of children: Not on file   Years of education: Not on file   Highest education level: Not on file  Occupational History   Not on file  Tobacco Use   Smoking status: Former    Types: Cigarettes    Quit date: 02/25/1986    Years since quitting: 35.6   Smokeless tobacco: Never  Vaping Use   Vaping Use: Never used  Substance and Sexual Activity   Alcohol use: Yes    Alcohol/week: 14.0 standard drinks of alcohol    Types: 14 Standard drinks or equivalent per week    Comment: 2 beers daily   Drug use: No   Sexual activity: Yes  Other Topics Concern   Not on file  Social  History Narrative   Not on file   Social Determinants of Health   Financial Resource Strain: Low Risk  (03/22/2021)   Overall Financial Resource Strain (CARDIA)    Difficulty of Paying Living Expenses: Not very hard  Food Insecurity: No Food Insecurity (03/22/2021)   Hunger Vital Sign    Worried About Running Out of Food in the Last Year: Never true    Ran Out of Food in the Last Year: Never true  Transportation Needs: No Transportation Needs (03/22/2021)   PRAPARE - Hydrologist (Medical): No    Lack of Transportation (Non-Medical): No  Physical Activity: Inactive (03/22/2021)   Exercise Vital Sign    Days of Exercise per Week: 0 days    Minutes of Exercise per Session: 0 min  Stress: Stress Concern Present (03/22/2021)   [place]    Feeling of Stress : To some extent  Social Connections: Moderately Isolated (03/22/2021)   Social Connection and Isolation Panel [NHANES]    Frequency of Communication with Friends and Family: Once a week    Frequency of Social Gatherings with Friends and Family: Once a week    Attends Religious Services: Never    Marine scientist or Organizations: Yes    Attends Music therapist: Not on file    Marital Status: Married     Family History: The patient's family history includes Aneurysm in her mother; Diabetes in her sister; Heart attack in her father; Heart disease in her father; Hyperlipidemia in her brother, sister, sister, and son; Hypertension in her father, sister, and son; Kidney disease in her sister; Other in her mother. There is no history of Breast cancer.  ROS:   Please see the history of present illness.     All other systems reviewed and are negative.  EKGs/Labs/Other Studies Reviewed:    The following studies were reviewed today:   EKG:  EKG is  ordered today.  The ekg ordered today demonstrates normal sinus rhythm,  possible left atrial enlargement  Recent Labs: 04/24/2021: Hemoglobin 12.4; Platelets 224.0 06/28/2021: ALT 18; BUN 20; Creatinine, Ser 0.80; Potassium 4.0; Sodium 139  Recent Lipid Panel    Component Value Date/Time   CHOL 207 (H) 06/27/2020 0745   TRIG 98.0 06/27/2020 0745   HDL 48.60 06/27/2020 0745   CHOLHDL 4 06/27/2020 0745   VLDL 19.6 06/27/2020 0745   LDLCALC 139 (H) 06/27/2020 0745   LDLDIRECT 167.8 02/14/2012  0913     Risk Assessment/Calculations:         Physical Exam:    VS:  BP 136/64 (BP Location: Right Arm, Patient Position: Sitting, Cuff Size: Large)   Pulse 89   Ht 5' 7.5" (1.715 m)   Wt 205 lb 4 oz (93.1 kg)   SpO2 98%   BMI 31.67 kg/m     Wt Readings from Last 3 Encounters:  10/04/21 205 lb 4 oz (93.1 kg)  09/25/21 206 lb 12.8 oz (93.8 kg)  06/22/21 209 lb 6.4 oz (95 kg)     GEN:  Well nourished, well developed in no acute distress HEENT: Normal NECK: No JVD; No carotid bruits CARDIAC: RRR, no murmurs, rubs, gallops RESPIRATORY:  Clear to auscultation without rales, wheezing or rhonchi  ABDOMEN: Soft, non-tender, non-distended MUSCULOSKELETAL:  No edema; No deformity  SKIN: Warm and dry NEUROLOGIC:  Alert and oriented x 3 PSYCHIATRIC:  Normal affect   ASSESSMENT:    1. Precordial pain   2. Pure hypercholesterolemia   3. Chest pain, unspecified type    PLAN:    In order of problems listed above:  Chest pain, risk factors hyperlipidemia, former smoker.  Get echocardiogram, get coronary CTA. Hyperlipidemia, continue Lipitor 20 mg, repeat fasting lipid profile planned in 2 months per PCP.  Follow-up after cardiac testing       Medication Adjustments/Labs and Tests Ordered: Current medicines are reviewed at length with the patient today.  Concerns regarding medicines are outlined above.  Orders Placed This Encounter  Procedures   CT CORONARY MORPH W/CTA COR W/SCORE W/CA W/CM &/OR WO/CM   Basic Metabolic Panel (BMET)   EKG 12-Lead    ECHOCARDIOGRAM COMPLETE   Meds ordered this encounter  Medications   metoprolol tartrate (LOPRESSOR) 100 MG tablet    Sig: Take 1 tablet (100 mg total) by mouth once for 1 dose. Take TWO hours prior to CT procedure    Dispense:  1 tablet    Refill:  0   ivabradine (CORLANOR) 5 MG TABS tablet    Sig: Take 2 tablets (10 mg total) by mouth once for 1 dose. Take TWO HOURS prior to CT.    Dispense:  2 tablet    Refill:  0   aspirin EC 81 MG tablet    Sig: Take 1 tablet (81 mg total) by mouth daily. Swallow whole.    Patient Instructions  Medication Instructions:   Your physician has recommended you make the following change in your medication:   START Aspirin 81 mg daily (over-the-counter)  *If you need a refill on your cardiac medications before your next appointment, please call your pharmacy*   Lab Work:  BMET today  -  Please go to the [facility] Entrance at [facility] in at the Registration Desk: 1st desk to the right, past the screening table  If you have labs (blood work) drawn today and your tests are completely normal, you will receive your results only by: [place] (if you have MyChart) OR A paper copy in the mail If you have any lab test that is abnormal or we need to change your treatment, we will call you to review the results.   Testing/Procedures:  1) Your physician has requested that you have an echocardiogram. Echocardiography is a painless test that uses sound waves to create images of your heart. It provides your doctor with information about the size and shape of your heart and how well your heart's  chambers and valves are working. This procedure takes approximately one hour. There are no restrictions for this procedure.  2) Your cardiac CT is scheduled Thursday 10/11/21 at 8:45 AM at the below location:  [facility] [place], [place] [address] [phone]  Please arrive 15 mins  early for check-in and test prep.  Please follow these instructions carefully (unless otherwise directed):   On the Night Before the Test: Be sure to Drink plenty of water. Do not consume any caffeinated/decaffeinated beverages or chocolate 12 hours prior to your test. Do not take any antihistamines 12 hours prior to your test.   On the Day of the Test: Drink plenty of water until 1 hour prior to the test. Do not eat any food 4 hours prior to the test. You may take your regular medications prior to the test.  Take BOTH metoprolol (Lopressor) 100 mg AND Ivabradine 10 mg TWO hours prior to test. FEMALES- please wear underwire-free bra if available, avoid dresses & tight clothing       After the Test: Drink plenty of water. After receiving IV contrast, you may experience a mild flushed feeling. This is normal. On occasion, you may experience a mild rash up to 24 hours after the test. This is not dangerous. If this occurs, you can take Benadryl 25 mg and increase your fluid intake. If you experience trouble breathing, this can be serious. If it is severe call 911 IMMEDIATELY. If it is mild, please call our office. If you take any of these medications: Glipizide/Metformin, Avandament, Glucavance, please do not take 48 hours after completing test unless otherwise instructed.  We will call to schedule your test 2-4 weeks out understanding that some insurance companies will need an authorization prior to the service being performed.   For non-scheduling related questions, please contact the cardiac imaging nurse navigator should you have any questions/concerns: Marchia Bond, Cardiac Imaging Nurse Navigator Gordy Clement, Cardiac Imaging Nurse Navigator [facility] Direct Office Dial: [phone]   For scheduling needs, including cancellations and rescheduling, please call [place], [phone].    Follow-Up: At [facility], you and your health needs are  our priority.  As part of our continuing mission to provide you with exceptional heart care, we have created designated Provider Care Teams.  These Care Teams include your primary Cardiologist (physician) and Advanced Practice Providers (APPs -  Physician Assistants and Nurse Practitioners) who all work together to provide you with the care you need, when you need it.  We recommend signing up for the patient portal called "MyChart".  Sign up information is provided on this After Visit Summary.  MyChart is used to connect with patients for Virtual Visits (Telemedicine).  Patients are able to view lab/test results, encounter notes, upcoming appointments, etc.  Non-urgent messages can be sent to your provider as well.   To learn more about what you can do with MyChart, go to [email or web address].    Your next appointment:    Follow up after studies  The format for your next appointment:   In Person  Provider:   You may see Kate Sable, MD or one of the following Advanced Practice Providers on your designated Care Team:   Murray Hodgkins, NP Christell Faith, PA-C Cadence Kathlen Mody, [place]  Important Information About Sugar         Signed, Kate Sable, MD  10/04/2021 12:14 PM    [place]

## 2021-10-09 ENCOUNTER — Telehealth (HOSPITAL_COMMUNITY): Payer: Self-pay | Admitting: *Deleted

## 2021-10-09 NOTE — Telephone Encounter (Signed)
Attempted to call patient regarding upcoming cardiac CT appointment. °Left message on voicemail with name and callback number ° °Jaeliana Lococo RN Navigator Cardiac Imaging °[facility] °[phone] Office °[phone] Cell ° °

## 2021-10-10 ENCOUNTER — Telehealth (HOSPITAL_COMMUNITY): Payer: Self-pay | Admitting: *Deleted

## 2021-10-10 NOTE — Telephone Encounter (Signed)
Reaching out to patient to offer assistance regarding upcoming cardiac imaging study; pt verbalizes understanding of appt date/time, parking situation and where to check in, pre-test NPO status and medications ordered, and verified current allergies; name and call back number provided for further questions should they arise  Zaylei Mullane RN Navigator Cardiac Imaging [facility] [phone] office [phone] cell  Patient to take 100mg metoprolol tartrate and 10mg ivabradine two hours prior to her cardiac CT scan. 

## 2021-10-11 ENCOUNTER — Ambulatory Visit
Admission: RE | Admit: 2021-10-11 | Discharge: 2021-10-11 | Disposition: A | Discharge status: Home / Self Care | Payer: Medicare HMO | Source: Ambulatory Visit | Attending: Cardiology | Admitting: Cardiology

## 2021-10-11 DIAGNOSIS — R072 Precordial pain: Secondary | ICD-10-CM | POA: Diagnosis present

## 2021-10-11 DIAGNOSIS — R079 Chest pain, unspecified: Secondary | ICD-10-CM | POA: Insufficient documentation

## 2021-10-11 MED ORDER — IOHEXOL 350 MG/ML SOLN
100.0000 mL | Freq: Once | INTRAVENOUS | Status: AC | PRN
  Administered 2021-10-11: 100 mL via INTRAVENOUS

## 2021-10-11 MED ORDER — NITROGLYCERIN 0.4 MG SL SUBL
0.8000 mg | SUBLINGUAL_TABLET | Freq: Once | SUBLINGUAL | Status: AC
  Administered 2021-10-11: 0.8 mg via SUBLINGUAL

## 2021-10-11 NOTE — Progress Notes (Signed)
Patient tolerated CT well. Drank water after. Vital signs stable encourage to drink water throughout day.Reasons explained and verbalized understanding. Ambulated steady gait.  

## 2021-10-14 ENCOUNTER — Other Ambulatory Visit: Payer: Self-pay | Admitting: Internal Medicine

## 2021-10-18 ENCOUNTER — Ambulatory Visit (INDEPENDENT_AMBULATORY_CARE_PROVIDER_SITE_OTHER): Payer: Medicare HMO

## 2021-10-18 DIAGNOSIS — R072 Precordial pain: Secondary | ICD-10-CM | POA: Diagnosis not present

## 2021-10-18 DIAGNOSIS — M3219 Other organ or system involvement in systemic lupus erythematosus: Secondary | ICD-10-CM | POA: Diagnosis not present

## 2021-10-18 DIAGNOSIS — M19041 Primary osteoarthritis, right hand: Secondary | ICD-10-CM | POA: Diagnosis not present

## 2021-10-18 DIAGNOSIS — R079 Chest pain, unspecified: Secondary | ICD-10-CM

## 2021-10-18 DIAGNOSIS — M19042 Primary osteoarthritis, left hand: Secondary | ICD-10-CM | POA: Diagnosis not present

## 2021-10-18 DIAGNOSIS — Z79899 Other long term (current) drug therapy: Secondary | ICD-10-CM | POA: Diagnosis not present

## 2021-10-18 LAB — ECHOCARDIOGRAM COMPLETE
AR max vel: 1.67 cm2
AV Area VTI: 1.93 cm2
AV Area mean vel: 1.97 cm2
AV Mean grad: 6 mmHg
AV Peak grad: 12.7 mmHg
Ao pk vel: 1.78 m/s
Area-P 1/2: 3.58 cm2
Calc EF: 52.1 %
P 1/2 time: 450 msec
S' Lateral: 3.2 cm
Single Plane A2C EF: 50.8 %
Single Plane A4C EF: 54.1 %

## 2021-10-24 NOTE — Progress Notes (Unsigned)
Cardiology Office Note:    Date:  10/25/2021   ID:  Brenda Leon, DOB 04-19-1945, MRN [id number]  PCP:  Crecencio Mc, MD   [place] Providers Cardiologist:  Kate Sable, MD     Referring MD: Crecencio Mc, MD   CC: Atypical chest pain  History of Present Illness:    Brenda Leon is a 76 y.o. female with a hx of the following:  Aortic atherosclerosis Thoracic aortic atherosclerosis Varicose veins of BLE GERD SLE GAD Functional systolic murmur Mild, non-obstructive CAD HLD  First presented to the office on 10/04/21 and was seen by Dr. Kate Sable with CC of squeezing, random chest pain, occurring intermittently X years. Denied it being associated with exertion. Father had MI in 56s. Did admit to some marital stress. Coronary CTA revealed coronary calcium score of 309, 79th percentile for her demographic, normal coronary origin with right dominance, calcified plaque causing mild stenosis (25-49%) in the mid LAD and RCA, minimal stenosis in the pLCx (<25%), and CAD-RADS 2, mild obstructive CAD, aortic atherosclerosis noted. 2D echo revealed LVEF 40-98%, grade 1 diastolic dysfunction, no RWMA, mild dilation to left atrial size, mild MR with moderate mitral annular calcification, mild AR with calcification presents, all other findings normal.  Today she presents for follow up. She states she had one episode of atypical CP since last OV - attributes this to stress. Describes this as nonexertional, squeezing, lasted a few seconds, was left-sided, and was mild in intensity. States she is under a great deal of stress recently with her job and marriage. Denies any domestic abuse, SI, or homicidal ideation. Wants to go over her most recent Echo findings. Says BP is mainly well controlled. Denies any shortness of breath, palpitations, syncope, presyncope, dizziness, lightheadedness, orthopnea, PND, acute bleeding, or claudication. Denies any other questions or  concerns today.   Past Medical History:  Diagnosis Date   Anemia    Arthritis    Cataracts, bilateral    Chicken pox    Diverticulitis    Elevated blood pressure reading    GERD (gastroesophageal reflux disease)    Lupus (HCC)    SLE (systemic lupus erythematosus) ([place])    diagnosed with skin biopsy and serology   Thyroid disease     Past Surgical History:  Procedure Laterality Date   ABDOMINAL HYSTERECTOMY  1987   APPENDECTOMY  1952   BREAST CYST ASPIRATION Right 25 + yrs ago   neg   CATARACT EXTRACTION, BILATERAL Bilateral 1995, [place]   x2 1995 and [place] ARTHROSCOPY W/ ACL RECONSTRUCTION     STRABISMUS SURGERY Bilateral 09/12/2017   Procedure: BILATERAL STRABISMUS REPAIR;  Surgeon: Everitt Amber, MD;  Location: [facility];  Service: Ophthalmology;  Laterality: Bilateral;   TONSILLECTOMY AND ADENOIDECTOMY      Current Medications: Current Meds  Medication Sig   acetaminophen (TYLENOL) 500 MG tablet Take 650 mg by mouth every 6 (six) hours as needed.   ALPRAZolam (XANAX) 0.25 MG tablet TAKE 1 TABLET BY MOUTH AT BEDTIME AS NEEDED FOR ANXIETY (Patient taking differently: Take 0.25 mg by mouth at bedtime as needed for anxiety or sleep.)   aspirin EC 81 MG tablet Take 1 tablet (81 mg total) by mouth daily. Swallow whole.   atorvastatin (LIPITOR) 20 MG tablet Take 1 tablet by mouth once daily (Patient taking differently: Take 20 mg by mouth daily.)   b complex vitamins tablet Take 1  tablet by mouth daily.   Cholecalciferol (VITAMIN D3) 1000 units CAPS Take 2,000 Units by mouth daily. Take 4,000 units daily   Flaxseed, Linseed, (FLAX SEED OIL PO) Take 1 tablet by mouth daily.   Horse Chestnut 300 MG CAPS Take 1 capsule by mouth daily.   hydroxychloroquine (PLAQUENIL) 200 MG tablet Take 200 mg by mouth 2 (two) times daily.   MAGNESIUM GLUCONATE PO Take 1 tablet by mouth daily.   Multiple Vitamins-Minerals (PRESERVISION AREDS 2+MULTI VIT  PO) Take 1 capsule by mouth daily.   Omeprazole 20 MG TBDD Take 1 tablet by mouth daily.   traMADol (ULTRAM) 50 MG tablet Take 1 tablet (50 mg total) by mouth daily with supper. (Patient taking differently: Take 50 mg by mouth as needed for moderate pain or severe pain.)   XIIDRA 5 % SOLN Apply 1 application  topically daily.   [DISCONTINUED] metoprolol tartrate (LOPRESSOR) 100 MG tablet Take 1 tablet (100 mg total) by mouth once for 1 dose. Take TWO hours prior to CT procedure     Allergies:   Patient has no known allergies.   Social History   Socioeconomic History   Marital status: Married    Spouse name: Not on file   Number of children: Not on file   Years of education: Not on file   Highest education level: Not on file  Occupational History   Not on file  Tobacco Use   Smoking status: Former    Types: Cigarettes    Quit date: 02/25/1986    Years since quitting: 35.6   Smokeless tobacco: Never  Vaping Use   Vaping Use: Never used  Substance and Sexual Activity   Alcohol use: Yes    Alcohol/week: 14.0 standard drinks of alcohol    Types: 14 Standard drinks or equivalent per week    Comment: 2 beers daily   Drug use: No   Sexual activity: Yes  Other Topics Concern   Not on file  Social History Narrative   Not on file   Social Determinants of Health   Financial Resource Strain: Low Risk  (03/22/2021)   Overall Financial Resource Strain (CARDIA)    Difficulty of Paying Living Expenses: Not very hard  Food Insecurity: No Food Insecurity (03/22/2021)   Hunger Vital Sign    Worried About Running Out of Food in the Last Year: Never true    Ran Out of Food in the Last Year: Never true  Transportation Needs: No Transportation Needs (03/22/2021)   PRAPARE - Hydrologist (Medical): No    Lack of Transportation (Non-Medical): No  Physical Activity: Inactive (03/22/2021)   Exercise Vital Sign    Days of Exercise per Week: 0 days    Minutes of  Exercise per Session: 0 min  Stress: Stress Concern Present (03/22/2021)   [place]    Feeling of Stress : To some extent  Social Connections: Moderately Isolated (03/22/2021)   Social Connection and Isolation Panel [NHANES]    Frequency of Communication with Friends and Family: Once a week    Frequency of Social Gatherings with Friends and Family: Once a week    Attends Religious Services: Never    Marine scientist or Organizations: Yes    Attends Music therapist: Not on file    Marital Status: Married     Family History: The patient's family history includes Aneurysm in her mother; Diabetes in her  sister; Heart attack in her father; Heart disease in her father; Hyperlipidemia in her brother, sister, sister, and son; Hypertension in her father, sister, and son; Kidney disease in her sister; Other in her mother. There is no history of Breast cancer.  ROS:   Review of Systems  Constitutional: Negative.   HENT: Negative.    Eyes: Negative.   Respiratory: Negative.    Cardiovascular:  Positive for chest pain. Negative for palpitations, orthopnea, claudication, leg swelling and PND.       See HPI.  Gastrointestinal: Negative.   Genitourinary: Negative.   Musculoskeletal:  Positive for joint pain. Negative for back pain, falls, myalgias and neck pain.  Skin: Negative.   Neurological: Negative.   Endo/Heme/Allergies: Negative.   Psychiatric/Behavioral:  Negative for depression, hallucinations, memory loss, substance abuse and suicidal ideas. The patient is not nervous/anxious and does not have insomnia.        Significant for stress. See HPI.     Please see the history of present illness.    All other systems reviewed and are negative.  EKGs/Labs/Other Studies Reviewed:    The following studies were reviewed today:   EKG:  EKG is ordered today.  The ekg ordered today demonstrates NSR, 75 bpm,  otherwise normal EKG.    2D Echocardiogram on 10/18/2021: 1. Left ventricular ejection fraction, by estimation, is 60 to 65%. The  left ventricle has normal function. The left ventricle has no regional  wall motion abnormalities. Left ventricular diastolic parameters are  consistent with Grade I diastolic  dysfunction (impaired relaxation). The average left ventricular global  longitudinal strain is -18.5 %.   2. Right ventricular systolic function is normal. The right ventricular  size is normal. Tricuspid regurgitation signal is inadequate for assessing  PA pressure.   3. Left atrial size was mildly dilated.   4. The mitral valve is normal in structure. Mild mitral valve  regurgitation. No evidence of mitral stenosis. Moderate mitral annular  calcification.   5. The aortic valve is normal in structure. Aortic valve regurgitation is  mild. Aortic valve sclerosis/calcification is present, without any  evidence of aortic stenosis.   6. The inferior vena cava is normal in size with greater than 50%  respiratory variability, suggesting right atrial pressure of 3 mmHg.    Coronary CTA on 10/11/2021: 1. Coronary calcium score of 309. This was 79th percentile for age and sex matched control.   2. Normal coronary origin with right dominance.   3. Calcified plaque causing mild stenosis (25-49%) in the mid LAD and RCA.   4. Minimal stenosis in the proximal LCx (<25%).   5. CAD-RADS 2. Mild non-obstructive CAD (25-49%). Consider non-atherosclerotic causes of chest pain. Consider preventive therapy and risk factor modification.  6. Aortic atherosclerosis   Recent Labs: 04/24/2021: Hemoglobin 12.4; Platelets 224.0 06/28/2021: ALT 18 10/04/2021: BUN 19; Creatinine, Ser 0.81; Potassium 4.2; Sodium 142  Recent Lipid Panel    Component Value Date/Time   CHOL 148 10/04/2021 1234   TRIG 91 10/04/2021 1234   HDL 55 10/04/2021 1234   CHOLHDL 2.7 10/04/2021 1234   VLDL 18 10/04/2021 1234    LDLCALC 75 10/04/2021 1234   LDLDIRECT 79 10/04/2021 1234     Risk Assessment/Calculations:     The 10-year ASCVD risk score (Arnett DK, et al., 2019) is: 23.5%   Values used to calculate the score:     Age: 33 years     Sex: Female     Is Non-Hispanic African  American: No     Diabetic: No     Tobacco smoker: No     Systolic Blood Pressure: 657 mmHg     Is BP treated: Yes     HDL Cholesterol: 55 mg/dL     Total Cholesterol: 148 mg/dL   Physical Exam:    VS:  BP 138/74 (BP Location: Left Arm)   Pulse 72   Ht 5' 7.5" (1.715 m)   Wt 204 lb 12.8 oz (92.9 kg)   SpO2 97%   BMI 31.60 kg/m     Wt Readings from Last 3 Encounters:  10/25/21 204 lb 12.8 oz (92.9 kg)  10/04/21 205 lb 4 oz (93.1 kg)  09/25/21 206 lb 12.8 oz (93.8 kg)     GEN: Well nourished, well developed in no acute distress HEENT: Normal NECK: No JVD; No carotid bruits CARDIAC: RRR, Grade 2/6 murmur noted at LSB, no rubs or gallops noted; 2+ peripheral pulses throughout, strong and equal bilaterally RESPIRATORY:  Clear to auscultation without rales, wheezing or rhonchi  ABDOMEN: Soft, non-tender, non-distended MUSCULOSKELETAL:  Trace, minimal dependent edema along ankles; otherwise normal; No deformity  SKIN: Warm and dry NEUROLOGIC:  Alert and oriented x 3 PSYCHIATRIC:  Normal affect   ASSESSMENT:    1. Chest pain, unspecified type   2. Murmur   3. Aortic atherosclerosis ([place])   4. Coronary artery disease involving native heart without angina pectoris, unspecified vessel or lesion type   5. Hyperlipidemia LDL goal <70    PLAN:    In order of problems listed above:  Atypical chest pain - recent, stable Etiology unknown. Most likely due to stress and not ischemic in nature, as 12 lead EKG did not reveal any ischemic changes, chest pain is atypical in presentation and CP described as nonexertional and lasted for a few seconds, and she admits to significant stressors in her life. Will refer her to SW  for resources to relieve stress. Pt politely declines a daily antianginal or blood pressure medication at this time.     2. CAD, HLD with LDL goal < 70, Aortic atherosclerosis - chronic, stable Coronary CTA on 10/11/2021 revealed coronary calcium score of 309, calcified plaque causing mild stenosis in the mid LAD and RCA, minimal stenosis in the pLCx. CAD-RADS 2, mild obstructive CAD, aortic atherosclerosis . Recommended to consider preventative therapy and risk factor modification. Initiate NG SL PRN for chest pain, refuses any daily antianginal medications. Refer pt to SW as mentioned above. Discussed ED precautions and she verbalizes understanding.   3. Murmur - chronic, stable Asymptomatic. Grade 2/6 systolic murmur noted along LSB. Echo revealed mild MR and AR with moderate mitral annular calcification and calcification of aortic valve. Plan to update 2D echo in 3-5 years or earlier if new or worsening symptoms arise.   4. Disposition: F/U in 3 months with APP or sooner if anything changes.    Medication Adjustments/Labs and Tests Ordered: Current medicines are reviewed at length with the patient today.  Concerns regarding medicines are outlined above.  Orders Placed This Encounter  Procedures   EKG 12-Lead   Meds ordered this encounter  Medications   nitroGLYCERIN (NITROSTAT) 0.4 MG SL tablet    Sig: Place 1 tablet (0.4 mg total) under the tongue every 5 (five) minutes as needed for chest pain.    Dispense:  25 tablet    Refill:  0    Patient Instructions  Medication Instructions:   Your physician recommends that you continue on  your current medications as directed. Please refer to the Current Medication list given to you today.  *If you need a refill on your cardiac medications before your next appointment, please call your pharmacy*   Lab Work:  NONE  If you have labs (blood work) drawn today and your tests are completely normal, you will receive your results only  by: [place] (if you have MyChart) OR A paper copy in the mail If you have any lab test that is abnormal or we need to change your treatment, we will call you to review the results.   Testing/Procedures:  NONE   Follow-Up: At [facility], you and your health needs are our priority.  As part of our continuing mission to provide you with exceptional heart care, we have created designated Provider Care Teams.  These Care Teams include your primary Cardiologist (physician) and Advanced Practice Providers (APPs -  Physician Assistants and Nurse Practitioners) who all work together to provide you with the care you need, when you need it.  We recommend signing up for the patient portal called "MyChart".  Sign up information is provided on this After Visit Summary.  MyChart is used to connect with patients for Virtual Visits (Telemedicine).  Patients are able to view lab/test results, encounter notes, upcoming appointments, etc.  Non-urgent messages can be sent to your provider as well.   To learn more about what you can do with MyChart, go to [email or web address].    Your next appointment:   3 month(s)  The format for your next appointment:   In Person  Provider:   You may see Kate Sable, MD or one of the following Advanced Practice Providers on your designated Care Team:   Murray Hodgkins, NP Christell Faith, PA-C Cadence Kathlen Mody, PA-C Gerrie Nordmann, NP   Other Instructions  Please check your blood pressures daily at home and keep a blood pressure log.  If your Blood pressure systolic ( Top number) is running greater than >130 please reach out to our office by My chart or give [place] a call [phone].  *Ref. For social worker will be forwarded and their office will be in touch with you.  Important Information About Sugar         Signed, Finis Bud, NP  10/25/2021 12:00 PM    [facility]

## 2021-10-25 ENCOUNTER — Encounter: Payer: Self-pay | Admitting: Nurse Practitioner

## 2021-10-25 ENCOUNTER — Ambulatory Visit: Payer: Medicare HMO | Attending: Nurse Practitioner | Admitting: Nurse Practitioner

## 2021-10-25 VITALS — BP 138/74 | HR 72 | Ht 67.5 in | Wt 204.8 lb

## 2021-10-25 DIAGNOSIS — R011 Cardiac murmur, unspecified: Secondary | ICD-10-CM | POA: Diagnosis not present

## 2021-10-25 DIAGNOSIS — E785 Hyperlipidemia, unspecified: Secondary | ICD-10-CM | POA: Diagnosis not present

## 2021-10-25 DIAGNOSIS — M1712 Unilateral primary osteoarthritis, left knee: Secondary | ICD-10-CM | POA: Diagnosis not present

## 2021-10-25 DIAGNOSIS — R0789 Other chest pain: Secondary | ICD-10-CM | POA: Diagnosis not present

## 2021-10-25 DIAGNOSIS — I2510 Atherosclerotic heart disease of native coronary artery without angina pectoris: Secondary | ICD-10-CM

## 2021-10-25 DIAGNOSIS — I700 Atherosclerosis of aorta: Secondary | ICD-10-CM

## 2021-10-25 MED ORDER — NITROGLYCERIN 0.4 MG SL SUBL: 0.4000 mg | SUBLINGUAL_TABLET | SUBLINGUAL | 0 refills | Status: DC | PRN

## 2021-10-25 NOTE — Patient Instructions (Addendum)
Medication Instructions:   Your physician recommends that you continue on your current medications as directed. Please refer to the Current Medication list given to you today.  *If you need a refill on your cardiac medications before your next appointment, please call your pharmacy*   Lab Work:  NONE  If you have labs (blood work) drawn today and your tests are completely normal, you will receive your results only by: [place] (if you have MyChart) OR A paper copy in the mail If you have any lab test that is abnormal or we need to change your treatment, we will call you to review the results.   Testing/Procedures:  NONE   Follow-Up: At [facility], you and your health needs are our priority.  As part of our continuing mission to provide you with exceptional heart care, we have created designated Provider Care Teams.  These Care Teams include your primary Cardiologist (physician) and Advanced Practice Providers (APPs -  Physician Assistants and Nurse Practitioners) who all work together to provide you with the care you need, when you need it.  We recommend signing up for the patient portal called "MyChart".  Sign up information is provided on this After Visit Summary.  MyChart is used to connect with patients for Virtual Visits (Telemedicine).  Patients are able to view lab/test results, encounter notes, upcoming appointments, etc.  Non-urgent messages can be sent to your provider as well.   To learn more about what you can do with MyChart, go to [email or web address].    Your next appointment:   3 month(s)  The format for your next appointment:   In Person  Provider:   You may see Kate Sable, MD or one of the following Advanced Practice Providers on your designated Care Team:   Murray Hodgkins, NP Christell Faith, PA-C Cadence Kathlen Mody, PA-C Gerrie Nordmann, NP   Other Instructions  Please check your blood pressures daily at home and keep a blood pressure  log.  If your Blood pressure systolic ( Top number) is running greater than >130 please reach out to our office by My chart or give [place] a call [phone].  *Ref. For social worker will be forwarded and their office will be in touch with you.  Important Information About Sugar

## 2021-10-26 ENCOUNTER — Telehealth: Payer: Self-pay

## 2021-10-26 ENCOUNTER — Telehealth (HOSPITAL_COMMUNITY): Payer: Self-pay | Admitting: Licensed Clinical Social Worker

## 2021-10-26 DIAGNOSIS — I2510 Atherosclerotic heart disease of native coronary artery without angina pectoris: Secondary | ICD-10-CM

## 2021-10-26 NOTE — Progress Notes (Signed)
Heart and Vascular Care Navigation  10/26/2021  Brenda Leon Apr 16, 1945 [id number]  Reason for Referral: stress management   Engaged with patient by telephone for initial visit for Heart and Vascular Care Coordination.                                                                                                   Assessment:   CSW called pt to discuss above concerns.  Spouse present in home so did not want to elaborate on current stressors but states that husband has a substance use problem and this is the main source of her stress at this time.  Has looked into counseling before but would have a $40 copay which she does not feel is affordable.  CSW sent pt list of local Al-Anon support group meetings which would be free.  Also sent her brochure for [facility] who offer financial assistance on a sliding scale and provides individual counseling.  Pt also agreeable to CSW placing a [facility] consult to see if they can help further accessing mental health resources or provide telephonic counseling services for pt.                                   Social History:                                                                             SDOH Screenings   Alcohol Screen: Low Risk  (03/22/2021)   Alcohol Screen    Last Alcohol Screening Score (AUDIT): 0  Depression (PHQ2-9): Low Risk  (09/25/2021)   Depression (PHQ2-9)    PHQ-2 Score: 4  Financial Resource Strain: Low Risk  (03/22/2021)   Overall Financial Resource Strain (CARDIA)    Difficulty of Paying Living Expenses: Not very hard  Food Insecurity: No Food Insecurity (03/22/2021)   Hunger Vital Sign    Worried About Running Out of Food in the Last Year: Never true    Ran Out of Food in the Last Year: Never true  Housing: Low Risk  (03/22/2021)   Housing    Last Housing Risk Score: 0  Physical Activity: Inactive (03/22/2021)   Exercise Vital Sign    Days of Exercise per Week: 0 days    Minutes of Exercise per Session:  0 min  Social Connections: Moderately Isolated (03/22/2021)   Social Connection and Isolation Panel [NHANES]    Frequency of Communication with Friends and Family: Once a week    Frequency of Social Gatherings with Friends and Family: Once a week    Attends Religious Services: Never    Marine scientist or Organizations: Yes    Attends Archivist Meetings: Not on file  Marital Status: Married  Stress: Stress Concern Present (03/22/2021)   [place]    Feeling of Stress : To some extent  Tobacco Use: Medium Risk (10/25/2021)   Patient History    Smoking Tobacco Use: Former    Smokeless Tobacco Use: Never    Passive Exposure: Not on file  Transportation Needs: No Transportation Needs (03/22/2021)   PRAPARE - Hydrologist (Medical): No    Lack of Transportation (Non-Medical): No   Jorge Ny, LCSW Clinical Social Worker [facility] Desk#: [phone] Cell#: [phone]

## 2021-10-26 NOTE — Telephone Encounter (Signed)
   Telephone encounter was:  Successful.  10/26/2021 Name: SHYTERIA LEWIS MRN: [id number] DOB: [birth year]  DEMA TIMMONS is a 76 y.o. year old female who is a primary care patient of Derrel Nip, Aris Everts, MD . The community resource team was consulted for assistance with  Family Counseling  Care guide performed the following interventions: Patient provided with information about care guide support team and interviewed to confirm resource needs. Consent provided for [facility] Referral. Patient advised she is interested in [facility]. CG sent resources to patient's e-mail per request. Resources have been received. CG provided resources for: -Elder Support/[place] [facility] Support -Always in [place] for [facility]   Follow Up Plan:  No further follow up planned at this time. The patient has been provided with needed resources.  [place] management  [place], [place]  Main Phone: [phone]  E-mail: [email or web address]  Website: [email or web address]

## 2021-10-31 ENCOUNTER — Telehealth: Payer: Self-pay

## 2021-10-31 NOTE — Chronic Care Management (AMB) (Signed)
  Care Coordination  Outreach Note  10/31/2021 Name: RAGAN DUHON MRN: [id number] DOB: [birth year]   Care Coordination Outreach Attempts: An unsuccessful telephone outreach was attempted today to offer the patient information about available care coordination services as a benefit of their health plan.   Follow Up Plan:  Additional outreach attempts will be made to offer the patient care coordination information and services.   Encounter Outcome:  No Answer  Sig Noreene Larsson, [place],  [address] Direct Dial: [phone] [email or web address]

## 2021-11-05 NOTE — Chronic Care Management (AMB) (Signed)
  Care Coordination  Outreach Note  11/05/2021 Name: Brenda Leon MRN: [id number] DOB: 01/26/1946   Care Coordination Outreach Attempts: A second unsuccessful outreach was attempted today to offer the patient with information about available care coordination services as a benefit of their health plan.     Follow Up Plan:  Additional outreach attempts will be made to offer the patient care coordination information and services.   Encounter Outcome:  No Answer  Noreene Larsson, [place], [place] [address] Direct Dial: [phone] [email or web address]

## 2021-11-06 ENCOUNTER — Telehealth: Payer: Self-pay

## 2021-11-06 NOTE — Progress Notes (Signed)
error 

## 2021-11-13 NOTE — Chronic Care Management (AMB) (Signed)
  Care Coordination   Note   11/13/2021 Name: [name] MRN: [id number] DOB: 01-Feb-1946  Brenda Leon is a 76 y.o. year old female who sees Derrel Nip, Aris Everts, MD for primary care. I reached out to [name] by phone today to offer care coordination services.  Ms. Gaskins was given information about Care Coordination services today including:   The Care Coordination services include support from the care team which includes your Nurse Coordinator, Clinical Social Worker, or Pharmacist.  The Care Coordination team is here to help remove barriers to the health concerns and goals most important to you. Care Coordination services are voluntary, and the patient may decline or stop services at any time by request to their care team member.   Care Coordination Consent Status: Patient did not agree to participate in care coordination services at this time.  Follow up plan:  Patient states that she is already involved with LCSW through Cardiology   Encounter Outcome:  Pt. Refused  Noreene Larsson, [place], [place] [address] Direct Dial: [phone] [email or web address]

## 2021-11-29 DIAGNOSIS — M1712 Unilateral primary osteoarthritis, left knee: Secondary | ICD-10-CM | POA: Diagnosis not present

## 2021-12-21 ENCOUNTER — Other Ambulatory Visit: Payer: Medicare HMO

## 2021-12-21 ENCOUNTER — Ambulatory Visit: Payer: Medicare HMO | Admitting: Internal Medicine

## 2021-12-24 ENCOUNTER — Encounter (INDEPENDENT_AMBULATORY_CARE_PROVIDER_SITE_OTHER): Payer: Self-pay

## 2022-01-04 DIAGNOSIS — H04123 Dry eye syndrome of bilateral lacrimal glands: Secondary | ICD-10-CM | POA: Diagnosis not present

## 2022-01-04 DIAGNOSIS — Z79899 Other long term (current) drug therapy: Secondary | ICD-10-CM | POA: Diagnosis not present

## 2022-01-10 ENCOUNTER — Other Ambulatory Visit: Payer: Self-pay | Admitting: Internal Medicine

## 2022-01-11 NOTE — Telephone Encounter (Signed)
Refilled: 06/22/2021 Last OV: 09/25/2021 Next OV: not scheduled

## 2022-01-14 ENCOUNTER — Telehealth: Payer: Self-pay | Admitting: Cardiology

## 2022-01-14 NOTE — Telephone Encounter (Signed)
   Name: BRONWEN PENDERGRAFT  DOB: 06/18/1945  MRN: [id number]  Primary Cardiologist: Kate Sable, MD   Preoperative team, please contact this patient and set up a phone call appointment for further preoperative risk assessment. Please obtain consent and complete medication review. Thank you for your help.  I confirm that guidance regarding antiplatelet and oral anticoagulation therapy has been completed and, if necessary, noted below.  No request for anticoagulation hold.   Deberah Pelton, NP 01/14/2022, 10:48 AM [place]

## 2022-01-14 NOTE — Telephone Encounter (Signed)
   Pre-operative Risk Assessment    Patient Name: Brenda Leon  DOB: 1946/01/14 MRN: [id number]      Request for Surgical Clearance    Procedure:   LT PATIEAL KNEEARTHROPLASTY  Date of Surgery:  Clearance 04/17/22                               Surgeon:  DR Gaynelle Arabian Surgeon's Group or Practice Name:  [facility] Phone number:  [phone] Fax number:  [phone]  Type of Clearance Requested:   - Medical    Type of Anesthesia:   CHOICE ANESTHESIA  Additional requests/questions:    [name]   01/14/2022, 9:56 AM

## 2022-01-15 NOTE — Telephone Encounter (Signed)
Per pre op provider today Coletta Memos, FNP asked to set pt up with tele appt. Pt does have an appt with Dr. Charlestine Night 01/29/22. I will update the appt notes for appt 01/29/22 for pre op clearance, with the hopes MD may clear the pt.

## 2022-01-29 ENCOUNTER — Encounter: Payer: Self-pay | Admitting: Cardiology

## 2022-01-29 ENCOUNTER — Ambulatory Visit: Payer: Medicare HMO | Attending: Cardiology | Admitting: Cardiology

## 2022-01-29 VITALS — BP 134/68 | HR 76 | Ht 67.5 in | Wt 207.6 lb

## 2022-01-29 DIAGNOSIS — E785 Hyperlipidemia, unspecified: Secondary | ICD-10-CM | POA: Diagnosis not present

## 2022-01-29 DIAGNOSIS — I2510 Atherosclerotic heart disease of native coronary artery without angina pectoris: Secondary | ICD-10-CM

## 2022-01-29 NOTE — Progress Notes (Addendum)
Cardiology Office Note:    Date:  01/29/2022   ID:  Brenda Leon, DOB 21-Sep-1945, MRN [id number]  PCP:  Crecencio Mc, MD   [place] Prov[facility] Cardiologist:  Kate Sable, MD     Referring MD: Crecencio Mc, MD   Chief Complaint  Patient presents with   Follow-up    3 month follow up,  no new cardiac concerns     History of Present Illness:    Brenda Leon is a 76 y.o. female with a hx of hyperlipidemia, former smoker x 20 years GERD, SLE who presents for follow-up.  Previously seen due to chest pain.  Echo and coronary CTA was obtained to evaluate presence of CAD.  Since last visit, she has felt well, denies chest pain currently.  Nitroglycerin was previously prescribed, patient has not needed any sublingual nitro.  Has a lot of stressors at home as a result of relationship with husband, overall feeling okay, thinks her symptoms are attributable to stress.   Past Medical History:  Diagnosis Date   Anemia    Arthritis    Cataracts, bilateral    Chicken pox    Diverticulitis    Elevated blood pressure reading    GERD (gastroesophageal reflux disease)    Lupus (HCC)    SLE (systemic lupus erythematosus) ([place])    diagnosed with skin biopsy and serology   Thyroid disease     Past Surgical History:  Procedure Laterality Date   ABDOMINAL HYSTERECTOMY  1987   APPENDECTOMY  1952   BREAST CYST ASPIRATION Right 25 + yrs ago   neg   CATARACT EXTRACTION, BILATERAL Bilateral 1995, [place]   x2 1995 and [place] ARTHROSCOPY W/ ACL RECONSTRUCTION     STRABISMUS SURGERY Bilateral 09/12/2017   Procedure: BILATERAL STRABISMUS REPAIR;  Surgeon: Everitt Amber, MD;  Location: [facility];  Service: Ophthalmology;  Laterality: Bilateral;   TONSILLECTOMY AND ADENOIDECTOMY      Current Medications: Current Meds  Medication Sig   acetaminophen (TYLENOL) 500 MG tablet Take 650 mg by mouth every 6 (six) hours as needed.  '1300mg'$  twice daily   ALPRAZolam (XANAX) 0.25 MG tablet TAKE 1 TABLET BY MOUTH AT BEDTIME AS NEEDED FOR ANXIETY (Patient taking differently: Take 0.25 mg by mouth at bedtime as needed for anxiety or sleep.)   aspirin EC 81 MG tablet Take 1 tablet (81 mg total) by mouth daily. Swallow whole.   atorvastatin (LIPITOR) 20 MG tablet Take 1 tablet by mouth once daily (Patient taking differently: Take 20 mg by mouth daily.)   b complex vitamins tablet Take 1 tablet by mouth daily.   Cholecalciferol (VITAMIN D3) 1000 units CAPS Take 2,000 Units by mouth daily. Take 4,000 units daily   Flaxseed, Linseed, (FLAX SEED OIL PO) Take 1 tablet by mouth daily.   Horse Chestnut 300 MG CAPS Take 1 capsule by mouth daily.   hydroxychloroquine (PLAQUENIL) 200 MG tablet Take 200 mg by mouth 2 (two) times daily.   MAGNESIUM GLUCONATE PO Take 1 tablet by mouth daily.   meloxicam (MOBIC) 15 MG tablet Take 1 tablet by mouth daily.   Multiple Vitamins-Minerals (PRESERVISION AREDS 2+MULTI VIT PO) Take 1 capsule by mouth daily.   Omeprazole 20 MG TBDD Take 1 tablet by mouth daily.   traMADol (ULTRAM) 50 MG tablet Take 1 tablet (50 mg total) by mouth daily as needed for moderate pain or severe pain.  Varenicline Tartrate (TYRVAYA NA) Place into the nose. Unable to confirm dosage with p/t     Allergies:   Patient has no known allergies.   Social History   Socioeconomic History   Marital status: Married    Spouse name: Not on file   Number of children: Not on file   Years of education: Not on file   Highest education level: Not on file  Occupational History   Not on file  Tobacco Use   Smoking status: Former    Types: Cigarettes    Quit date: 02/25/1986    Years since quitting: 35.9   Smokeless tobacco: Never  Vaping Use   Vaping Use: Never used  Substance and Sexual Activity   Alcohol use: Not Currently    Alcohol/week: 1.0 standard drink of alcohol    Types: 1 Shots of liquor per week    Comment: vodka  tonic daily   Drug use: No   Sexual activity: Yes  Other Topics Concern   Not on file  Social History Narrative   Not on file   Social Determinants of Health   Financial Resource Strain: Low Risk  (03/22/2021)   Overall Financial Resource Strain (CARDIA)    Difficulty of Paying Living Expenses: Not very hard  Food Insecurity: No Food Insecurity (03/22/2021)   Hunger Vital Sign    Worried About Running Out of Food in the Last Year: Never true    Ran Out of Food in the Last Year: Never true  Transportation Needs: No Transportation Needs (03/22/2021)   PRAPARE - Hydrologist (Medical): No    Lack of Transportation (Non-Medical): No  Physical Activity: Inactive (03/22/2021)   Exercise Vital Sign    Days of Exercise per Week: 0 days    Minutes of Exercise per Session: 0 min  Stress: Stress Concern Present (03/22/2021)   [place]    Feeling of Stress : To some extent  Social Connections: Moderately Isolated (03/22/2021)   Social Connection and Isolation Panel [NHANES]    Frequency of Communication with Friends and Family: Once a week    Frequency of Social Gatherings with Friends and Family: Once a week    Attends Religious Services: Never    Marine scientist or Organizations: Yes    Attends Music therapist: Not on file    Marital Status: Married     Family History: The patient's family history includes Aneurysm in her mother; Diabetes in her sister; Heart attack in her father; Heart disease in her father; Hyperlipidemia in her brother, sister, sister, and son; Hypertension in her father, sister, and son; Kidney disease in her sister; Other in her mother. There is no history of Breast cancer.  ROS:   Please see the history of present illness.     All other systems reviewed and are negative.  EKGs/Labs/Other Studies Reviewed:    The following studies were reviewed  today:   EKG:  EKG not ordered today.    Recent Labs: 04/24/2021: Hemoglobin 12.4; Platelets 224.0 06/28/2021: ALT 18 10/04/2021: BUN 19; Creatinine, Ser 0.81; Potassium 4.2; Sodium 142  Recent Lipid Panel    Component Value Date/Time   CHOL 148 10/04/2021 1234   TRIG 91 10/04/2021 1234   HDL 55 10/04/2021 1234   CHOLHDL 2.7 10/04/2021 1234   VLDL 18 10/04/2021 1234   LDLCALC 75 10/04/2021 1234   LDLDIRECT 79 10/04/2021 1234  Risk Assessment/Calculations:         Physical Exam:    VS:  BP 134/68 (BP Location: Left Arm, Patient Position: Sitting, Cuff Size: Large)   Pulse 76   Ht 5' 7.5" (1.715 m)   Wt 207 lb 9.6 oz (94.2 kg)   SpO2 98%   BMI 32.03 kg/m     Wt Readings from Last 3 Encounters:  01/29/22 207 lb 9.6 oz (94.2 kg)  10/25/21 204 lb 12.8 oz (92.9 kg)  10/04/21 205 lb 4 oz (93.1 kg)     GEN:  Well nourished, well developed in no acute distress HEENT: Normal NECK: No JVD; No carotid bruits CARDIAC: RRR, no murmurs, rubs, gallops RESPIRATORY:  Clear to auscultation without rales, wheezing or rhonchi  ABDOMEN: Soft, non-tender, non-distended MUSCULOSKELETAL:  No edema; No deformity  SKIN: Warm and dry NEUROLOGIC:  Alert and oriented x 3 PSYCHIATRIC:  Normal affect   ASSESSMENT:    1. Coronary artery disease involving native coronary artery of native heart, unspecified whether angina present   2. Hyperlipidemia LDL goal <70     PLAN:    In order of problems listed above:  Nonobstructive CAD, .  Echo with EF 60 to 65%.  Coronary CT with calcium score 309.  Mild stenosis in the mid LAD and RCA, minimal stenosis in proximal left circumflex disease.  Aspirin 81 mg daily, Lipitor 20 mg daily. Hyperlipidemia, continue Lipitor 20 mg  Follow-up in 1 year.     Medication Adjustments/Labs and Tests Ordered: Current medicines are reviewed at length with the patient today.  Concerns regarding medicines are outlined above.  No orders of the defined types  were placed in this encounter.  No orders of the defined types were placed in this encounter.   Patient Instructions  Medication Instructions:   Your physician recommends that you continue on your current medications as directed. Please refer to the Current Medication list given to you today.  *If you need a refill on your cardiac medications before your next appointment, please call your pharmacy*   Lab Work:  None Ordered  If you have labs (blood work) drawn today and your tests are completely normal, you will receive your results only by: [place] (if you have MyChart) OR A paper copy in the mail If you have any lab test that is abnormal or we need to change your treatment, we will call you to review the results.   Testing/Procedures:  None Ordered   Follow-Up: At [facility], you and your health needs are our priority.  As part of our continuing mission to provide you with exceptional heart care, we have created designated Provider Care Teams.  These Care Teams include your primary Cardiologist (physician) and Advanced Practice Providers (APPs -  Physician Assistants and Nurse Practitioners) who all work together to provide you with the care you need, when you need it.  We recommend signing up for the patient portal called "MyChart".  Sign up information is provided on this After Visit Summary.  MyChart is used to connect with patients for Virtual Visits (Telemedicine).  Patients are able to view lab/test results, encounter notes, upcoming appointments, etc.  Non-urgent messages can be sent to your provider as well.   To learn more about what you can do with MyChart, go to [email or web address].    Your next appointment:   12 month(s)  The format for your next appointment:   In Person  Provider:   You may see Aaron Edelman  Agbor-Etang, MD or one of the following Advanced Practice Providers on your designated Care Team:   Murray Hodgkins, NP Christell Faith,  PA-C Cadence Kathlen Mody, PA-C Gerrie Nordmann, NP      Signed, Kate Sable, MD  01/29/2022 10:29 AM    [place]

## 2022-01-29 NOTE — Patient Instructions (Signed)
Medication Instructions:   Your physician recommends that you continue on your current medications as directed. Please refer to the Current Medication list given to you today.  *If you need a refill on your cardiac medications before your next appointment, please call your pharmacy*   Lab Work:  None Ordered  If you have labs (blood work) drawn today and your tests are completely normal, you will receive your results only by: [place] (if you have MyChart) OR A paper copy in the mail If you have any lab test that is abnormal or we need to change your treatment, we will call you to review the results.   Testing/Procedures:  None Ordered   Follow-Up: At [facility], you and your health needs are our priority.  As part of our continuing mission to provide you with exceptional heart care, we have created designated Provider Care Teams.  These Care Teams include your primary Cardiologist (physician) and Advanced Practice Providers (APPs -  Physician Assistants and Nurse Practitioners) who all work together to provide you with the care you need, when you need it.  We recommend signing up for the patient portal called "MyChart".  Sign up information is provided on this After Visit Summary.  MyChart is used to connect with patients for Virtual Visits (Telemedicine).  Patients are able to view lab/test results, encounter notes, upcoming appointments, etc.  Non-urgent messages can be sent to your provider as well.   To learn more about what you can do with MyChart, go to [email or web address].    Your next appointment:   12 month(s)  The format for your next appointment:   In Person  Provider:   You may see Kate Sable, MD or one of the following Advanced Practice Providers on your designated Care Team:   Murray Hodgkins, NP Christell Faith, PA-C Cadence Kathlen Mody, PA-C Gerrie Nordmann, NP

## 2022-02-06 NOTE — Telephone Encounter (Signed)
MyChart messgae sent to patient. 

## 2022-03-04 ENCOUNTER — Encounter: Payer: Self-pay | Admitting: Internal Medicine

## 2022-03-05 NOTE — Telephone Encounter (Signed)
RSV,  New Covid, Flu vaccines

## 2022-03-19 DIAGNOSIS — M1712 Unilateral primary osteoarthritis, left knee: Secondary | ICD-10-CM | POA: Diagnosis not present

## 2022-03-20 ENCOUNTER — Encounter: Payer: Self-pay | Admitting: Internal Medicine

## 2022-03-20 NOTE — Telephone Encounter (Signed)
Patient calling back to see if there has been an update to her clearance. Please advise

## 2022-03-20 NOTE — Telephone Encounter (Signed)
I am going to forward to pre op APP for review, as well as to MD Dr. Charlestine Night

## 2022-03-20 NOTE — Telephone Encounter (Signed)
See other mycvhart message

## 2022-03-21 NOTE — Telephone Encounter (Signed)
   Patient Name: Brenda Leon  DOB: Feb 07, 1946 MRN: [id number]  Primary Cardiologist: Kate Sable, MD  Chart reviewed as part of pre-operative protocol coverage. Pre-op clearance already addressed by colleagues in earlier phone notes. To summarize recommendations:  -Any orthopedic surgery is considered "moderate risk". In this case, we prefer ASA to be continued however, if the bleeding risk it too high, she can hold her ASA x 7 day prior to surgery and resume when medically safe to do so. She is on ASA for nonobstructive CAD.  Will route this bundled recommendation to requesting provider via Epic fax function and remove from pre-op pool. Please call with questions.  Elgie Collard, PA-C 03/21/2022, 12:22 PM

## 2022-03-21 NOTE — Telephone Encounter (Signed)
   Patient Name: Brenda Leon  DOB: 1945-09-08 MRN: [id number]  Primary Cardiologist: Kate Sable, MD  Chart reviewed as part of pre-operative protocol coverage. Pre-op clearance already addressed by colleagues in earlier phone notes. To summarize recommendations:  -Okay for procedure from a cardiac perspective.  EF normal, he has mild nonobstructive CAD.  Dr. Charlestine Night   Will route this bundled recommendation to requesting provider via Epic fax function and remove from pre-op pool. Please call with questions.  Elgie Collard, PA-C 03/21/2022, 8:34 AM

## 2022-03-21 NOTE — Telephone Encounter (Signed)
S/w the surgeon office today and they need ASA hold recommendations.

## 2022-03-22 ENCOUNTER — Ambulatory Visit (INDEPENDENT_AMBULATORY_CARE_PROVIDER_SITE_OTHER): Payer: Medicare HMO

## 2022-03-22 VITALS — Ht 67.5 in | Wt 207.0 lb

## 2022-03-22 DIAGNOSIS — Z1231 Encounter for screening mammogram for malignant neoplasm of breast: Secondary | ICD-10-CM

## 2022-03-22 DIAGNOSIS — Z1211 Encounter for screening for malignant neoplasm of colon: Secondary | ICD-10-CM | POA: Diagnosis not present

## 2022-03-22 DIAGNOSIS — Z0000 Encounter for general adult medical examination without abnormal findings: Secondary | ICD-10-CM | POA: Diagnosis not present

## 2022-03-22 NOTE — Patient Instructions (Addendum)
Brenda Leon , Thank you for taking time to come for your Medicare Wellness Visit. I appreciate your ongoing commitment to your health goals. Please review the following plan we discussed and let me know if I can assist you in the future.   These are the goals we discussed:  Goals Addressed             This Visit's Progress    Maintain healthy lifestyle       Stay active and increase exercise after knee surgery Healthy diet Stay hydrated         This is a list of the screening recommended for you and due dates:  Health Maintenance  Topic Date Due   Cologuard (Stool DNA test)  02/03/2022   COVID-19 Vaccine (6 - 2023-24 season) 04/07/2022*   Flu Shot  05/26/2022*   Medicare Annual Wellness Visit  03/23/2023   DTaP/Tdap/Td vaccine (3 - Td or Tdap) 07/18/2031   Pneumonia Vaccine  Completed   DEXA scan (bone density measurement)  Completed   Hepatitis C Screening: USPSTF Recommendation to screen - Ages 27-79 yo.  Completed   Zoster (Shingles) Vaccine  Completed   HPV Vaccine  Aged Out   Colon Cancer Screening  Discontinued  *Topic was postponed. The date shown is not the original due date.   Conditions/risks identified: none new  Next appointment: Follow up in one year for your annual wellness visit    Preventive Care 65 Years and Older, Female Preventive care refers to lifestyle choices and visits with your health care provider that can promote health and wellness. What does preventive care include? A yearly physical exam. This is also called an annual well check. Dental exams once or twice a year. Routine eye exams. Ask your health care provider how often you should have your eyes checked. Personal lifestyle choices, including: Daily care of your teeth and gums. Regular physical activity. Eating a healthy diet. Avoiding tobacco and drug use. Limiting alcohol use. Practicing safe sex. Taking low-dose aspirin every day. Taking vitamin and mineral supplements as  recommended by your health care provider. What happens during an annual well check? The services and screenings done by your health care provider during your annual well check will depend on your age, overall health, lifestyle risk factors, and family history of disease. Counseling  Your health care provider may ask you questions about your: Alcohol use. Tobacco use. Drug use. Emotional well-being. Home and relationship well-being. Sexual activity. Eating habits. History of falls. Memory and ability to understand (cognition). Work and work Statistician. Reproductive health. Screening  You may have the following tests or measurements: Height, weight, and BMI. Blood pressure. Lipid and cholesterol levels. These may be checked every 5 years, or more frequently if you are over 67 years old. Skin check. Lung cancer screening. You may have this screening every year starting at age 22 if you have a 30-pack-year history of smoking and currently smoke or have quit within the past 15 years. Fecal occult blood test (FOBT) of the stool. You may have this test every year starting at age 23. Flexible sigmoidoscopy or colonoscopy. You may have a sigmoidoscopy every 5 years or a colonoscopy every 10 years starting at age 80. Hepatitis C blood test. Hepatitis B blood test. Sexually transmitted disease (STD) testing. Diabetes screening. This is done by checking your blood sugar (glucose) after you have not eaten for a while (fasting). You may have this done every 1-3 years. Bone density scan. This is  done to screen for osteoporosis. You may have this done starting at age 48. Mammogram. This may be done every 1-2 years. Talk to your health care provider about how often you should have regular mammograms. Talk with your health care provider about your test results, treatment options, and if necessary, the need for more tests. Vaccines  Your health care provider may recommend certain vaccines, such  as: Influenza vaccine. This is recommended every year. Tetanus, diphtheria, and acellular pertussis (Tdap, Td) vaccine. You may need a Td booster every 10 years. Zoster vaccine. You may need this after age 100. Pneumococcal 13-valent conjugate (PCV13) vaccine. One dose is recommended after age 25. Pneumococcal polysaccharide (PPSV23) vaccine. One dose is recommended after age 3. Talk to your health care provider about which screenings and vaccines you need and how often you need them. This information is not intended to replace advice given to you by your health care provider. Make sure you discuss any questions you have with your health care provider. Document Released: 03/10/2015 Document Revised: 11/01/2015 Document Reviewed: 12/13/2014 Elsevier Interactive Patient Education  2017 [place] Prevention in the Home Falls can cause injuries. They can happen to people of all ages. There are many things you can do to make your home safe and to help prevent falls. What can I do on the outside of my home? Regularly fix the edges of walkways and driveways and fix any cracks. Remove anything that might make you trip as you walk through a door, such as a raised step or threshold. Trim any bushes or trees on the path to your home. Use bright outdoor lighting. Clear any walking paths of anything that might make someone trip, such as rocks or tools. Regularly check to see if handrails are loose or broken. Make sure that both sides of any steps have handrails. Any raised decks and porches should have guardrails on the edges. Have any leaves, snow, or ice cleared regularly. Use sand or salt on walking paths during winter. Clean up any spills in your garage right away. This includes oil or grease spills. What can I do in the bathroom? Use night lights. Install grab bars by the toilet and in the tub and shower. Do not use towel bars as grab bars. Use non-skid mats or decals in the tub or  shower. If you need to sit down in the shower, use a plastic, non-slip stool. Keep the floor dry. Clean up any water that spills on the floor as soon as it happens. Remove soap buildup in the tub or shower regularly. Attach bath mats securely with double-sided non-slip rug tape. Do not have throw rugs and other things on the floor that can make you trip. What can I do in the bedroom? Use night lights. Make sure that you have a light by your bed that is easy to reach. Do not use any sheets or blankets that are too big for your bed. They should not hang down onto the floor. Have a firm chair that has side arms. You can use this for support while you get dressed. Do not have throw rugs and other things on the floor that can make you trip. What can I do in the kitchen? Clean up any spills right away. Avoid walking on wet floors. Keep items that you use a lot in easy-to-reach places. If you need to reach something above you, use a strong step stool that has a grab bar. Keep electrical cords out of  the way. Do not use floor polish or wax that makes floors slippery. If you must use wax, use non-skid floor wax. Do not have throw rugs and other things on the floor that can make you trip. What can I do with my stairs? Do not leave any items on the stairs. Make sure that there are handrails on both sides of the stairs and use them. Fix handrails that are broken or loose. Make sure that handrails are as long as the stairways. Check any carpeting to make sure that it is firmly attached to the stairs. Fix any carpet that is loose or worn. Avoid having throw rugs at the top or bottom of the stairs. If you do have throw rugs, attach them to the floor with carpet tape. Make sure that you have a light switch at the top of the stairs and the bottom of the stairs. If you do not have them, ask someone to add them for you. What else can I do to help prevent falls? Wear shoes that: Do not have high heels. Have  rubber bottoms. Are comfortable and fit you well. Are closed at the toe. Do not wear sandals. If you use a stepladder: Make sure that it is fully opened. Do not climb a closed stepladder. Make sure that both sides of the stepladder are locked into place. Ask someone to hold it for you, if possible. Clearly mark and make sure that you can see: Any grab bars or handrails. First and last steps. Where the edge of each step is. Use tools that help you move around (mobility aids) if they are needed. These include: Canes. Walkers. Scooters. Crutches. Turn on the lights when you go into a dark area. Replace any light bulbs as soon as they burn out. Set up your furniture so you have a clear path. Avoid moving your furniture around. If any of your floors are uneven, fix them. If there are any pets around you, be aware of where they are. Review your medicines with your doctor. Some medicines can make you feel dizzy. This can increase your chance of falling. Ask your doctor what other things that you can do to help prevent falls. This information is not intended to replace advice given to you by your health care provider. Make sure you discuss any questions you have with your health care provider. Document Released: 12/08/2008 Document Revised: 07/20/2015 Document Reviewed: 03/18/2014 Elsevier Interactive Patient Education  2017 [place] A mammogram is an X-ray of the breasts. This procedure can screen for and detect any changes that may indicate breast cancer. Mammograms are regularly done beginning at age 47 for women with average risk. A man may have a mammogram if he has a lump or swelling in his breast tissue. A mammogram can also identify other changes and variations in the breast, such as: Inflammation of the breast tissue (mastitis). An infected area that contains a collection of pus (abscess). A fluid-filled sac (cyst). Tumors that are not cancerous  (benign). Fibrocystic changes. This is when breast tissue becomes denser and can make the tissue feel rope-like or uneven under the skin. Women at higher risk for breast cancer need earlier and more comprehensive screening for abnormal changes. Breast tomosynthesis, or three-dimensional (3D) mammography, and digital breast tomosynthesis are advanced forms of imaging that create 3D pictures of the breasts. Tell a health care provider: About any allergies you have. If you have breast implants. If you have had previous breast disease,  biopsy, or surgery. If you have a family history of breast cancer. If you are breastfeeding. Whether you are pregnant or may be pregnant. What are the risks? Generally, this is a safe procedure. However, problems may occur, including: Exposure to radiation. Radiation levels are very low with this test. The need for more tests. The mammogram fails to detect certain cancers or the results are misinterpreted. Difficulty with detecting breast cancer in women with dense breasts. What happens before the procedure? Schedule your test about 1-2 weeks after your menstrual period if you are menstruating. This is usually when your breasts are the least tender. If you have had a mammogram done at a different facility in the past, get the mammogram X-rays or have them sent to your current exam facility. The new and old images will be compared. Wash your breasts and underarms on the day of the test. Do not wear deodorants, perfumes, lotions, or powders anywhere on your body on the day of the test. Remove any jewelry from your neck. Wear clothes that you can change into and out of easily. What happens during the procedure?  You will undress from the waist up and put on a gown that opens in the front. You will stand in front of the X-ray machine. Each breast will be placed between two plastic or glass plates. The plates will compress your breast for a few seconds. Try to stay  as relaxed as possible. This procedure does not cause any harm to your breasts. Any discomfort you feel will be very brief. X-rays will be taken from different angles of each breast. The procedure may vary among health care providers and hospitals. What can I expect after the procedure? The mammogram will be examined by a specialist (radiologist). You may need to repeat certain parts of the test, depending on the quality of the images. This is done if the radiologist needs a better view of the breast tissue. You may resume your normal activities. It is up to you to get the results of your procedure. Ask your health care provider, or the department that is doing the procedure, when your results will be ready. Summary A mammogram is an X-ray of the breasts. This procedure can screen for and detect any changes that may indicate breast cancer. A man may have a mammogram if he has a lump or swelling in his breast tissue. If you have had a mammogram done at a different facility in the past, get the mammogram X-rays or have them sent to your current exam facility in order to compare them. Schedule your test about 1-2 weeks after your menstrual period if you are menstruating. Ask when your test results will be ready. Make sure you get your test results. This information is not intended to replace advice given to you by your health care provider. Make sure you discuss any questions you have with your health care provider. Document Revised: 10/25/2020 Document Reviewed: 10/18/2021Opioid Pain Medicine Management Opioid pain medicines are strong medicines that are used to treat bad or very bad pain. When you take them for a short time, they can help you: Sleep better. Do better in physical therapy. Feel better during the first few days after you get hurt. Recover from surgery. Only take these medicines if a doctor says that you can. You should only take them for a short time. This is because opioids can be  very addictive. This means that they are hard to stop taking. The longer you take  opioids, the harder it may be to stop taking them. What are the risks? Opioids can cause problems (side effects). Taking them for more than 3 days raises your chance of problems, such as: Trouble pooping (constipation). Feeling sick to your stomach (nausea). Vomiting. Feeling very sleepy. Confusion. Not being able to stop taking the medicine. Breathing problems. Taking opioids for a long time can make it hard for you to do daily tasks. It can also put you at risk for: Car accidents. Depression. Suicide. Heart attack. Taking too much of the medicine (overdose). This can lead to death. What is a pain treatment plan? A pain treatment plan is a plan made by you and your doctor. Work with your doctor to make a plan for treating your pain. To help you do this: Talk about the goals of your treatment, including: How much pain you might expect to have. How you will manage the pain. Talk about the risks and benefits of taking these medicines for your condition. Remember that a good treatment plan uses more than one approach and lowers the risks of side effects. Tell your doctor about the amount of medicines you take and about any drug or alcohol use. Get your pain medicine prescriptions from only one doctor. Pain can be managed with other treatments. Work with your doctor to find other ways to help your pain, such as: Physical therapy or doing gentle exercises. Counseling. Eating healthy foods. Massage. Meditation. Other pain medicines. How to use opioid pain medicine safely Taking medicine Take your pain medicine exactly as told by your doctor. Take it only when you need it. If your pain is not too bad, you may take less medicine if your doctor allows. If you have no pain, do not take the medicine unless your doctor tells you to take it. If your pain is very bad, do not take more medicine than your doctor  told you to take. Call your doctor to know what to do. Write down the times when you take your pain medicine. Look at the times before you take your next dose. Take other over-the-counter or prescription medicines only as told by your doctor. Keeping yourself and others safe  While you are taking opioids: Do not drive, use machines, or power tools. Do not sign important papers (legal documents). Do not drink alcohol. Do not take sleeping pills. Do not take care of children by yourself. Do not do activities where you need to climb or be in high places, like working on a ladder. Do not go to a lake, river, ocean, swimming pool, or hot tub. Keep your opioids locked up or in a place where children cannot reach them. Do not share your pain medicine with anyone. Stopping your use of opioids If you have been taking opioids for more than a few weeks, you may need to slowly decrease (taper) how much you take until you stop taking them. Doing this can lower your chance of having symptoms.  Symptoms that come from suddenly stopping the use of opioids include: Pain and cramping in your belly (abdomen). Feeling sick to your stomach (nausea).z Sweating. Feeling very sleepy. Feeling restless. Shaking you cannot control (tremors). Cravings for the medicine. Do not try to stop taking them by yourself. Work with your doctor to stop. Your doctor will help you take less until you are not taking the medicine at all. Getting rid of unused pills Do not save any pills that you did not use. Get rid of the pills  by: Taking them to a take-back program in your area. Bringing them to a pharmacy that receives unused pills. Flushing them down the toilet. Check the label or package insert of your medicine to see whether this is safe to do. Throwing them in the trash. Check the label or package insert of your medicine to see whether this is safe to do. If it is safe to throw them out: Take the pills out of their  container. Put the pills into a container you can seal. Mix the pills with used coffee grounds, food scraps, dirt, or cat litter. Put this in the trash. Follow these instructions at home: Activity Do exercises as told by your doctor. Avoid doing things that make your pain worse. Return to your normal activities as told by your doctor. Ask your doctor what activities are safe for you. General instructions You may need to take these actions to prevent or treat constipation: Drink enough fluid to keep your pee (urine) pale yellow. Take over-the-counter or prescription medicines. Eat foods that are high in fiber. These include beans, whole grains, and fresh fruits and vegetables. Limit foods that are high in fat and sugar. These include fried or sweet foods. Keep all follow-up visits. Where to find support If you have been taking opioids for a long time, get help from a local support group or counselor. Ask your doctor about this. Where to find more information Centers for Disease Control and Prevention (CDC): http://www.wolf.info/ [place] Food and Drug Administration (FDA): [place] Get help right away if: You may have taken too much of an opioid (overdosed). Common symptoms of an overdose: Your breathing is slower or more shallow than normal. You have a very slow heartbeat. Your speech is not normal. You vomit or you feel as if you may vomit. The black centers of your eyes (pupils) are smaller than normal. You have other potential symptoms: You feel very confused. You faint. You are very sleepy. You have cold skin. You have blue lips or fingernails. You have thoughts of harming yourself or harming others. These symptoms may be an emergency. Get help right away. Call your local emergency services (911 in the [place] Do not wait to see if the symptoms will go away. Do not drive yourself to the hospital. Get help right away if you feel like you may hurt yourself or others, or have thoughts  about taking your own life. Go to your nearest emergency room or: Call your local emergency services (911 in the [place] Call the [facility] at (251)674-1671. Call a suicide crisis helpline, such as the [facility] at (720)071-1816 or 988 in the [place]. This is open 24 hours a day. Text the Crisis Text Line at 3235908813. Summary Opioid are strong medicines that are used to treat bad or very bad pain. A pain treatment plan is a plan made by you and your doctor. Work with your doctor to make a plan for treating your pain. If you think that you or someone else may have taken too much of an opioid, get help right away. This information is not intended to replace advice given to you by your health care provider. Make sure you discuss any questions you have with your health care provider. Document Revised: 09/06/2020 Document Reviewed: 05/24/2020 Elsevier Patient Education  [place] Patient Education  [place].

## 2022-03-22 NOTE — Progress Notes (Addendum)
Subjective:   Brenda Leon is a 77 y.o. female who presents for Medicare Annual (Subsequent) preventive examination.  Review of Systems    No ROS.  Medicare Wellness Virtual Visit.  Visual/audio telehealth visit, UTA vital signs.   See social history for additional risk factors.   Cardiac Risk Factors include: advanced age (>93mn, >>88women)     Objective:    Today's Vitals   03/22/22 1116  Weight: 207 lb (93.9 kg)  Height: 5' 7.5" (1.715 m)   Body mass index is 31.94 kg/m.     03/22/2022   11:07 AM 03/08/2021    4:01 PM 11/19/2019    2:12 PM 09/24/2018    1:12 PM 09/12/2017    9:23 AM 09/04/2017    2:49 PM 10/19/2015    9:58 AM  Advanced Directives  Does Patient Have a Medical Advance Directive? No No No Yes No No No  Type of AScientist, research (medical)Living will     Does patient want to make changes to medical advance directive?    No - Patient declined     Copy of [place] Chart?    No - copy requested     Would patient like information on creating a medical advance directive? No - Patient declined No - Patient declined Yes (MAU/Ambulatory/Procedural Areas - Information given)  No - Patient declined  No - patient declined information    Current Medications (verified) Outpatient Encounter Medications as of 03/22/2022  Medication Sig   acetaminophen (TYLENOL) 500 MG tablet Take 650 mg by mouth every 6 (six) hours as needed. '1300mg'$  twice daily   ALPRAZolam (XANAX) 0.25 MG tablet TAKE 1 TABLET BY MOUTH AT BEDTIME AS NEEDED FOR ANXIETY (Patient taking differently: Take 0.25 mg by mouth at bedtime as needed for anxiety or sleep.)   aspirin EC 81 MG tablet Take 1 tablet (81 mg total) by mouth daily. Swallow whole.   atorvastatin (LIPITOR) 20 MG tablet Take 1 tablet by mouth once daily (Patient taking differently: Take 20 mg by mouth daily.)   b complex vitamins tablet Take 1 tablet by mouth daily.   Cholecalciferol (VITAMIN D3)  1000 units CAPS Take 2,000 Units by mouth daily. Take 4,000 units daily   Flaxseed, Linseed, (FLAX SEED OIL PO) Take 1 tablet by mouth daily.   Horse Chestnut 300 MG CAPS Take 1 capsule by mouth daily.   hydroxychloroquine (PLAQUENIL) 200 MG tablet Take 200 mg by mouth 2 (two) times daily.   MAGNESIUM GLUCONATE PO Take 1 tablet by mouth daily.   meloxicam (MOBIC) 15 MG tablet Take 1 tablet by mouth daily.   Multiple Vitamins-Minerals (PRESERVISION AREDS 2+MULTI VIT PO) Take 1 capsule by mouth daily.   nitroGLYCERIN (NITROSTAT) 0.4 MG SL tablet Place 1 tablet (0.4 mg total) under the tongue every 5 (five) minutes as needed for chest pain.   Omeprazole 20 MG TBDD Take 1 tablet by mouth daily.   traMADol (ULTRAM) 50 MG tablet Take 1 tablet (50 mg total) by mouth daily as needed for moderate pain or severe pain.   Varenicline Tartrate (TYRVAYA NA) Place into the nose. Unable to confirm dosage with p/t   XIIDRA 5 % SOLN Apply 1 application  topically daily. (Patient not taking: Reported on 01/29/2022)   No facility-administered encounter medications on file as of 03/22/2022.    Allergies (verified) Patient has no known allergies.   History: Past Medical History:  Diagnosis Date  Anemia    Arthritis    Cataracts, bilateral    Chicken pox    Diverticulitis    Elevated blood pressure reading    GERD (gastroesophageal reflux disease)    Lupus (HCC)    SLE (systemic lupus erythematosus) ([place])    diagnosed with skin biopsy and serology   Thyroid disease    Past Surgical History:  Procedure Laterality Date   ABDOMINAL HYSTERECTOMY  1987   APPENDECTOMY  1952   BREAST CYST ASPIRATION Right 25 + yrs ago   neg   CATARACT EXTRACTION, BILATERAL Bilateral 1995, [place]   x2 1995 and [place] ARTHROSCOPY W/ ACL RECONSTRUCTION     STRABISMUS SURGERY Bilateral 09/12/2017   Procedure: BILATERAL STRABISMUS REPAIR;  Surgeon: Everitt Amber, MD;  Location: [facility];  Service: Ophthalmology;  Laterality: Bilateral;   TONSILLECTOMY AND ADENOIDECTOMY     Family History  Problem Relation Age of Onset   Aneurysm Mother        AAA   Other Mother        varicose veins   Hypertension Father    Heart disease Father    Heart attack Father    Diabetes Sister    Hyperlipidemia Sister    Hypertension Sister    Kidney disease Sister    Hyperlipidemia Brother    Hypertension Son    Hyperlipidemia Son    Hyperlipidemia Sister    Breast cancer Neg Hx    Social History   Socioeconomic History   Marital status: Married    Spouse name: Not on file   Number of children: Not on file   Years of education: Not on file   Highest education level: Not on file  Occupational History   Not on file  Tobacco Use   Smoking status: Former    Types: Cigarettes    Quit date: 02/25/1986    Years since quitting: 36.0   Smokeless tobacco: Never  Vaping Use   Vaping Use: Never used  Substance and Sexual Activity   Alcohol use: Not Currently    Alcohol/week: 1.0 standard drink of alcohol    Types: 1 Shots of liquor per week    Comment: vodka tonic daily   Drug use: No   Sexual activity: Yes  Other Topics Concern   Not on file  Social History Narrative   Not on file   Social Determinants of Health   Financial Resource Strain: Low Risk  (03/22/2022)   Overall Financial Resource Strain (CARDIA)    Difficulty of Paying Living Expenses: Not very hard  Food Insecurity: No Food Insecurity (03/22/2022)   Hunger Vital Sign    Worried About Running Out of Food in the Last Year: Never true    Ran Out of Food in the Last Year: Never true  Transportation Needs: No Transportation Needs (03/22/2022)   PRAPARE - Hydrologist (Medical): No    Lack of Transportation (Non-Medical): No  Physical Activity: Inactive (03/22/2021)   Exercise Vital Sign    Days of Exercise per Week: 0 days    Minutes of Exercise per Session: 0 min  Stress:  Stress Concern Present (03/22/2022)   [place]    Feeling of Stress : To some extent  Social Connections: Moderately Isolated (03/22/2022)   Social Connection and Isolation Panel [NHANES]    Frequency of Communication with Friends and Family:  Once a week    Frequency of Social Gatherings with Friends and Family: Once a week    Attends Religious Services: Never    Marine scientist or Organizations: Yes    Attends Music therapist: Not on file    Marital Status: Married    Tobacco Counseling Counseling given: Not Answered   Clinical Intake:  Pre-visit preparation completed: Yes        Diabetes: No  How often do you need to have someone help you when you read instructions, pamphlets, or other written materials from your doctor or pharmacy?: 1 - Never    Interpreter Needed?: No      Activities of Daily Living    03/22/2022   11:12 AM  In your present state of health, do you have any difficulty performing the following activities:  Hearing? 0  Vision? 0  Difficulty concentrating or making decisions? 0  Walking or climbing stairs? 0  Comment Paces self with activity. Brace in use for chronic L knee pain. Cane as needed.  Dressing or bathing? 0  Doing errands, shopping? 0  Preparing Food and eating ? N  Using the Toilet? N  In the past six months, have you accidently leaked urine? N  Comment Managed with daily liner/pad.  Do you have problems with loss of bowel control? N  Managing your Medications? N  Managing your Finances? N  Housekeeping or managing your Housekeeping? N    Patient Care Team: Crecencio Mc, MD as PCP - General (Internal Medicine) Kate Sable, MD as PCP - Cardiology (Cardiology) Crecencio Mc, MD (Internal Medicine) Bary Castilla Forest Gleason, MD (General Surgery)  Indicate any recent Medical Services you may have received from other than [facility] providers in  the past year (date may be approximate).     Assessment:   This is a routine wellness examination for Brenda Leon.  I connected with  Brenda Leon on 03/22/22 by a audio enabled telemedicine application and verified that I am speaking with the correct person using two identifiers.  Patient Location: Home  Provider Location: Office/Clinic  I discussed the limitations of evaluation and management by telemedicine. The patient expressed understanding and agreed to proceed.   Hearing/Vision screen Hearing Screening - Comments:: Patient is able to hear conversational tones without difficulty. No issues reported. Vision Screening - Comments:: Followed by Dr. Valetta Close, [facility])  Wears glasses Cataracts extracted, bilateral Patient has annual visits  Dietary issues and exercise activities discussed: Current Exercise Habits: Home exercise routine, Intensity: Mild Regular diet Good water intake    Goals Addressed             This Visit's Progress    Maintain healthy lifestyle       Stay active and increase exercise after knee surgery Healthy diet Stay hydrated       Depression Screen    03/22/2022   11:14 AM 09/25/2021   11:14 AM 09/19/2021    8:25 AM 06/07/2021    9:33 AM 04/24/2021    2:01 PM 03/22/2021    5:21 PM 02/28/2021    8:40 AM  PHQ 2/9 Scores  PHQ - 2 Score 0 '2 2 4 4 2 4  '$ PHQ- 9 Score  '4 4 8 8 6 12    '$ Fall Risk    03/22/2022   11:28 AM 09/25/2021   10:40 AM 09/19/2021    8:25 AM 06/22/2021    4:33 PM 06/07/2021    8:42 AM  Fall  Risk   Falls in the past year? 0 1 0 1 1  Number falls in past yr: 0 1  0 0  Injury with Fall? 0 0  1 1  Risk for fall due to :  History of fall(s)  History of fall(s) History of fall(s)  Follow up Falls evaluation completed;Falls prevention discussed Falls evaluation completed  Falls evaluation completed Falls evaluation completed    FALL RISK PREVENTION PERTAINING TO THE HOME: Home free of loose throw rugs in walkways, pet beds,  electrical cords, etc? Yes  Adequate lighting in your home to reduce risk of falls? Yes   ASSISTIVE DEVICES UTILIZED TO PREVENT FALLS: Life alert? No  Use of a cane, walker or w/c? As needed.   TIMED UP AND GO: Was the test performed? No .   Cognitive Function: Patient is alert and oriented x3.  Manages her own finances and medications.  100% independent.      10/19/2015    9:45 AM  MMSE - Mini Mental State Exam  Orientation to time 5  Orientation to Place 5  Registration 3  Attention/ Calculation 5  Recall 3  Language- name 2 objects 2  Language- repeat 1  Language- follow 3 step command 3  Language- read & follow direction 1  Write a sentence 1  Copy design 1  Total score 30        03/22/2022   11:37 AM 09/24/2018    1:12 PM  6CIT Screen  What Year? 0 points 0 points  What month? 0 points 0 points  What time? 0 points 0 points  Count back from 20  0 points  Months in reverse  0 points    Immunizations Immunization History  Administered Date(s) Administered   Fluad Quad(high Dose 65+) 12/18/2018, 02/28/2021   Influenza Split 12/11/2011   Influenza,inj,Quad PF,6+ Mos 02/23/2013, 01/05/2014, 11/03/2014, 10/19/2015   PFIZER(Purple Top)SARS-COV-2 Vaccination 04/17/2019, 05/11/2019, 12/06/2019, 09/07/2020   Pfizer Covid-19 Vaccine Bivalent Booster 58yr & up 01/20/2021   Pneumococcal Conjugate-13 10/19/2015   Pneumococcal Polysaccharide-23 02/10/2012   Tdap 04/12/2010, 07/17/2021   Zoster Recombinat (Shingrix) 03/06/2018, 07/17/2021   Zoster, Live 01/15/2013   Screening Tests Health Maintenance  Topic Date Due   Fecal DNA (Cologuard)  02/03/2022   COVID-19 Vaccine (6 - 2023-24 season) 04/07/2022 (Originally 10/26/2021)   INFLUENZA VACCINE  05/26/2022 (Originally 09/25/2021)   Medicare Annual Wellness (AWV)  03/23/2023   DTaP/Tdap/Td (3 - Td or Tdap) 07/18/2031   Pneumonia Vaccine 76 Years old  Completed   DEXA SCAN  Completed   Hepatitis C Screening   Completed   Zoster Vaccines- Shingrix  Completed   HPV VACCINES  Aged Out   COLONOSCOPY (Pts 45-487yrInsurance coverage will need to be confirmed)  Discontinued    Health Maintenance Health Maintenance Due  Topic Date Due   Fecal DNA (Cologuard)  02/03/2022   Cologuard- ordered per patient consent.  Mammogram- ordered per patient consent.  Lung Cancer Screening: (Low Dose CT Chest recommended if Age 819-80ears, 30 pack-year currently smoking OR have quit w/in 15years.) does not qualify.   Hepatitis C Screening: Completed 09/2015.  Vision Screening: Recommended annual ophthalmology exams for early detection of glaucoma and other disorders of the eye.  Dental Screening: Recommended annual dental exams for proper oral hygiene.  Community Resource Referral / Chronic Care Management: CRR required this visit?  No   CCM required this visit?  No      Plan:     I  have personally reviewed and noted the following in the patient's chart:   Medical and social history Use of alcohol, tobacco or illicit drugs  Current medications and supplements including opioid prescriptions. Patient is currently taking opioid prescriptions. Information provided to patient regarding non-opioid alternatives. Patient advised to discuss non-opioid treatment plan with their provider. Followed by pcp. Functional ability and status Nutritional status Physical activity Advanced directives List of other physicians Hospitalizations, surgeries, and ER visits in previous 12 months Vitals Screenings to include cognitive, depression, and falls Referrals and appointments  In addition, I have reviewed and discussed with patient certain preventive protocols, quality metrics, and best practice recommendations. A written personalized care plan for preventive services as well as general preventive health recommendations were provided to patient.     [place], LPN   3/96/7289      I have reviewed the  above information and agree with above.   Deborra Medina, MD

## 2022-03-29 ENCOUNTER — Other Ambulatory Visit (HOSPITAL_BASED_OUTPATIENT_CLINIC_OR_DEPARTMENT_OTHER): Payer: Self-pay

## 2022-04-02 ENCOUNTER — Ambulatory Visit: Payer: Medicare HMO | Admitting: Internal Medicine

## 2022-04-02 NOTE — Progress Notes (Signed)
Anesthesia Review:  PCP: Deborra Medina Cardiologist : Chest x-ray : 04/22/21- 2 view  EKG : 10/25/21  Echo : 10/18/21  Cto cors- 10/11/21  Vasc- LOV 07/28/20  Stress test: Cardiac Cath :  Activity level:  Sleep Study/ CPAP : Fasting Blood Sugar :      / Checks Blood Sugar -- times a day:   Blood Thinner/ Instructions /Last Dose: ASA / Instructions/ Last Dose :

## 2022-04-03 NOTE — Patient Instructions (Signed)
SURGICAL WAITING ROOM VISITATION  Patients having surgery or a procedure may have no more than 2 support people in the waiting area - these visitors may rotate.    Children under the age of 59 must have an adult with them who is not the patient.  Due to an increase in RSV and influenza rates and associated hospitalizations, children ages 12 and under may not visit patients in [place].  If the patient needs to stay at the hospital during part of their recovery, the visitor guidelines for inpatient rooms apply. Pre-op nurse will coordinate an appropriate time for 1 support person to accompany patient in pre-op.  This support person may not rotate.    Please refer to the [facility] website for the visitor guidelines for Inpatients (after your surgery is over and you are in a regular room).       Your procedure is scheduled on:  04/17/22    Report to [facility] Main Entrance    Report to admitting at   1115AM   Call this number if you have problems the morning of surgery [phone]   Do not eat food :After Midnight.   After Midnight you may have the following liquids until __ 1045____ AM  DAY OF SURGERY  Water Non-Citrus Juices (without pulp, NO RED-Apple, White grape, White cranberry) Black Coffee (NO MILK/CREAM OR CREAMERS, sugar ok)  Clear Tea (NO MILK/CREAM OR CREAMERS, sugar ok) regular and decaf                             Plain Jell-O (NO RED)                                           Fruit ices (not with fruit pulp, NO RED)                                     Popsicles (NO RED)                                                               Sports drinks like Gatorade (NO RED)                    The day of surgery:  Drink ONE (1) Pre-Surgery Clear Ensure or G2 at   1045AM  ( have completed by )  the morning of surgery. Drink in one sitting. Do not sip.  This drink was given to you during your hospital  pre-op appointment visit. Nothing else to  drink after completing the  Pre-Surgery Clear Ensure or G2.          If you have questions, please contact your surgeon's office.     Oral Hygiene is also important to reduce your risk of infection.                                    Remember - BRUSH YOUR TEETH THE MORNING OF SURGERY WITH  YOUR REGULAR TOOTHPASTE  DENTURES WILL BE REMOVED PRIOR TO SURGERY PLEASE DO NOT APPLY "Poly grip" OR ADHESIVES!!!   Do NOT smoke after Midnight   Take these medicines the morning of surgery with A SIP OF WATER:  omeprazole   DO NOT TAKE ANY ORAL DIABETIC MEDICATIONS DAY OF YOUR SURGERY  Bring CPAP mask and tubing day of surgery.                              You may not have any metal on your body including hair pins, jewelry, and body piercing             Do not wear make-up, lotions, powders, perfumes/cologne, or deodorant  Do not wear nail polish including gel and S&S, artificial/acrylic nails, or any other type of covering on natural nails including finger and toenails. If you have artificial nails, gel coating, etc. that needs to be removed by a nail salon please have this removed prior to surgery or surgery may need to be canceled/ delayed if the surgeon/ anesthesia feels like they are unable to be safely monitored.   Do not shave  48 hours prior to surgery.               Men may shave face and neck.   Do not bring valuables to the hospital. [place].   Contacts, glasses, dentures or bridgework may not be worn into surgery.   Bring small overnight bag day of surgery.   DO NOT [place]. PHARMACY WILL DISPENSE MEDICATIONS LISTED ON YOUR MEDICATION LIST TO YOU DURING YOUR ADMISSION [place]!    Patients discharged on the day of surgery will not be allowed to drive home.  Someone NEEDS to stay with you for the first 24 hours after anesthesia.   Special Instructions: Bring a copy of your healthcare  power of attorney and living will documents the day of surgery if you haven't scanned them before.              Please read over the following fact sheets you were given: IF [place] [phone]   If you received a COVID test during your pre-op visit  it is requested that you wear a mask when out in public, stay away from anyone that may not be feeling well and notify your surgeon if you develop symptoms. If you test positive for Covid or have been in contact with anyone that has tested positive in the last 10 days please notify you surgeon.    [place] - Preparing for Surgery Before surgery, you can play an important role.  Because skin is not sterile, your skin needs to be as free of germs as possible.  You can reduce the number of germs on your skin by washing with CHG (chlorahexidine gluconate) soap before surgery.  CHG is an antiseptic cleaner which kills germs and bonds with the skin to continue killing germs even after washing. Please DO NOT use if you have an allergy to CHG or antibacterial soaps.  If your skin becomes reddened/irritated stop using the CHG and inform your nurse when you arrive at Short Stay. Do not shave (including legs and underarms) for at least 48 hours prior to the first CHG shower.  You may shave your face/neck. Please follow these instructions carefully:  1.  Shower with CHG Soap the night before surgery and the  morning of Surgery.  2.  If you choose to wash your hair, wash your hair first as usual with your  normal  shampoo.  3.  After you shampoo, rinse your hair and body thoroughly to remove the  shampoo.                           4.  Use CHG as you would any other liquid soap.  You can apply chg directly  to the skin and wash                       Gently with a scrungie or clean washcloth.  5.  Apply the CHG Soap to your body ONLY FROM THE NECK DOWN.   Do not use on face/ open                           Wound  or open sores. Avoid contact with eyes, ears mouth and genitals (private parts).                       Wash face,  Genitals (private parts) with your normal soap.             6.  Wash thoroughly, paying special attention to the area where your surgery  will be performed.  7.  Thoroughly rinse your body with warm water from the neck down.  8.  DO NOT shower/wash with your normal soap after using and rinsing off  the CHG Soap.                9.  Pat yourself dry with a clean towel.            10.  Wear clean pajamas.            11.  Place clean sheets on your bed the night of your first shower and do not  sleep with pets. Day of Surgery : Do not apply any lotions/deodorants the morning of surgery.  Please wear clean clothes to the hospital/surgery center.  FAILURE TO FOLLOW THESE INSTRUCTIONS MAY RESULT IN THE CANCELLATION OF YOUR SURGERY PATIENT SIGNATURE_________________________________  NURSE SIGNATURE__________________________________  ________________________________________________________________________

## 2022-04-05 ENCOUNTER — Encounter (HOSPITAL_COMMUNITY)
Admission: RE | Admit: 2022-04-05 | Discharge: 2022-04-05 | Disposition: A | Discharge status: Home / Self Care | Payer: Medicare HMO | Source: Ambulatory Visit | Attending: Internal Medicine | Admitting: Internal Medicine

## 2022-04-05 DIAGNOSIS — H04123 Dry eye syndrome of bilateral lacrimal glands: Secondary | ICD-10-CM | POA: Diagnosis not present

## 2022-04-07 LAB — COLOGUARD

## 2022-04-11 ENCOUNTER — Telehealth: Payer: Self-pay | Admitting: Cardiology

## 2022-04-11 ENCOUNTER — Telehealth: Payer: Self-pay

## 2022-04-11 NOTE — Telephone Encounter (Signed)
We received a Preoperative Risk Assessment request from [facility] for patient via fax.  I sent a copy of the request to Dr. Hulen Shouts folder on the S drive.  I hand-delivered a copy to Adair Laundry, [place].

## 2022-04-11 NOTE — Telephone Encounter (Signed)
   Pre-operative Risk Assessment    Patient Name: Brenda Leon  DOB: [birth year] MRN: [id number]      Request for Surgical Clearance    Procedure:  Lt partial knee arthroplasty  Date of Surgery:  Clearance 06/10/22                                 Surgeon:  Dr. Gaynelle Arabian Surgeon's Group or Practice Name:  [facility] Phone number:  [phone] Fax number:  [phone]   Type of Clearance Requested:   - Medical    Type of Anesthesia:  Not Indicated   Additional requests/questions:    Signed, Maxwell Caul   04/11/2022, 2:09 PM

## 2022-04-12 ENCOUNTER — Telehealth: Payer: Self-pay | Admitting: *Deleted

## 2022-04-12 ENCOUNTER — Ambulatory Visit: Payer: Medicare HMO | Admitting: Internal Medicine

## 2022-04-12 NOTE — Telephone Encounter (Signed)
Pt has been scheduled for a tele visit, 05/07/22 2:00.  Consent on file / medications reconciled.    Patient Consent for Virtual Visit        Brenda Leon has provided verbal consent on 04/12/2022 for a virtual visit (video or telephone).   CONSENT FOR VIRTUAL VISIT FOR:  Brenda Leon  By participating in this virtual visit I agree to the following:  I hereby voluntarily request, consent and authorize [facility] and its employed or contracted physicians, physician assistants, nurse practitioners or other licensed health care professionals (the Practitioner), to provide me with telemedicine health care services (the "Services") as deemed necessary by the treating Practitioner. I acknowledge and consent to receive the Services by the Practitioner via telemedicine. I understand that the telemedicine visit will involve communicating with the Practitioner through live audiovisual communication technology and the disclosure of certain medical information by electronic transmission. I acknowledge that I have been given the opportunity to request an in-person assessment or other available alternative prior to the telemedicine visit and am voluntarily participating in the telemedicine visit.  I understand that I have the right to withhold or withdraw my consent to the use of telemedicine in the course of my care at any time, without affecting my right to future care or treatment, and that the Practitioner or I may terminate the telemedicine visit at any time. I understand that I have the right to inspect all information obtained and/or recorded in the course of the telemedicine visit and may receive copies of available information for a reasonable fee.  I understand that some of the potential risks of receiving the Services via telemedicine include:  Delay or interruption in medical evaluation due to technological equipment failure or disruption; Information transmitted may not be sufficient  (e.g. poor resolution of images) to allow for appropriate medical decision making by the Practitioner; and/or  In rare instances, security protocols could fail, causing a breach of personal health information.  Furthermore, I acknowledge that it is my responsibility to provide information about my medical history, conditions and care that is complete and accurate to the best of my ability. I acknowledge that Practitioner's advice, recommendations, and/or decision may be based on factors not within their control, such as incomplete or inaccurate data provided by me or distortions of diagnostic images or specimens that may result from electronic transmissions. I understand that the practice of medicine is not an exact science and that Practitioner makes no warranties or guarantees regarding treatment outcomes. I acknowledge that a copy of this consent can be made available to me via my patient portal ([facility]), or I can request a printed copy by calling the office of [facility].    I understand that my insurance will be billed for this visit.   I have read or had this consent read to me. I understand the contents of this consent, which adequately explains the benefits and risks of the Services being provided via telemedicine.  I have been provided ample opportunity to ask questions regarding this consent and the Services and have had my questions answered to my satisfaction. I give my informed consent for the services to be provided through the use of telemedicine in my medical care

## 2022-04-12 NOTE — Telephone Encounter (Signed)
   Name: Brenda Leon  DOB: 1946/01/09  MRN: [id number]  Primary Cardiologist: Kate Sable, MD   Preoperative team, please contact this patient and set up a phone call appointment for further preoperative risk assessment. Please obtain consent and complete medication review. Thank you for your help.  I confirm that guidance regarding antiplatelet and oral anticoagulation therapy has been completed and, if necessary, noted below.  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   Ledora Bottcher, PA 04/12/2022, 10:38 AM [place]

## 2022-04-12 NOTE — Telephone Encounter (Signed)
Pt has been scheduled for a tele visit, 05/07/22 2:00.  Consent on file / medications reconciled.

## 2022-04-17 ENCOUNTER — Ambulatory Visit: Admit: 2022-04-17 | Payer: Medicare HMO | Admitting: Orthopedic Surgery

## 2022-04-17 SURGERY — ARTHROPLASTY, KNEE, UNICOMPARTMENTAL
Anesthesia: Choice | Site: Knee | Laterality: Left

## 2022-04-18 ENCOUNTER — Ambulatory Visit
Admission: RE | Admit: 2022-04-18 | Discharge: 2022-04-18 | Disposition: A | Discharge status: Home / Self Care | Payer: Medicare HMO | Source: Ambulatory Visit | Attending: Internal Medicine | Admitting: Internal Medicine

## 2022-04-18 DIAGNOSIS — Z1231 Encounter for screening mammogram for malignant neoplasm of breast: Secondary | ICD-10-CM

## 2022-04-19 DIAGNOSIS — Z1211 Encounter for screening for malignant neoplasm of colon: Secondary | ICD-10-CM | POA: Diagnosis not present

## 2022-04-22 ENCOUNTER — Encounter: Payer: Self-pay | Admitting: Internal Medicine

## 2022-04-29 LAB — COLOGUARD: COLOGUARD: NEGATIVE

## 2022-04-30 DIAGNOSIS — Z0100 Encounter for examination of eyes and vision without abnormal findings: Secondary | ICD-10-CM | POA: Diagnosis not present

## 2022-05-06 NOTE — Progress Notes (Unsigned)
Virtual Visit via Telephone Note   Because of Brenda Leon's co-morbid illnesses, she is at least at moderate risk for complications without adequate follow up.  This format is felt to be most appropriate for this patient at this time.  The patient did not have access to video technology/had technical difficulties with video requiring transitioning to audio format only (telephone).  All issues noted in this document were discussed and addressed.  No physical exam could be performed with this format.  Please refer to the patient's chart for her consent to telehealth for [facility].  Evaluation Performed:  Preoperative cardiovascular risk assessment _____________   Date:  05/06/2022   Patient ID:  Brenda Leon, DOB 1945-03-14, MRN [id number] Patient Location:  Home Provider location:   Office  Primary Care Provider:  Crecencio Mc, MD Primary Cardiologist:  Kate Sable, MD  Chief Complaint / Patient Profile   77 y.o. y/o female with a h/o hyperlipidemia, former smoker x 20 years GERD, SLE  who is pending left partial knee arthroplasty and presents today for telephonic preoperative cardiovascular risk assessment.  History of Present Illness    Brenda Leon is a 77 y.o. female who presents via audio/video conferencing for a telehealth visit today.  Pt was last seen in cardiology clinic on 01/29/2022 by Dr. Garen Lah.  At that time [name] was doing well with no cardiac complaints or chest discomfort.  Patient had not needed sublingual nitroglycerin felt that her symptoms were attributed to stress.  The patient is now pending procedure as outlined above. Since her last visit, she reports that she is doing well with no new cardiac complaints.  She is currently not having to use any nitroglycerin and physically she is only limited by her knee pain.  She denies chest pain, shortness of breath, lower extremity edema, fatigue, palpitations, melena, hematuria,  hemoptysis, diaphoresis, weakness, presyncope, syncope, orthopnea, and PND.     Past Medical History    Past Medical History:  Diagnosis Date   Anemia    Arthritis    Cataracts, bilateral    Chicken pox    Diverticulitis    Elevated blood pressure reading    GERD (gastroesophageal reflux disease)    Lupus (HCC)    SLE (systemic lupus erythematosus) (HCC)    diagnosed with skin biopsy and serology   Thyroid disease    Past Surgical History:  Procedure Laterality Date   ABDOMINAL HYSTERECTOMY  1987   APPENDECTOMY  1952   BREAST CYST ASPIRATION Right 25 + yrs ago   neg   CATARACT EXTRACTION, BILATERAL Bilateral 1995, [place]   x2 1995 and [place] ARTHROSCOPY W/ ACL RECONSTRUCTION     STRABISMUS SURGERY Bilateral 09/12/2017   Procedure: BILATERAL STRABISMUS REPAIR;  Surgeon: Everitt Amber, MD;  Location: [facility];  Service: Ophthalmology;  Laterality: Bilateral;   TONSILLECTOMY AND ADENOIDECTOMY      Allergies  No Known Allergies  Home Medications    Prior to Admission medications   Medication Sig Start Date End Date Taking? Authorizing Provider  acetaminophen (TYLENOL) 650 MG CR tablet Take 1,300 mg by mouth in the morning and at bedtime.    [provider]  ALPRAZolam (XANAX) 0.25 MG tablet TAKE 1 TABLET BY MOUTH AT BEDTIME AS NEEDED FOR ANXIETY Patient taking differently: Take 0.25 mg by mouth at bedtime as needed for anxiety or sleep. 09/28/21   Crecencio Mc,  MD  aspirin EC 81 MG tablet Take 1 tablet (81 mg total) by mouth daily. Swallow whole. 10/04/21   Kate Sable, MD  atorvastatin (LIPITOR) 20 MG tablet Take 1 tablet by mouth once daily 08/27/21   Crecencio Mc, MD  b complex vitamins tablet Take 1 tablet by mouth daily.    [provider]  Cholecalciferol (VITAMIN D3) 50 MCG (2000 UT) TABS Take 4,000 Units by mouth daily.    [provider]  COLLAGEN PO Take 1 Scoop by mouth daily.     [provider]  Flaxseed, Linseed, (FLAX SEED OIL PO) Take 1 capsule by mouth daily.    [provider]  Horse Chestnut 300 MG CAPS Take 300 mg by mouth daily.    [provider]  hydroxychloroquine (PLAQUENIL) 200 MG tablet Take 200 mg by mouth 2 (two) times daily. 05/17/20   [provider]  MAGNESIUM GLUCONATE PO Take 1 tablet by mouth daily.    [provider]  Multiple Vitamins-Minerals (PRESERVISION AREDS 2+MULTI VIT PO) Take 1 capsule by mouth daily.    [provider]  Omeprazole 20 MG TBEC Take 20 mg by mouth daily. 06/08/21   [provider]  traMADol (ULTRAM) 50 MG tablet Take 1 tablet (50 mg total) by mouth daily as needed for moderate pain or severe pain. 01/11/22   Crecencio Mc, MD  TYRVAYA 0.03 MG/ACT SOLN Place 1 spray into both nostrils in the morning and at bedtime. 03/22/22   [provider]    Physical Exam    Vital Signs:  AAMORI Leon does not have vital signs available for review today.  Given telephonic nature of communication, physical exam is limited. AAOx3. NAD. Normal affect.  Speech and respirations are unlabored.  Accessory Clinical Findings    None  Assessment & Plan    1.  Preoperative Cardiovascular Risk Assessment:   [name] perioperative risk of a major cardiac event is 0.9% according to the Revised Cardiac Risk Index (RCRI).  Therefore, she is at low risk for perioperative complications.   Her functional capacity is fair at 4.64 METs according to the Duke Activity Status Index (DASI). Recommendations: According to ACC/AHA guidelines, no further cardiovascular testing needed.  The patient may proceed to surgery at acceptable risk.   Antiplatelet and/or Anticoagulation Recommendations: Aspirin can be held for 7 days prior to her surgery.  Please resume Aspirin post operatively when it is felt to be safe from a bleeding standpoint.   The patient was advised that if she  develops new symptoms prior to surgery to contact our office to arrange for a follow-up visit, and she verbalized understanding.  A copy of this note will be routed to requesting surgeon.  Time:   Today, I have spent 6 minutes with the patient with telehealth technology discussing medical history, symptoms, and management plan.     Mable Fill, Marissa Nestle, NP  05/06/2022, 1:09 PM

## 2022-05-07 ENCOUNTER — Ambulatory Visit: Payer: Medicare HMO | Attending: Cardiovascular Disease

## 2022-05-07 DIAGNOSIS — Z01810 Encounter for preprocedural cardiovascular examination: Secondary | ICD-10-CM | POA: Diagnosis not present

## 2022-05-10 ENCOUNTER — Ambulatory Visit (INDEPENDENT_AMBULATORY_CARE_PROVIDER_SITE_OTHER): Payer: Medicare HMO | Admitting: Internal Medicine

## 2022-05-10 ENCOUNTER — Encounter: Payer: Self-pay | Admitting: Internal Medicine

## 2022-05-10 VITALS — BP 128/70 | HR 90 | Temp 98.0°F | Ht 67.5 in | Wt 207.4 lb

## 2022-05-10 DIAGNOSIS — R69 Illness, unspecified: Secondary | ICD-10-CM | POA: Diagnosis not present

## 2022-05-10 DIAGNOSIS — R419 Unspecified symptoms and signs involving cognitive functions and awareness: Secondary | ICD-10-CM

## 2022-05-10 DIAGNOSIS — M328 Other forms of systemic lupus erythematosus: Secondary | ICD-10-CM | POA: Diagnosis not present

## 2022-05-10 DIAGNOSIS — Z01818 Encounter for other preprocedural examination: Secondary | ICD-10-CM

## 2022-05-10 DIAGNOSIS — Z630 Problems in relationship with spouse or partner: Secondary | ICD-10-CM

## 2022-05-10 DIAGNOSIS — R5383 Other fatigue: Secondary | ICD-10-CM | POA: Diagnosis not present

## 2022-05-10 DIAGNOSIS — I700 Atherosclerosis of aorta: Secondary | ICD-10-CM | POA: Diagnosis not present

## 2022-05-10 MED ORDER — ATORVASTATIN CALCIUM 20 MG PO TABS: 20.0000 mg | ORAL_TABLET | Freq: Every day | ORAL | 3 refills | Status: DC

## 2022-05-10 NOTE — Patient Instructions (Signed)
  Your memory is FINE!   I highly recommend reading "the End of Alzheimer's: The First Program to Prevent and Reverse Cognitive Decline"  by Brent Bulla, MD     Please ask [name] to schedule an appointment

## 2022-05-10 NOTE — Progress Notes (Unsigned)
Subjective:  Patient ID: Brenda Leon, female    DOB: 01/01/1946  Age: 77 y.o. MRN: [id number]  CC: There were no encounter diagnoses.   HPI Brenda Leon presents for  Chief Complaint  Patient presents with   discuss supplements      Outpatient Medications Prior to Visit  Medication Sig Dispense Refill   acetaminophen (TYLENOL) 650 MG CR tablet Take 1,300 mg by mouth in the morning and at bedtime.     ALPRAZolam (XANAX) 0.25 MG tablet TAKE 1 TABLET BY MOUTH AT BEDTIME AS NEEDED FOR ANXIETY (Patient taking differently: Take 0.25 mg by mouth at bedtime as needed for anxiety or sleep.) 30 tablet 5   aspirin EC 81 MG tablet Take 1 tablet (81 mg total) by mouth daily. Swallow whole.     b complex vitamins tablet Take 1 tablet by mouth daily.     Cholecalciferol (VITAMIN D3) 50 MCG (2000 UT) TABS Take 4,000 Units by mouth daily.     COLLAGEN PO Take 1 Scoop by mouth daily.     Flaxseed, Linseed, (FLAX SEED OIL PO) Take 1 capsule by mouth daily.     Horse Chestnut 300 MG CAPS Take 300 mg by mouth daily.     hydroxychloroquine (PLAQUENIL) 200 MG tablet Take 200 mg by mouth 2 (two) times daily.     MAGNESIUM GLUCONATE PO Take 1 tablet by mouth daily.     Multiple Vitamins-Minerals (PRESERVISION AREDS 2+MULTI VIT PO) Take 1 capsule by mouth daily.     Omeprazole 20 MG TBEC Take 20 mg by mouth daily.     traMADol (ULTRAM) 50 MG tablet Take 1 tablet (50 mg total) by mouth daily as needed for moderate pain or severe pain. 30 tablet 2   TYRVAYA 0.03 MG/ACT SOLN Place 1 spray into both nostrils in the morning and at bedtime.     atorvastatin (LIPITOR) 20 MG tablet Take 1 tablet by mouth once daily 90 tablet 2   No facility-administered medications prior to visit.    Review of Systems;  Patient denies headache, fevers, malaise, unintentional weight loss, skin rash, eye pain, sinus congestion and sinus pain, sore throat, dysphagia,  hemoptysis , cough, dyspnea, wheezing, chest pain,  palpitations, orthopnea, edema, abdominal pain, nausea, melena, diarrhea, constipation, flank pain, dysuria, hematuria, urinary  Frequency, nocturia, numbness, tingling, seizures,  Focal weakness, Loss of consciousness,  Tremor, insomnia, depression, anxiety, and suicidal ideation.      Objective:  BP 134/60   Pulse 90   Temp 98 F (36.7 C) (Oral)   Ht 5' 7.5" (1.715 m)   Wt 207 lb 6.4 oz (94.1 kg)   SpO2 95%   BMI 32.00 kg/m   BP Readings from Last 3 Encounters:  05/10/22 134/60  01/29/22 134/68  10/25/21 138/74    Wt Readings from Last 3 Encounters:  05/10/22 207 lb 6.4 oz (94.1 kg)  03/22/22 207 lb (93.9 kg)  01/29/22 207 lb 9.6 oz (94.2 kg)    Physical Exam  Lab Results  Component Value Date   HGBA1C 5.9 06/28/2021   HGBA1C 6.0 06/27/2020    Lab Results  Component Value Date   CREATININE 0.81 10/04/2021   CREATININE 0.80 06/28/2021   CREATININE 0.75 04/24/2021    Lab Results  Component Value Date   WBC 5.9 04/24/2021   HGB 12.4 04/24/2021   HCT 37.0 04/24/2021   PLT 224.0 04/24/2021   GLUCOSE 100 (H) 10/04/2021   CHOL 148 10/04/2021  TRIG 91 10/04/2021   HDL 55 10/04/2021   LDLDIRECT 79 10/04/2021   LDLCALC 75 10/04/2021   ALT 18 06/28/2021   AST 18 06/28/2021   NA 142 10/04/2021   K 4.2 10/04/2021   CL 106 10/04/2021   CREATININE 0.81 10/04/2021   BUN 19 10/04/2021   CO2 28 10/04/2021   TSH 2.55 06/27/2020   HGBA1C 5.9 06/28/2021   MICROALBUR <0.7 06/27/2020    Mammogram 3D SCREEN BREAST BILATERAL  Result Date: 04/23/2022 CLINICAL DATA:  Screening. EXAM: DIGITAL SCREENING BILATERAL MAMMOGRAM WITH TOMOSYNTHESIS AND CAD TECHNIQUE: Bilateral screening digital craniocaudal and mediolateral oblique mammograms were obtained. Bilateral screening digital breast tomosynthesis was performed. The images were evaluated with computer-aided detection. COMPARISON:  Previous exam(s). ACR Breast Density Category b: There are scattered areas of fibroglandular  density. FINDINGS: There are no findings suspicious for malignancy. IMPRESSION: No mammographic evidence of malignancy. A result letter of this screening mammogram will be mailed directly to the patient. RECOMMENDATION: Screening mammogram in one year. (Code:SM-B-01Y) BI-RADS CATEGORY  1: Negative. Electronically Signed   By: Valentino Saxon M.D.   On: 04/23/2022 13:17    Assessment & Plan:  .There are no diagnoses linked to this encounter.   I provided 30 minutes of face-to-face time during this encounter reviewing patient's last visit with me, patient's  most recent visit with cardiology,  nephrology,  and neurology,  recent surgical and non surgical procedures, previous  labs and imaging studies, counseling on currently addressed issues,  and post visit ordering to diagnostics and therapeutics .   Follow-up: No follow-ups on file.   Crecencio Mc, MD

## 2022-05-11 LAB — COMPREHENSIVE METABOLIC PANEL
AG Ratio: 1.7 (calc) (ref 1.0–2.5)
ALT: 19 U/L (ref 6–29)
AST: 18 U/L (ref 10–35)
Albumin: 4.4 g/dL (ref 3.6–5.1)
Alkaline phosphatase (APISO): 55 U/L (ref 37–153)
BUN: 23 mg/dL (ref 7–25)
CO2: 26 mmol/L (ref 20–32)
Calcium: 9.5 mg/dL (ref 8.6–10.4)
Chloride: 104 mmol/L (ref 98–110)
Creat: 0.64 mg/dL (ref 0.60–1.00)
Globulin: 2.6 g/dL (calc) (ref 1.9–3.7)
Glucose, Bld: 134 mg/dL — ABNORMAL HIGH (ref 65–99)
Potassium: 3.8 mmol/L (ref 3.5–5.3)
Sodium: 141 mmol/L (ref 135–146)
Total Bilirubin: 0.3 mg/dL (ref 0.2–1.2)
Total Protein: 7 g/dL (ref 6.1–8.1)

## 2022-05-11 LAB — HEMOGLOBIN A1C
Hgb A1c MFr Bld: 5.9 % of total Hgb — ABNORMAL HIGH (ref <5.7)
Mean Plasma Glucose: 123 mg/dL
eAG (mmol/L): 6.8 mmol/L

## 2022-05-11 LAB — B12 AND FOLATE PANEL
Folate: >24 ng/mL
Vitamin B-12: 541 pg/mL (ref 200–1100)

## 2022-05-11 LAB — TSH: TSH: 1.36 mIU/L (ref 0.40–4.50)

## 2022-05-12 DIAGNOSIS — R419 Unspecified symptoms and signs involving cognitive functions and awareness: Secondary | ICD-10-CM | POA: Insufficient documentation

## 2022-05-12 NOTE — Assessment & Plan Note (Signed)
Stable disease on Placquenil .  Reviewed Last app with Rheumatolgy reviewed  Posey Pronto,  Nunzio Cory Harlow Mares  Feb 2023) :  She is taking the Plaquenil. She is tolerating the medication well. She has no new joint pains or swelling. The dry mouth is stable. She does get aching pain of the CMC joints and DIP joints. She has no fever or infection or pleurisy or dyspnea, malar rash, raynaud's, muscle weakness, nasal/oral ulcer or alopecia.

## 2022-05-12 NOTE — Assessment & Plan Note (Signed)
counselling given today to patient .  She has an unhappy marriage to a narcissist.   A total of 32 minutes of face to face time was spent with patient more than half of which was spent in counselling and encouraged her to engage in activities that improve their communication, compromise and companionship

## 2022-05-12 NOTE — Assessment & Plan Note (Addendum)
Her memory and calculation skills are excellent based on  MMSE done today.  Advised her that her symptoms are likely due to the frustration and dysfunction of her marriage and relationship to Mona.

## 2022-05-12 NOTE — Assessment & Plan Note (Signed)
Incidental finding on CT angiogram done during ER evaluation  In Nov 2020 for COVID RELATED respiratory failure, again seen on July 2023 chest x ray.  She has been taking statin therapy since February 2023 with good tolerance  .   Lab Results  Component Value Date   CHOL 148 10/04/2021   HDL 55 10/04/2021   LDLCALC 75 10/04/2021   LDLDIRECT 79 10/04/2021   TRIG 91 10/04/2021   CHOLHDL 2.7 10/04/2021

## 2022-05-21 NOTE — H&P (Signed)
TOTAL KNEE ADMISSION H&P  Patient is being admitted for left total knee arthroplasty.  Subjective:  Chief Complaint: Left knee pain.  HPI: Brenda Leon, 77 y.o. female has a history of pain and functional disability in the left knee due to arthritis and has failed non-surgical conservative treatments for greater than 12 weeks to include NSAID's and/or analgesics, corticosteriod injections, viscosupplementation injections, and activity modification. Onset of symptoms was gradual, starting several years ago with gradually worsening course since that time. The patient noted no past surgery on the left knee.  Patient currently rates pain in the left knee at 7 out of 10 with activity. Patient has night pain, worsening of pain with activity and weight bearing, and pain that interferes with activities of daily living. Patient has evidence of  bone-on-bone arthritis in the medial compartment. She has a well preserved lateral and patellofemoral compartments  by imaging studies. There is no active infection.  Patient Active Problem List   Diagnosis Date Noted   Cognitive complaints with normal neuropsychological exam 05/12/2022   Atypical chest pain 09/25/2021   Aortic atherosclerosis ([place]) 09/19/2021   Pain in joint of left knee 06/08/2021   Gastritis due to nonsteroidal anti-inflammatory drug 06/07/2021   Osteoarthritis of left knee 04/25/2021   Marital stress 02/28/2021   Primary osteoarthritis of both hands 12/14/2019   Thoracic aortic atherosclerosis ([place]) 01/29/2019   Personal history of COVID-19 01/10/2019   Functional systolic murmur [id number]   Hip pain 09/03/2015   Varicose veins of lower extremity with edema, bilateral 07/02/2013   Postmenopausal atrophic vaginitis 05/24/2012   Routine general medical examination at a health care facility 05/24/2012   Leg pain, diffuse 02/10/2012   SLE (systemic lupus erythematosus) ([place]) 02/10/2012   GERD (gastroesophageal reflux disease)  02/10/2012   Hyperlipidemia with target LDL less than 130 02/10/2012    Past Medical History:  Diagnosis Date   Anemia    Arthritis    Cataracts, bilateral    Chicken pox    Diverticulitis    Elevated blood pressure reading    GERD (gastroesophageal reflux disease)    Lupus (HCC)    SLE (systemic lupus erythematosus) ([place])    diagnosed with skin biopsy and serology   Thyroid disease     Past Surgical History:  Procedure Laterality Date   [place] Right 25 + yrs ago   neg   CATARACT EXTRACTION, BILATERAL Bilateral 1995, [place]   x2 1995 and [place] ARTHROSCOPY W/ ACL RECONSTRUCTION     STRABISMUS SURGERY Bilateral 09/12/2017   Procedure: BILATERAL STRABISMUS REPAIR;  Surgeon: Everitt Amber, MD;  Location: [facility];  Service: Ophthalmology;  Laterality: Bilateral;   TONSILLECTOMY AND ADENOIDECTOMY      Prior to Admission medications   Medication Sig Start Date End Date Taking? Authorizing Provider  acetaminophen (TYLENOL) 650 MG CR tablet Take 1,300 mg by mouth in the morning and at bedtime.    [provider]  ALPRAZolam (XANAX) 0.25 MG tablet TAKE 1 TABLET BY MOUTH AT BEDTIME AS NEEDED FOR ANXIETY Patient taking differently: Take 0.25 mg by mouth at bedtime as needed for anxiety or sleep. 09/28/21   Crecencio Mc, MD  aspirin EC 81 MG tablet Take 1 tablet (81 mg total) by mouth daily. Swallow whole. 10/04/21   Kate Sable, MD  atorvastatin (LIPITOR) 20 MG tablet Take 1 tablet (20  mg total) by mouth daily. 05/10/22   Crecencio Mc, MD  b complex vitamins tablet Take 1 tablet by mouth daily.    [provider]  Cholecalciferol (VITAMIN D3) 50 MCG (2000 UT) TABS Take 4,000 Units by mouth daily.    [provider]  COLLAGEN PO Take 1 Scoop by mouth daily.    [provider]  Flaxseed, Linseed, (FLAX SEED OIL PO) Take 1 capsule by  mouth daily.    [provider]  [place] 300 MG CAPS Take 300 mg by mouth daily.    [provider]  hydroxychloroquine (PLAQUENIL) 200 MG tablet Take 200 mg by mouth 2 (two) times daily. 05/17/20   [provider]  MAGNESIUM GLUCONATE PO Take 1 tablet by mouth daily.    [provider]  Multiple Vitamins-Minerals (PRESERVISION AREDS 2+MULTI VIT PO) Take 1 capsule by mouth daily.    [provider]  Omeprazole 20 MG TBEC Take 20 mg by mouth daily. 06/08/21   [provider]  traMADol (ULTRAM) 50 MG tablet Take 1 tablet (50 mg total) by mouth daily as needed for moderate pain or severe pain. 01/11/22   Crecencio Mc, MD  TYRVAYA 0.03 MG/ACT SOLN Place 1 spray into both nostrils in the morning and at bedtime. 03/22/22   [provider]    No Known Allergies  Social History   Socioeconomic History   Marital status: Married    Spouse name: Not on file   Number of children: Not on file   Years of education: Not on file   Highest education level: Not on file  Occupational History   Not on file  Tobacco Use   Smoking status: Former    Types: Cigarettes    Quit date: 02/25/1986    Years since quitting: 36.2   Smokeless tobacco: Never  Vaping Use   Vaping Use: Never used  Substance and Sexual Activity   Alcohol use: Not Currently    Alcohol/week: 1.0 standard drink of alcohol    Types: 1 Shots of liquor per week    Comment: vodka tonic daily   Drug use: No   Sexual activity: Yes  Other Topics Concern   Not on file  Social History Narrative   Not on file   Social Determinants of Health   Financial Resource Strain: Low Risk  (03/22/2022)   Overall Financial Resource Strain (CARDIA)    Difficulty of Paying Living Expenses: Not very hard  Food Insecurity: No Food Insecurity (03/22/2022)   Hunger Vital Sign    Worried About Running Out of Food in the Last Year: Never true    Ran Out of Food in the Last Year: Never  true  Transportation Needs: No Transportation Needs (03/22/2022)   PRAPARE - Hydrologist (Medical): No    Lack of Transportation (Non-Medical): No  Physical Activity: Inactive (03/22/2021)   Exercise Vital Sign    Days of Exercise per Week: 0 days    Minutes of Exercise per Session: 0 min  Stress: Stress Concern Present (03/22/2022)   [place]    Feeling of Stress : To some extent  Social Connections: Moderately Isolated (03/22/2022)   Social Connection and Isolation Panel [NHANES]    Frequency of Communication with Friends and Family: Once a week    Frequency of Social Gatherings with Friends and Family: Once a week    Attends Religious Services: Never  Active Member of Clubs or Organizations: Yes    Attends Archivist Meetings: Not on file    Marital Status: Married  Intimate Partner Violence: Not At Risk (03/22/2022)   Humiliation, Afraid, Rape, and Kick questionnaire    Fear of Current or Ex-Partner: No    Emotionally Abused: No    Physically Abused: No    Sexually Abused: No    Tobacco Use: Medium Risk (05/10/2022)   Patient History    Smoking Tobacco Use: Former    Smokeless Tobacco Use: Never    Passive Exposure: Not on file   Social History   Substance and Sexual Activity  Alcohol Use Not Currently   Alcohol/week: 1.0 standard drink of alcohol   Types: 1 Shots of liquor per week   Comment: vodka tonic daily    Family History  Problem Relation Age of Onset   Aneurysm Mother        AAA   Other Mother        varicose veins   Hypertension Father    Heart disease Father    Heart attack Father    Diabetes Sister    Hyperlipidemia Sister    Hypertension Sister    Kidney disease Sister    Hyperlipidemia Brother    Hypertension Son    Hyperlipidemia Son    Hyperlipidemia Sister    Breast cancer Neg Hx     Review of Systems  Constitutional:  Negative for  chills and fever.  HENT: Negative.    Eyes: Negative.   Respiratory:  Negative for cough and shortness of breath.   Cardiovascular:  Negative for chest pain and palpitations.  Gastrointestinal:  Negative for abdominal pain, nausea and vomiting.  Genitourinary:  Negative for dysuria, frequency and urgency.  Musculoskeletal:  Positive for joint pain.  Skin:  Negative for rash.   Objective:  Physical Exam: Well nourished and well developed.  General: Alert and oriented x3, cooperative and pleasant, no acute distress.  Head: normocephalic, atraumatic, neck supple.  Eyes: EOMI. Abdomen: non-tender to palpation and soft, normoactive bowel sounds. Musculoskeletal:  Left Knee Exam:  No effusion present. No swelling present.  The range of motion is: 5 to 125 degrees.  No crepitus on range of motion of the knee.  Positive medial joint line tenderness.  No lateral joint line tenderness.  The knee is stable.   Calves soft and nontender. Motor function intact in LE. Strength 5/5 LE bilaterally. Neuro: Distal pulses 2+. Sensation to light touch intact in LE.  Vital signs in last 24 hours: BP: ()/()  Arterial Line BP: ()/()   Imaging Review Plain radiographs demonstrate moderate degenerative joint disease of the left knee. The overall alignment is neutral. The bone quality appears to be adequate for age and reported activity level.  Assessment/Plan:  End stage arthritis, left knee   The patient history, physical examination, clinical judgment of the provider and imaging studies are consistent with end stage degenerative joint disease of the left knee and total knee arthroplasty is deemed medically necessary. The treatment options including medical management, injection therapy arthroscopy and arthroplasty were discussed at length. The risks and benefits of total knee arthroplasty were presented and reviewed. The risks due to aseptic loosening, infection, stiffness, patella tracking  problems, thromboembolic complications and other imponderables were discussed. The patient acknowledged the explanation, agreed to proceed with the plan and consent was signed. Patient is being admitted for inpatient treatment for surgery, pain control, PT, OT, prophylactic antibiotics, VTE prophylaxis,  progressive ambulation and ADLs and discharge planning. The patient is planning to be discharged  home .  Therapy Plans: EO Disposition: Home with Husband Planned DVT Prophylaxis: Xarelto 10 mg DME Needed: RW PCP: Deborra Medina, MD (clearance received) Cardiologist: Rebekah Chesterfield., MD (clearance received) TXA: IV Allergies: NKDA Anesthesia Concerns: Sjogren's (dry mouth) BMI: 32.2 Last HgbA1c: not diabetic  Pharmacy: [facility] ([place])  Other: -Significant reflux/GI upset with NSAIDs -Takes Tramadol 50mg  usually 1-2 times per week  - Patient was instructed on what medications to stop prior to surgery. - Follow-up visit in 2 weeks with Dr. Wynelle Link - Begin physical therapy following surgery - Pre-operative lab work as pre-surgical testing - Prescriptions will be provided in hospital at time of discharge  R. Jaynie Bream, PA-C Orthopedic Surgery [facility] [place]

## 2022-05-23 NOTE — Progress Notes (Signed)
Surgery orders requested via Epic inbox. °

## 2022-05-27 ENCOUNTER — Encounter: Payer: Self-pay | Admitting: Internal Medicine

## 2022-05-27 NOTE — Patient Instructions (Signed)
SURGICAL WAITING ROOM VISITATION Patients having surgery or a procedure may have no more than 2 support people in the waiting area - these visitors may rotate.    Children under the age of 64 must have an adult with them who is not the patient.  If the patient needs to stay at the hospital during part of their recovery, the visitor guidelines for inpatient rooms apply. Pre-op nurse will coordinate an appropriate time for 1 support person to accompany patient in pre-op.  This support person may not rotate.    Please refer to the [facility] website for the visitor guidelines for Inpatients (after your surgery is over and you are in a regular room).       Your procedure is scheduled on: 06-10-22   Report to [facility] Main Entrance    Report to admitting at 12:45 PM   Call this number if you have problems the morning of surgery [phone]   Do not eat food :After Midnight.   After Midnight you may have the following liquids until 12:15 PM DAY OF SURGERY  Water Non-Citrus Juices (without pulp, NO RED) Carbonated Beverages Black Coffee (NO MILK/CREAM OR CREAMERS, sugar ok)  Clear Tea (NO MILK/CREAM OR CREAMERS, sugar ok) regular and decaf                             Plain Jell-O (NO RED)                                           Fruit ices (not with fruit pulp, NO RED)                                     Popsicles (NO RED)                                                               Sports drinks like Gatorade (NO RED)                   The day of surgery:  Drink ONE (1) Pre-Surgery G2 at 12:15 PM the morning of surgery. Drink in one sitting. Do not sip.  This drink was given to you during your hospital  pre-op appointment visit. Nothing else to drink after completing the Pre-Surgery G2.          If you have questions, please contact your surgeon's office.   FOLLOW ANY ADDITIONAL PRE OP INSTRUCTIONS YOU RECEIVED FROM YOUR SURGEON'S OFFICE!!!     Oral Hygiene is  also important to reduce your risk of infection.                                    Remember - BRUSH YOUR TEETH THE MORNING OF SURGERY WITH YOUR REGULAR TOOTHPASTE   Do NOT smoke after Midnight   Take these medicines the morning of surgery with A SIP OF WATER:   Atorvastatin  Omeprazole  Tramadol if needed  Tylenol if  needed  Okay to use eyedrops                              You may not have any metal on your body including hair pins, jewelry, and body piercing             Do not wear make-up, lotions, powders, perfumes or deodorant  Do not wear nail polish including gel and S&S, artificial/acrylic nails, or any other type of covering on natural nails including finger and toenails. If you have artificial nails, gel coating, etc. that needs to be removed by a nail salon please have this removed prior to surgery or surgery may need to be canceled/ delayed if the surgeon/ anesthesia feels like they are unable to be safely monitored.   Do not shave  48 hours prior to surgery.    Do not bring valuables to the hospital. [place].   Contacts, dentures or bridgework may not be worn into surgery.   Bring small overnight bag day of surgery.   DO NOT [place]. PHARMACY WILL DISPENSE MEDICATIONS LISTED ON YOUR MEDICATION LIST TO YOU DURING YOUR ADMISSION [place]!              Please read over the following fact sheets you were given: IF [place]  If you received a COVID test during your pre-op visit  it is requested that you wear a mask when out in public, stay away from anyone that may not be feeling well and notify your surgeon if you develop symptoms. If you test positive for Covid or have been in contact with anyone that has tested positive in the last 10 days please notify you surgeon.    Incentive Spirometer  An incentive spirometer is  a tool that can help keep your lungs clear and active. This tool measures how well you are filling your lungs with each breath. Taking long deep breaths may help reverse or decrease the chance of developing breathing (pulmonary) problems (especially infection) following: A long period of time when you are unable to move or be active. BEFORE THE PROCEDURE  If the spirometer includes an indicator to show your best effort, your nurse or respiratory therapist will set it to a desired goal. If possible, sit up straight or lean slightly forward. Try not to slouch. Hold the incentive spirometer in an upright position. INSTRUCTIONS FOR USE  Sit on the edge of your bed if possible, or sit up as far as you can in bed or on a chair. Hold the incentive spirometer in an upright position. Breathe out normally. Place the mouthpiece in your mouth and seal your lips tightly around it. Breathe in slowly and as deeply as possible, raising the piston or the ball toward the top of the column. Hold your breath for 3-5 seconds or for as long as possible. Allow the piston or ball to fall to the bottom of the column. Remove the mouthpiece from your mouth and breathe out normally. Rest for a few seconds and repeat Steps 1 through 7 at least 10 times every 1-2 hours when you are awake. Take your time and take a few normal breaths between deep breaths. The spirometer may include an indicator to show your best effort. Use the indicator as a goal to work toward  during each repetition. After each set of 10 deep breaths, practice coughing to be sure your lungs are clear. If you have an incision (the cut made at the time of surgery), support your incision when coughing by placing a pillow or rolled up towels firmly against it. Once you are able to get out of bed, walk around indoors and cough well. You may stop using the incentive spirometer when instructed by your caregiver.  RISKS AND COMPLICATIONS Take your time so you do not  get dizzy or light-headed. If you are in pain, you may need to take or ask for pain medication before doing incentive spirometry. It is harder to take a deep breath if you are having pain. AFTER USE Rest and breathe slowly and easily. It can be helpful to keep track of a log of your progress. Your caregiver can provide you with a simple table to help with this. If you are using the spirometer at home, follow these instructions: [place] IF:  You are having difficultly using the spirometer. You have trouble using the spirometer as often as instructed. Your pain medication is not giving enough relief while using the spirometer. You develop fever of 100.5 F (38.1 C) or higher. SEEK IMMEDIATE MEDICAL CARE IF:  You cough up bloody sputum that had not been present before. You develop fever of 102 F (38.9 C) or greater. You develop worsening pain at or near the incision site. MAKE SURE YOU:  Understand these instructions. Will watch your condition. Will get help right away if you are not doing well or get worse. Document Released: 06/24/2006 Document Revised: 05/06/2011 Document Reviewed: 08/25/2006 [facility] Patient Information 2014 [place], [place].   ________________________________________________________________________

## 2022-05-27 NOTE — Progress Notes (Signed)
COVID Vaccine Completed:  Yes  Date of COVID positive in last 90 days:  PCP - Satira Sark, MD Cardiologist - Kate Sable, MD  Cardiac clearance in Epic dated 05-06-22 by Ambrose Pancoast, NP  Medical clearance on chart dated 05-10-22  Chest x-ray - 09-19-21 Epic EKG - 10-25-21 Epic Stress Test -  ECHO - 10-18-21 Epic Cardiac Cath -  Pacemaker/ICD device last checked: Spinal Cord Stimulator: Coronary CT - 10-11-21 Epic  Bowel Prep -   Sleep Study -  CPAP -   Fasting Blood Sugar -  Checks Blood Sugar _____ times a day  Last dose of GLP1 agonist-  N/A GLP1 instructions:  N/A   Last dose of SGLT-2 inhibitors-  N/A SGLT-2 instructions: N/A   Blood Thinner Instructions: Aspirin Instructions:  ASA 81.  Okay to hold x7 days per cardiology. Last Dose:  Activity level:  Can go up a flight of stairs and perform activities of daily living without stopping and without symptoms of chest pain or shortness of breath.  Able to exercise without symptoms  Unable to go up a flight of stairs without symptoms of     Anesthesia review:  Chest pain evaluated by cardiology.  Lupus  Patient denies shortness of breath, fever, cough and chest pain at PAT appointment  Patient verbalized understanding of instructions that were given to them at the PAT appointment. Patient was also instructed that they will need to review over the PAT instructions again at home before surgery.

## 2022-05-28 DIAGNOSIS — M19041 Primary osteoarthritis, right hand: Secondary | ICD-10-CM | POA: Diagnosis not present

## 2022-05-28 DIAGNOSIS — Z79899 Other long term (current) drug therapy: Secondary | ICD-10-CM | POA: Diagnosis not present

## 2022-05-28 DIAGNOSIS — M329 Systemic lupus erythematosus, unspecified: Secondary | ICD-10-CM | POA: Diagnosis not present

## 2022-05-28 DIAGNOSIS — M19042 Primary osteoarthritis, left hand: Secondary | ICD-10-CM | POA: Diagnosis not present

## 2022-05-30 ENCOUNTER — Encounter (HOSPITAL_COMMUNITY): Payer: Self-pay

## 2022-05-30 ENCOUNTER — Encounter (HOSPITAL_COMMUNITY)
Admission: RE | Admit: 2022-05-30 | Discharge: 2022-05-30 | Disposition: A | Discharge status: Home / Self Care | Payer: Medicare HMO | Source: Ambulatory Visit | Attending: Orthopedic Surgery | Admitting: Orthopedic Surgery

## 2022-05-30 ENCOUNTER — Other Ambulatory Visit: Payer: Self-pay

## 2022-05-30 VITALS — BP 162/98 | HR 76 | Temp 98.4°F | Resp 20 | Ht 67.5 in | Wt 204.0 lb

## 2022-05-30 DIAGNOSIS — Z01812 Encounter for preprocedural laboratory examination: Secondary | ICD-10-CM | POA: Insufficient documentation

## 2022-05-30 DIAGNOSIS — I2510 Atherosclerotic heart disease of native coronary artery without angina pectoris: Secondary | ICD-10-CM

## 2022-05-30 DIAGNOSIS — M329 Systemic lupus erythematosus, unspecified: Secondary | ICD-10-CM | POA: Diagnosis not present

## 2022-05-30 DIAGNOSIS — M1712 Unilateral primary osteoarthritis, left knee: Secondary | ICD-10-CM | POA: Insufficient documentation

## 2022-05-30 DIAGNOSIS — R7303 Prediabetes: Secondary | ICD-10-CM

## 2022-05-30 DIAGNOSIS — I10 Essential (primary) hypertension: Secondary | ICD-10-CM | POA: Diagnosis not present

## 2022-05-30 DIAGNOSIS — Z01818 Encounter for other preprocedural examination: Secondary | ICD-10-CM

## 2022-05-30 HISTORY — DX: Essential (primary) hypertension: I10

## 2022-05-30 LAB — CBC
HCT: 38.6 % (ref 36.0–46.0)
Hemoglobin: 12.3 g/dL (ref 12.0–15.0)
MCH: 30.4 pg (ref 26.0–34.0)
MCHC: 31.9 g/dL (ref 30.0–36.0)
MCV: 95.5 fL (ref 80.0–100.0)
Platelets: 237 10*3/uL (ref 150–400)
RBC: 4.04 MIL/uL (ref 3.87–5.11)
RDW: 12.8 % (ref 11.5–15.5)
WBC: 6.8 10*3/uL (ref 4.0–10.5)
nRBC: 0 % (ref 0.0–0.2)

## 2022-05-30 LAB — BASIC METABOLIC PANEL
Anion gap: 10 (ref 5–15)
BUN: 24 mg/dL — ABNORMAL HIGH (ref 8–23)
CO2: 27 mmol/L (ref 22–32)
Calcium: 9.3 mg/dL (ref 8.9–10.3)
Chloride: 104 mmol/L (ref 98–111)
Creatinine, Ser: 0.7 mg/dL (ref 0.44–1.00)
GFR, Estimated: >60 mL/min (ref >60)
Glucose, Bld: 92 mg/dL (ref 70–99)
Potassium: 4.2 mmol/L (ref 3.5–5.1)
Sodium: 141 mmol/L (ref 135–145)

## 2022-05-30 LAB — SURGICAL PCR SCREEN
MRSA, PCR: NEGATIVE
Staphylococcus aureus: NEGATIVE

## 2022-05-30 LAB — GLUCOSE, CAPILLARY: Glucose-Capillary: 83 mg/dL (ref 70–99)

## 2022-05-31 ENCOUNTER — Telehealth: Payer: Self-pay | Admitting: Internal Medicine

## 2022-05-31 ENCOUNTER — Encounter: Payer: Self-pay | Admitting: Internal Medicine

## 2022-05-31 ENCOUNTER — Other Ambulatory Visit: Payer: Self-pay | Admitting: Internal Medicine

## 2022-05-31 MED ORDER — AMLODIPINE BESYLATE 2.5 MG PO TABS: 2.5000 mg | ORAL_TABLET | Freq: Every day | ORAL | 0 refills | Status: DC

## 2022-05-31 NOTE — Telephone Encounter (Signed)
I called and spoke with the patient and she stated she had no high BP symptoms at all, but she has been in a lot of pain with her knee, in looking at the note from preop the BP was elevated. I informed the patient to take her BP all next week and to send it in through [place] for the provider to review and she agreed,  If she needs anything else please let me know.  Aurelia Gras,cma

## 2022-05-31 NOTE — Telephone Encounter (Signed)
Pt called in staying that she went for her Pre-Op yesterday for her surgery on 4/15, however, per pt her  bp was 175/95 and today was 162/87, and she was wondering If she needs bp meds before her surgery?

## 2022-05-31 NOTE — Progress Notes (Signed)
Anesthesia Chart Review   Case: [id number] Date/Time: 06/10/22 1500   Procedure: Left knee medial unicompartmental arthroplasty (Left: Knee)   Anesthesia type: Choice   Pre-op diagnosis: medial compartment osteoarthritis left knee   Location: [facility] ROOM 10 / [facility]   Surgeons: Ollen Gross, MD       DISCUSSION:76 y.o. former smoker with h/o HTN, GERD, SLE, medial compartment left knee OA scheduled for above procedure 06/10/2022 with Dr. Ollen Gross.   Per cardiology preoperative evaluation 05/07/22, "Ms. Garris's perioperative risk of a major cardiac event is 0.9% according to the Revised Cardiac Risk Index (RCRI).  Therefore, she is at low risk for perioperative complications.   Her functional capacity is fair at 4.64 METs according to the Duke Activity Status Index (DASI). Recommendations: According to ACC/AHA guidelines, no further cardiovascular testing needed.  The patient may proceed to surgery at acceptable risk.   Antiplatelet and/or Anticoagulation Recommendations: Aspirin can be held for 7 days prior to her surgery.  Please resume Aspirin post operatively when it is felt to be safe from a bleeding standpoint. "  Anticipate pt can proceed with planned procedure barring acute status change.   VS: BP (!) 162/98   Pulse 76   Temp 36.9 C (Oral)   Resp 20   Ht 5' 7.5" (1.715 m)   Wt 92.5 kg   SpO2 99%   BMI 31.48 kg/m   PROVIDERS: Sherlene Shams, MD is PCP    LABS: Labs reviewed: Acceptable for surgery. (all labs ordered are listed, but only abnormal results are displayed)  Labs Reviewed  BASIC METABOLIC PANEL - Abnormal; Notable for the following components:      Result Value   BUN 24 (*)    All other components within normal limits  SURGICAL PCR SCREEN  CBC  GLUCOSE, CAPILLARY     IMAGES:   EKG:   CV: Echo 10/18/2021 1. Left ventricular ejection fraction, by estimation, is 60 to 65%. The  left ventricle has normal function. The left ventricle has no  regional  wall motion abnormalities. Left ventricular diastolic parameters are  consistent with Grade I diastolic  dysfunction (impaired relaxation). The average left ventricular global  longitudinal strain is -18.5 %.   2. Right ventricular systolic function is normal. The right ventricular  size is normal. Tricuspid regurgitation signal is inadequate for assessing  PA pressure.   3. Left atrial size was mildly dilated.   4. The mitral valve is normal in structure. Mild mitral valve  regurgitation. No evidence of mitral stenosis. Moderate mitral annular  calcification.   5. The aortic valve is normal in structure. Aortic valve regurgitation is  mild. Aortic valve sclerosis/calcification is present, without any  evidence of aortic stenosis.   6. The inferior vena cava is normal in size with greater than 50%  respiratory variability, suggesting right atrial pressure of 3 mmHg.  Past Medical History:  Diagnosis Date   Anemia    Arthritis    Cataracts, bilateral    Chicken pox    Diverticulitis    Elevated blood pressure reading    GERD (gastroesophageal reflux disease)    Hypertension    No meds   Lupus    SLE (systemic lupus erythematosus)    diagnosed with skin biopsy and serology   Thyroid disease     Past Surgical History:  Procedure Laterality Date   ABDOMINAL HYSTERECTOMY  1987   APPENDECTOMY  1952   BREAST CYST ASPIRATION Right 25 + yrs ago  neg   CATARACT EXTRACTION, BILATERAL Bilateral 1995, 1996   EYE SURGERY  1995   x2 1995 and 1996   KNEE ARTHROSCOPY W/ ACL RECONSTRUCTION     STRABISMUS SURGERY Bilateral 09/12/2017   Procedure: BILATERAL STRABISMUS REPAIR;  Surgeon: Verne CarrowYoung, William, MD;  [facility];  Service: Ophthalmology;  Laterality: Bilateral;   TONSILLECTOMY AND ADENOIDECTOMY      MEDICATIONS:  acetaminophen (TYLENOL) 650 MG CR tablet   ALPRAZolam (XANAX) 0.25 MG tablet   aspirin EC 81 MG tablet   atorvastatin (LIPITOR) 20 MG  tablet   B Complex-C (SUPER B COMPLEX PO)   Cholecalciferol (VITAMIN D3) 50 MCG (2000 UT) TABS   COLLAGEN PO   diclofenac Sodium (VOLTAREN ARTHRITIS PAIN) 1 % GEL   Flaxseed, Linseed, (FLAX SEED OIL PO)   [place] 300 MG CAPS   hydroxychloroquine (PLAQUENIL) 200 MG tablet   MAGNESIUM GLUCONATE PO   Multiple Vitamins-Minerals (PRESERVISION AREDS 2+MULTI VIT PO)   Omeprazole 20 MG TBEC   OVER THE COUNTER MEDICATION   Propylene Glycol, PF, (SYSTANE COMPLETE PF) 0.6 % SOLN   traMADol (ULTRAM) 50 MG tablet   TYRVAYA 0.03 MG/ACT SOLN   No current facility-administered medications for this encounter.    Jodell CiproJessica Francile Woolford Ward, PA-C [facility] [phone]

## 2022-06-06 ENCOUNTER — Encounter: Payer: Self-pay | Admitting: Internal Medicine

## 2022-06-06 NOTE — Telephone Encounter (Signed)
Printed and placed in results folder.  

## 2022-06-10 ENCOUNTER — Encounter (HOSPITAL_COMMUNITY): Payer: Self-pay | Admitting: Orthopedic Surgery

## 2022-06-10 ENCOUNTER — Encounter: Payer: Self-pay | Admitting: Internal Medicine

## 2022-06-10 ENCOUNTER — Other Ambulatory Visit: Payer: Self-pay

## 2022-06-10 ENCOUNTER — Ambulatory Visit (HOSPITAL_BASED_OUTPATIENT_CLINIC_OR_DEPARTMENT_OTHER): Payer: Medicare HMO | Admitting: Anesthesiology

## 2022-06-10 ENCOUNTER — Ambulatory Visit (HOSPITAL_COMMUNITY): Payer: Medicare HMO | Admitting: Physician Assistant

## 2022-06-10 ENCOUNTER — Inpatient Hospital Stay (HOSPITAL_COMMUNITY)
Admission: AD | Admit: 2022-06-10 | Discharge: 2022-06-12 | DRG: 470 | Disposition: A | Discharge status: Home / Self Care | Payer: Medicare HMO | Attending: Orthopedic Surgery | Admitting: Orthopedic Surgery

## 2022-06-10 ENCOUNTER — Encounter (HOSPITAL_COMMUNITY)
Admission: AD | Disposition: A | Discharge status: Home / Self Care | Payer: Self-pay | Source: Home / Self Care | Attending: Orthopedic Surgery

## 2022-06-10 DIAGNOSIS — M1712 Unilateral primary osteoarthritis, left knee: Secondary | ICD-10-CM

## 2022-06-10 DIAGNOSIS — Z833 Family history of diabetes mellitus: Secondary | ICD-10-CM

## 2022-06-10 DIAGNOSIS — Z90710 Acquired absence of both cervix and uterus: Secondary | ICD-10-CM

## 2022-06-10 DIAGNOSIS — M329 Systemic lupus erythematosus, unspecified: Secondary | ICD-10-CM | POA: Diagnosis present

## 2022-06-10 DIAGNOSIS — Z8616 Personal history of COVID-19: Secondary | ICD-10-CM

## 2022-06-10 DIAGNOSIS — G8918 Other acute postprocedural pain: Secondary | ICD-10-CM | POA: Diagnosis not present

## 2022-06-10 DIAGNOSIS — Z8249 Family history of ischemic heart disease and other diseases of the circulatory system: Secondary | ICD-10-CM

## 2022-06-10 DIAGNOSIS — Z7982 Long term (current) use of aspirin: Secondary | ICD-10-CM

## 2022-06-10 DIAGNOSIS — M179 Osteoarthritis of knee, unspecified: Secondary | ICD-10-CM | POA: Diagnosis present

## 2022-06-10 DIAGNOSIS — E785 Hyperlipidemia, unspecified: Secondary | ICD-10-CM | POA: Diagnosis present

## 2022-06-10 DIAGNOSIS — Z7901 Long term (current) use of anticoagulants: Secondary | ICD-10-CM

## 2022-06-10 DIAGNOSIS — K219 Gastro-esophageal reflux disease without esophagitis: Secondary | ICD-10-CM | POA: Diagnosis present

## 2022-06-10 DIAGNOSIS — Z87891 Personal history of nicotine dependence: Secondary | ICD-10-CM

## 2022-06-10 DIAGNOSIS — Z83438 Family history of other disorder of lipoprotein metabolism and other lipidemia: Secondary | ICD-10-CM

## 2022-06-10 DIAGNOSIS — Z79899 Other long term (current) drug therapy: Secondary | ICD-10-CM

## 2022-06-10 DIAGNOSIS — I10 Essential (primary) hypertension: Secondary | ICD-10-CM | POA: Diagnosis present

## 2022-06-10 HISTORY — PX: PARTIAL KNEE ARTHROPLASTY: SHX2174

## 2022-06-10 SURGERY — ARTHROPLASTY, KNEE, UNICOMPARTMENTAL
Anesthesia: Spinal | Site: Knee | Laterality: Left

## 2022-06-10 MED ORDER — LACTATED RINGERS IV SOLN: INTRAVENOUS | Status: DC

## 2022-06-10 MED ORDER — 0.9 % SODIUM CHLORIDE (POUR BTL) OPTIME
TOPICAL | Status: DC | PRN
  Administered 2022-06-10: 1000 mL

## 2022-06-10 MED ORDER — PROPOFOL 1000 MG/100ML IV EMUL
INTRAVENOUS | Status: AC
  Filled 2022-06-10: qty 100

## 2022-06-10 MED ORDER — SODIUM CHLORIDE (PF) 0.9 % IJ SOLN
INTRAMUSCULAR | Status: DC | PRN
  Administered 2022-06-10: 50 mL
  Administered 2022-06-10: 10 mL

## 2022-06-10 MED ORDER — DOCUSATE SODIUM 100 MG PO CAPS
100.0000 mg | ORAL_CAPSULE | Freq: Two times a day (BID) | ORAL | Status: DC
  Administered 2022-06-10 – 2022-06-11 (×3): 100 mg via ORAL
  Filled 2022-06-10 (×4): qty 1

## 2022-06-10 MED ORDER — HYDROMORPHONE HCL 1 MG/ML IJ SOLN: 0.2500 mg | INTRAMUSCULAR | Status: DC | PRN

## 2022-06-10 MED ORDER — BUPIVACAINE IN DEXTROSE 0.75-8.25 % IT SOLN
INTRATHECAL | Status: DC | PRN
  Administered 2022-06-10: 1.6 mL via INTRATHECAL

## 2022-06-10 MED ORDER — SODIUM CHLORIDE (PF) 0.9 % IJ SOLN
INTRAMUSCULAR | Status: AC
  Filled 2022-06-10: qty 10

## 2022-06-10 MED ORDER — FLEET ENEMA 7-19 GM/118ML RE ENEM: 1.0000 | ENEMA | Freq: Once | RECTAL | Status: DC | PRN

## 2022-06-10 MED ORDER — HYDROMORPHONE HCL 1 MG/ML IJ SOLN
0.5000 mg | INTRAMUSCULAR | Status: DC | PRN
  Administered 2022-06-11 (×3): 1 mg via INTRAVENOUS
  Filled 2022-06-10 (×4): qty 1

## 2022-06-10 MED ORDER — FENTANYL CITRATE (PF) 100 MCG/2ML IJ SOLN
INTRAMUSCULAR | Status: AC
  Filled 2022-06-10: qty 2

## 2022-06-10 MED ORDER — RIVAROXABAN 10 MG PO TABS
10.0000 mg | ORAL_TABLET | Freq: Every day | ORAL | Status: DC
  Administered 2022-06-11 – 2022-06-12 (×2): 10 mg via ORAL
  Filled 2022-06-10 (×2): qty 1

## 2022-06-10 MED ORDER — DEXMEDETOMIDINE HCL IN NACL 80 MCG/20ML IV SOLN
INTRAVENOUS | Status: AC
  Filled 2022-06-10: qty 20

## 2022-06-10 MED ORDER — FENTANYL CITRATE PF 50 MCG/ML IJ SOSY
50.0000 ug | PREFILLED_SYRINGE | INTRAMUSCULAR | Status: AC
  Administered 2022-06-10: 50 ug via INTRAVENOUS
  Filled 2022-06-10: qty 2

## 2022-06-10 MED ORDER — ORAL CARE MOUTH RINSE: 15.0000 mL | Freq: Once | OROMUCOSAL | Status: AC

## 2022-06-10 MED ORDER — ONDANSETRON HCL 4 MG PO TABS: 4.0000 mg | ORAL_TABLET | Freq: Four times a day (QID) | ORAL | Status: DC | PRN

## 2022-06-10 MED ORDER — TRAMADOL HCL 50 MG PO TABS
50.0000 mg | ORAL_TABLET | Freq: Four times a day (QID) | ORAL | Status: DC | PRN
  Administered 2022-06-10 – 2022-06-11 (×2): 100 mg via ORAL
  Filled 2022-06-10 (×2): qty 2

## 2022-06-10 MED ORDER — METOCLOPRAMIDE HCL 5 MG PO TABS: 5.0000 mg | ORAL_TABLET | Freq: Three times a day (TID) | ORAL | Status: DC | PRN

## 2022-06-10 MED ORDER — POLYETHYLENE GLYCOL 3350 17 G PO PACK: 17.0000 g | PACK | Freq: Every day | ORAL | Status: DC | PRN

## 2022-06-10 MED ORDER — SODIUM CHLORIDE (PF) 0.9 % IJ SOLN
INTRAMUSCULAR | Status: AC
  Filled 2022-06-10: qty 50

## 2022-06-10 MED ORDER — SODIUM CHLORIDE 0.9 % IV SOLN: INTRAVENOUS | Status: DC

## 2022-06-10 MED ORDER — EPHEDRINE 5 MG/ML INJ
INTRAVENOUS | Status: AC
  Filled 2022-06-10: qty 5

## 2022-06-10 MED ORDER — ACETAMINOPHEN 10 MG/ML IV SOLN: 1000.0000 mg | Freq: Four times a day (QID) | INTRAVENOUS | Status: DC

## 2022-06-10 MED ORDER — OXYCODONE HCL 5 MG PO TABS: 5.0000 mg | ORAL_TABLET | Freq: Once | ORAL | Status: DC | PRN

## 2022-06-10 MED ORDER — DEXAMETHASONE SODIUM PHOSPHATE 10 MG/ML IJ SOLN
10.0000 mg | Freq: Once | INTRAMUSCULAR | Status: AC
  Administered 2022-06-11: 10 mg via INTRAVENOUS
  Filled 2022-06-10: qty 1

## 2022-06-10 MED ORDER — OXYCODONE HCL 5 MG PO TABS
5.0000 mg | ORAL_TABLET | ORAL | Status: DC | PRN
  Administered 2022-06-10: 5 mg via ORAL
  Filled 2022-06-10 (×2): qty 1
  Filled 2022-06-10: qty 2

## 2022-06-10 MED ORDER — METHOCARBAMOL 500 MG IVPB - SIMPLE MED: 500.0000 mg | Freq: Four times a day (QID) | INTRAVENOUS | Status: DC | PRN

## 2022-06-10 MED ORDER — LIDOCAINE HCL (CARDIAC) PF 100 MG/5ML IV SOSY
PREFILLED_SYRINGE | INTRAVENOUS | Status: DC | PRN
  Administered 2022-06-10: 50 mg via INTRAVENOUS

## 2022-06-10 MED ORDER — SODIUM CHLORIDE 0.9 % IV SOLN
INTRAVENOUS | Status: AC | PRN
  Administered 2022-06-10: 1000 mL via INTRAMUSCULAR

## 2022-06-10 MED ORDER — ACETAMINOPHEN 500 MG PO TABS
1000.0000 mg | ORAL_TABLET | Freq: Four times a day (QID) | ORAL | Status: AC
  Administered 2022-06-11 (×3): 1000 mg via ORAL
  Filled 2022-06-10 (×4): qty 2

## 2022-06-10 MED ORDER — ROPIVACAINE HCL 7.5 MG/ML IJ SOLN
INTRAMUSCULAR | Status: DC | PRN
  Administered 2022-06-10: 20 mL via PERINEURAL

## 2022-06-10 MED ORDER — LIDOCAINE HCL (PF) 2 % IJ SOLN
INTRAMUSCULAR | Status: AC
  Filled 2022-06-10: qty 5

## 2022-06-10 MED ORDER — ALPRAZOLAM 0.25 MG PO TABS
0.2500 mg | ORAL_TABLET | Freq: Every evening | ORAL | Status: DC | PRN
  Administered 2022-06-11: 0.25 mg via ORAL
  Filled 2022-06-10: qty 1

## 2022-06-10 MED ORDER — ATORVASTATIN CALCIUM 20 MG PO TABS
20.0000 mg | ORAL_TABLET | Freq: Every day | ORAL | Status: DC
  Administered 2022-06-11: 20 mg via ORAL
  Filled 2022-06-10 (×2): qty 1

## 2022-06-10 MED ORDER — PROPOFOL 500 MG/50ML IV EMUL
INTRAVENOUS | Status: DC | PRN
  Administered 2022-06-10: 50 ug/kg/min via INTRAVENOUS

## 2022-06-10 MED ORDER — METHOCARBAMOL 500 MG PO TABS
500.0000 mg | ORAL_TABLET | Freq: Four times a day (QID) | ORAL | Status: DC | PRN
  Administered 2022-06-10 – 2022-06-12 (×3): 500 mg via ORAL
  Filled 2022-06-10 (×4): qty 1

## 2022-06-10 MED ORDER — ORAL CARE MOUTH RINSE: 15.0000 mL | OROMUCOSAL | Status: DC | PRN

## 2022-06-10 MED ORDER — DIPHENHYDRAMINE HCL 12.5 MG/5ML PO ELIX: 12.5000 mg | ORAL_SOLUTION | ORAL | Status: DC | PRN

## 2022-06-10 MED ORDER — BUPIVACAINE LIPOSOME 1.3 % IJ SUSP
INTRAMUSCULAR | Status: AC
  Filled 2022-06-10: qty 20

## 2022-06-10 MED ORDER — ACETAMINOPHEN 10 MG/ML IV SOLN
1000.0000 mg | Freq: Once | INTRAVENOUS | Status: AC
  Administered 2022-06-10: 1000 mg via INTRAVENOUS
  Filled 2022-06-10: qty 100

## 2022-06-10 MED ORDER — ONDANSETRON HCL 4 MG/2ML IJ SOLN: 4.0000 mg | Freq: Once | INTRAMUSCULAR | Status: DC | PRN

## 2022-06-10 MED ORDER — TRANEXAMIC ACID-NACL 1000-0.7 MG/100ML-% IV SOLN
1000.0000 mg | INTRAVENOUS | Status: AC
  Administered 2022-06-10: 1000 mg via INTRAVENOUS
  Filled 2022-06-10: qty 100

## 2022-06-10 MED ORDER — CHLORHEXIDINE GLUCONATE 0.12 % MT SOLN
15.0000 mL | Freq: Once | OROMUCOSAL | Status: AC
  Administered 2022-06-10: 15 mL via OROMUCOSAL

## 2022-06-10 MED ORDER — DEXAMETHASONE SODIUM PHOSPHATE 10 MG/ML IJ SOLN
8.0000 mg | Freq: Once | INTRAMUSCULAR | Status: AC
  Administered 2022-06-10: 8 mg via INTRAVENOUS

## 2022-06-10 MED ORDER — ONDANSETRON HCL 4 MG/2ML IJ SOLN
4.0000 mg | Freq: Four times a day (QID) | INTRAMUSCULAR | Status: DC | PRN
  Administered 2022-06-10 – 2022-06-12 (×6): 4 mg via INTRAVENOUS
  Filled 2022-06-10 (×6): qty 2

## 2022-06-10 MED ORDER — PANTOPRAZOLE SODIUM 40 MG PO TBEC
40.0000 mg | DELAYED_RELEASE_TABLET | Freq: Every day | ORAL | Status: DC
  Administered 2022-06-11 – 2022-06-12 (×2): 40 mg via ORAL
  Filled 2022-06-10 (×2): qty 1

## 2022-06-10 MED ORDER — CEFAZOLIN SODIUM-DEXTROSE 2-4 GM/100ML-% IV SOLN
2.0000 g | Freq: Four times a day (QID) | INTRAVENOUS | Status: AC
  Administered 2022-06-10 – 2022-06-11 (×2): 2 g via INTRAVENOUS
  Filled 2022-06-10 (×2): qty 100

## 2022-06-10 MED ORDER — AMLODIPINE BESYLATE 5 MG PO TABS
2.5000 mg | ORAL_TABLET | Freq: Every day | ORAL | Status: DC
  Administered 2022-06-11 – 2022-06-12 (×2): 2.5 mg via ORAL
  Filled 2022-06-10 (×2): qty 1

## 2022-06-10 MED ORDER — PHENYLEPHRINE HCL-NACL 20-0.9 MG/250ML-% IV SOLN
INTRAVENOUS | Status: DC | PRN
  Administered 2022-06-10: 40 ug/min via INTRAVENOUS

## 2022-06-10 MED ORDER — METOCLOPRAMIDE HCL 5 MG/ML IJ SOLN
5.0000 mg | Freq: Three times a day (TID) | INTRAMUSCULAR | Status: DC | PRN
  Administered 2022-06-11: 10 mg via INTRAVENOUS
  Administered 2022-06-11: 5 mg via INTRAVENOUS
  Filled 2022-06-10 (×3): qty 2

## 2022-06-10 MED ORDER — ONDANSETRON HCL 4 MG/2ML IJ SOLN
INTRAMUSCULAR | Status: DC | PRN
  Administered 2022-06-10: 4 mg via INTRAVENOUS

## 2022-06-10 MED ORDER — MENTHOL 3 MG MT LOZG: 1.0000 | LOZENGE | OROMUCOSAL | Status: DC | PRN

## 2022-06-10 MED ORDER — DEXMEDETOMIDINE HCL IN NACL 80 MCG/20ML IV SOLN
INTRAVENOUS | Status: DC | PRN
  Administered 2022-06-10: 8 ug via BUCCAL

## 2022-06-10 MED ORDER — STERILE WATER FOR IRRIGATION IR SOLN
Status: DC | PRN
  Administered 2022-06-10 (×2): 1000 mL

## 2022-06-10 MED ORDER — PHENOL 1.4 % MT LIQD: 1.0000 | OROMUCOSAL | Status: DC | PRN

## 2022-06-10 MED ORDER — BISACODYL 10 MG RE SUPP: 10.0000 mg | Freq: Every day | RECTAL | Status: DC | PRN

## 2022-06-10 MED ORDER — FENTANYL CITRATE (PF) 100 MCG/2ML IJ SOLN
INTRAMUSCULAR | Status: DC | PRN
  Administered 2022-06-10: 25 ug via INTRAVENOUS
  Administered 2022-06-10: 50 ug via INTRAVENOUS
  Administered 2022-06-10: 25 ug via INTRAVENOUS

## 2022-06-10 MED ORDER — CEFAZOLIN SODIUM-DEXTROSE 2-4 GM/100ML-% IV SOLN
2.0000 g | INTRAVENOUS | Status: AC
  Administered 2022-06-10: 2 g via INTRAVENOUS
  Filled 2022-06-10: qty 100

## 2022-06-10 MED ORDER — POVIDONE-IODINE 10 % EX SWAB: 2.0000 | Freq: Once | CUTANEOUS | Status: DC

## 2022-06-10 MED ORDER — EPHEDRINE SULFATE (PRESSORS) 50 MG/ML IJ SOLN
INTRAMUSCULAR | Status: DC | PRN
  Administered 2022-06-10: 5 mg via INTRAVENOUS

## 2022-06-10 MED ORDER — BUPIVACAINE LIPOSOME 1.3 % IJ SUSP
INTRAMUSCULAR | Status: DC | PRN
  Administered 2022-06-10: 20 mL

## 2022-06-10 MED ORDER — BUPIVACAINE LIPOSOME 1.3 % IJ SUSP: 20.0000 mL | Freq: Once | INTRAMUSCULAR | Status: DC

## 2022-06-10 MED ORDER — ONDANSETRON HCL 4 MG/2ML IJ SOLN
INTRAMUSCULAR | Status: AC
  Filled 2022-06-10: qty 2

## 2022-06-10 MED ORDER — OXYCODONE HCL 5 MG/5ML PO SOLN: 5.0000 mg | Freq: Once | ORAL | Status: DC | PRN

## 2022-06-10 SURGICAL SUPPLY — 66 items
BAG COUNTER SPONGE SURGICOUNT (BAG) IMPLANT
BAG DECANTER FOR FLEXI CONT (MISCELLANEOUS) IMPLANT
BAG SPEC THK2 15X12 ZIP CLS (MISCELLANEOUS)
BAG SPNG CNTER NS LX DISP (BAG)
BAG ZIPLOCK 12X15 (MISCELLANEOUS) IMPLANT
BLADE SAW RECIPROCATING 77.5 (BLADE) ×1 IMPLANT
BLADE SAW SGTL 11.0X1.19X90.0M (BLADE) ×1 IMPLANT
BNDG CMPR 5X62 HK CLSR LF (GAUZE/BANDAGES/DRESSINGS) ×1
BNDG CMPR MED 10X6 ELC LF (GAUZE/BANDAGES/DRESSINGS) ×1
BNDG ELASTIC 6INX 5YD STR LF (GAUZE/BANDAGES/DRESSINGS) ×1 IMPLANT
BNDG ELASTIC 6X10 VLCR STRL LF (GAUZE/BANDAGES/DRESSINGS) IMPLANT
BOWL SMART MIX CTS (DISPOSABLE) ×1 IMPLANT
BSPLAT TIB E STRL KN LT MED TI (Joint) ×1 IMPLANT
CEMENT HV SMART SET (Cement) ×1 IMPLANT
COVER SURGICAL LIGHT HANDLE (MISCELLANEOUS) ×1 IMPLANT
CUFF TOURN SGL QUICK 34 (TOURNIQUET CUFF) ×1
CUFF TRNQT CYL 34X4.125X (TOURNIQUET CUFF) ×1 IMPLANT
DRAPE INCISE IOBAN 66X45 STRL (DRAPES) ×3 IMPLANT
DRAPE U-SHAPE 47X51 STRL (DRAPES) ×1 IMPLANT
DRESSING AQUACEL AG SP 3.5X10 (GAUZE/BANDAGES/DRESSINGS) ×1 IMPLANT
DRSG ADAPTIC 3X8 NADH LF (GAUZE/BANDAGES/DRESSINGS) IMPLANT
DRSG AQUACEL AG ADV 3.5X10 (GAUZE/BANDAGES/DRESSINGS) IMPLANT
DRSG AQUACEL AG SP 3.5X10 (GAUZE/BANDAGES/DRESSINGS) ×1
DURAPREP 26ML APPLICATOR (WOUND CARE) ×1 IMPLANT
ELECT REM PT RETURN 15FT ADLT (MISCELLANEOUS) ×1 IMPLANT
GAUZE PAD ABD 8X10 STRL (GAUZE/BANDAGES/DRESSINGS) IMPLANT
GAUZE SPONGE 4X4 12PLY STRL (GAUZE/BANDAGES/DRESSINGS) IMPLANT
GLOVE BIO SURGEON STRL SZ 6.5 (GLOVE) ×2 IMPLANT
GLOVE BIO SURGEON STRL SZ7 (GLOVE) ×1 IMPLANT
GLOVE BIO SURGEON STRL SZ8 (GLOVE) ×2 IMPLANT
GLOVE BIOGEL PI IND STRL 7.0 (GLOVE) ×2 IMPLANT
GLOVE BIOGEL PI IND STRL 8 (GLOVE) ×2 IMPLANT
GOWN STRL REUS W/ TWL LRG LVL3 (GOWN DISPOSABLE) ×1 IMPLANT
GOWN STRL REUS W/ TWL XL LVL3 (GOWN DISPOSABLE) IMPLANT
GOWN STRL REUS W/TWL LRG LVL3 (GOWN DISPOSABLE) ×1
GOWN STRL REUS W/TWL XL LVL3 (GOWN DISPOSABLE)
HANDPIECE INTERPULSE COAX TIP (DISPOSABLE) ×1
HDLS TROCR DRIL PIN KNEE 75 (PIN) ×1
IMMOBILIZER KNEE 20 (SOFTGOODS) ×1
IMMOBILIZER KNEE 20 THIGH 36 (SOFTGOODS) ×1 IMPLANT
IMMOBILIZER KNEE 22 UNIV (SOFTGOODS) IMPLANT
INSERT TIB BEAR KNEE E 10 LT (Insert) IMPLANT
INSERTER TIP PARTIAL KNEE (MISCELLANEOUS) IMPLANT
KIT TURNOVER KIT A (KITS) IMPLANT
MANIFOLD NEPTUNE II (INSTRUMENTS) ×1 IMPLANT
NDL SAFETY ECLIP 18X1.5 (MISCELLANEOUS) IMPLANT
NS IRRIG 1000ML POUR BTL (IV SOLUTION) ×1 IMPLANT
PACK TOTAL KNEE CUSTOM (KITS) ×1 IMPLANT
PADDING CAST COTTON 6X4 STRL (CAST SUPPLIES) ×2 IMPLANT
PIN DRILL HDLS TROCAR 75 4PK (PIN) IMPLANT
PROTECTOR NERVE ULNAR (MISCELLANEOUS) ×1 IMPLANT
SCREW HEADED 33MM KNEE (MISCELLANEOUS) IMPLANT
SCREW HEADED 48MM KNEE (MISCELLANEOUS) IMPLANT
SET HNDPC FAN SPRY TIP SCT (DISPOSABLE) ×1 IMPLANT
SPIKE FLUID TRANSFER (MISCELLANEOUS) IMPLANT
STRIP CLOSURE SKIN 1/2X4 (GAUZE/BANDAGES/DRESSINGS) ×2 IMPLANT
SUT MNCRL AB 4-0 PS2 18 (SUTURE) ×1 IMPLANT
SUT STRATAFIX 0 PDS 27 VIOLET (SUTURE) ×1
SUT VIC AB 2-0 CT1 27 (SUTURE) ×2
SUT VIC AB 2-0 CT1 TAPERPNT 27 (SUTURE) ×2 IMPLANT
SUTURE STRATFX 0 PDS 27 VIOLET (SUTURE) ×1 IMPLANT
SYR 50ML LL SCALE MARK (SYRINGE) ×1 IMPLANT
SYSTEM KNEE PART FEM LM SZ 4 (Joint) IMPLANT
SYSTEM KNEE PART TIB LM SZ E (Joint) IMPLANT
WATER STERILE IRR 1000ML POUR (IV SOLUTION) ×1 IMPLANT
WRAP KNEE MAXI GEL POST OP (GAUZE/BANDAGES/DRESSINGS) IMPLANT

## 2022-06-10 NOTE — Anesthesia Procedure Notes (Signed)
Anesthesia Procedure Image    

## 2022-06-10 NOTE — Anesthesia Postprocedure Evaluation (Signed)
Anesthesia Post Note  Patient: Brenda Leon  Procedure(s) Performed: Left knee medial unicompartmental arthroplasty (Left: Knee)     Patient location during evaluation: PACU Anesthesia Type: Spinal Level of consciousness: oriented and awake and alert Pain management: pain level controlled Vital Signs Assessment: post-procedure vital signs reviewed and stable Respiratory status: spontaneous breathing, respiratory function stable and patient connected to nasal cannula oxygen Cardiovascular status: blood pressure returned to baseline and stable Postop Assessment: no headache, no backache and no apparent nausea or vomiting Anesthetic complications: no  No notable events documented.  Last Vitals:  Vitals:   06/10/22 1400 06/10/22 1415  BP: (!) 155/67 (!) 145/70  Pulse: 64 68  Resp: 12 16  Temp:    SpO2: 97% 97%    Last Pain:  Vitals:   06/10/22 1415  TempSrc:   PainSc: 0-No pain                 [name] S

## 2022-06-10 NOTE — Interval H&P Note (Signed)
History and Physical Interval Note:  06/10/2022 9:22 AM  Brenda Leon  has presented today for surgery, with the diagnosis of medial compartment osteoarthritis left knee.  The various methods of treatment have been discussed with the patient and family. After consideration of risks, benefits and other options for treatment, the patient has consented to  Procedure(s): Left knee medial unicompartmental arthroplasty (Left) as a surgical intervention.  The patient's history has been reviewed, patient examined, no change in status, stable for surgery.  I have reviewed the patient's chart and labs.  Questions were answered to the patient's satisfaction.     Homero Fellers Roderica Cathell

## 2022-06-10 NOTE — Anesthesia Procedure Notes (Signed)
Spinal  Patient location during procedure: OR Start time: 06/10/2022 11:38 AM End time: 06/10/2022 11:42 AM Reason for block: surgical anesthesia Staffing Performed: resident/CRNA  Resident/CRNA: Garth Bigness, CRNA Performed by: Garth Bigness, CRNA Authorized by: Eilene Ghazi, MD   Preanesthetic Checklist Completed: patient identified, IV checked, site marked, risks and benefits discussed, surgical consent, monitors and equipment checked, pre-op evaluation and timeout performed Spinal Block Patient position: sitting Prep: DuraPrep Patient monitoring: heart rate, cardiac monitor, continuous pulse ox and blood pressure Approach: midline Location: L4-5 Injection technique: single-shot Needle Needle type: Sprotte and Pencan  Needle gauge: 24 G Needle length: 9 cm Needle insertion depth: 5 cm Assessment Sensory level: T10 Events: CSF return

## 2022-06-10 NOTE — Evaluation (Signed)
Physical Therapy Evaluation Patient Details Name: Brenda Leon MRN: [id number] DOB: Jun 20, 1945 Today's Date: 06/10/2022  History of Present Illness  77 yo female presents to therapy s/p L unicompartmental arthoplasty on 06/10/2022 due to failure of conservative measures. Pt PMH includes but is not limited to: aortic atherosclerosis, systolic murmur, systemic lupus erythematous, HDL, anemia, thyroid dz and diverticulitis.  Clinical Impression  Brenda Leon is a 77 y.o. female POD 0 s/p L UKA. Patient reports IND with mobility at baseline. Patient is now limited by functional impairments (see PT problem list below) and requires min guard and B UE to assist with LLE to EOB with increased time for bed mobility and min A for transfers. Patient was unable to ambulate due to reports of feeling light headed and nauseated.  Patient instructed in exercise to facilitate ROM and circulation to manage edema. Pt left seated in recliner, all needs met, NT present and nurse aware of pt reports of nausea. PT arrived at 1702 and pt reported 5/10 pain and pain medication administered, PT returned 30 mins later and pain remained unchanged.  Patient will benefit from continued skilled PT interventions to address impairments and progress towards PLOF. Acute PT will follow to progress mobility and stair training in preparation for safe discharge home with family support and OPPT scheduled on 4/18.      BP semi reclined in bed 148/74 Seated EOB 140/76 Seated in recliner 146/71 Full orthostatics NT at eval due to pt reports of nausea and light headedness.    Recommendations for follow up therapy are one component of a multi-disciplinary discharge planning process, led by the attending physician.  Recommendations may be updated based on patient status, additional functional criteria and insurance authorization.  Follow Up Recommendations       Assistance Recommended at Discharge Intermittent Supervision/Assistance   Patient can return home with the following  A little help with walking and/or transfers;A little help with bathing/dressing/bathroom;Assistance with cooking/housework;Assist for transportation;Help with stairs or ramp for entrance    Equipment Recommendations    Recommendations for Other Services       Functional Status Assessment Patient has had a recent decline in their functional status and demonstrates the ability to make significant improvements in function in a reasonable and predictable amount of time.     Precautions / Restrictions Precautions Precautions: Knee;Fall Restrictions Weight Bearing Restrictions: No      Mobility  Bed Mobility Overal bed mobility: Needs Assistance Bed Mobility: Supine to Sit     Supine to sit: Min guard     General bed mobility comments: increased time, B UEs to assist LLE to EOB and HOB elevated    Transfers Overall transfer level: Needs assistance Equipment used: Rolling walker (2 wheels) Transfers: Bed to chair/wheelchair/BSC   Stand pivot transfers: Min assist         General transfer comment: min A for STS from EOB, min guard to complete pivot to recliner, pt reported dizziness and nausea seated EOB. Bp assessed    Ambulation/Gait               General Gait Details: NT  Stairs            Wheelchair Mobility    Modified Rankin (Stroke Patients Only)       Balance Overall balance assessment: Needs assistance Sitting-balance support: Feet supported (pt limiting LLE knee flexion and R LE support only) Sitting balance-Leahy Scale: Fair     Standing balance support: Bilateral upper extremity  supported, During functional activity, Reliant on assistive device for balance Standing balance-Leahy Scale: Poor                               Pertinent Vitals/Pain Pain Assessment Pain Assessment: 0-10 Pain Score: 5  Pain Location: L knee Pain Descriptors / Indicators: Aching, Discomfort,  Operative site guarding Pain Intervention(s): Monitored during session, Limited activity within patient's tolerance, Premedicated before session, Repositioned, Ice applied (pain medication administered 30 mins prior to PT eval)    Home Living Family/patient expects to be discharged to:: Private residence Living Arrangements: Spouse/significant other Available Help at Discharge: Family (husband and sister) Type of Home: House Home Access: Stairs to enter Entrance Stairs-Rails: Right Entrance Stairs-Number of Steps: 3   Home Layout: One level Home Equipment: Teacher, English as a foreign language (2 wheels);Cane - single point      Prior Function Prior Level of Function : Independent/Modified Independent             Mobility Comments: IND with all ADLs, self care tasks and IADLs, driving. pt reports trying cane but hurt her shoulder       Hand Dominance        Extremity/Trunk Assessment        Lower Extremity Assessment Lower Extremity Assessment: LLE deficits/detail LLE Deficits / Details: ankle DF/PF 5/5; SLR < 10 degree lag LLE Sensation: WNL    Cervical / Trunk Assessment Cervical / Trunk Assessment: Normal  Communication   Communication: No difficulties  Cognition Arousal/Alertness: Awake/alert Behavior During Therapy: WFL for tasks assessed/performed Overall Cognitive Status: Within Functional Limits for tasks assessed                                          General Comments      Exercises Total Joint Exercises Ankle Circles/Pumps: AROM, Both, 20 reps   Assessment/Plan    PT Assessment Patient needs continued PT services  PT Problem List         PT Treatment Interventions DME instruction;Gait training;Stair training;Functional mobility training;Therapeutic activities;Therapeutic exercise;Balance training;Neuromuscular re-education;Patient/family education;Modalities    PT Goals (Current goals can be found in the Care Plan section)  Acute  Rehab PT Goals Patient Stated Goal: to get back to exercise class PT Goal Formulation: With patient Time For Goal Achievement: 06/24/22 Potential to Achieve Goals: Good    Frequency 7X/week     Co-evaluation               AM-PAC PT "6 Clicks" Mobility  Outcome Measure Help needed turning from your back to your side while in a flat bed without using bedrails?: A Little Help needed moving from lying on your back to sitting on the side of a flat bed without using bedrails?: A Little Help needed moving to and from a bed to a chair (including a wheelchair)?: A Little Help needed standing up from a chair using your arms (e.g., wheelchair or bedside chair)?: A Little Help needed to walk in hospital room?: Total Help needed climbing 3-5 steps with a railing? : Total 6 Click Score: 14    End of Session Equipment Utilized During Treatment: Gait belt Activity Tolerance: No increased pain;Treatment limited secondary to medical complications (Comment) (reports of light headedness and nausea) Patient left: in chair;with call bell/phone within reach;with nursing/sitter in room Nurse Communication: Mobility status PT Visit Diagnosis:  Unsteadiness on feet (R26.81);Other abnormalities of gait and mobility (R26.89);Repeated falls (R29.6);Muscle weakness (generalized) (M62.81);Pain;Difficulty in walking, not elsewhere classified (R26.2) Pain - Right/Left: Left Pain - part of body: Knee    Time: 8251-8984 PT Time Calculation (min) (ACUTE ONLY): 39 min   Charges:   PT Evaluation $PT Eval Low Complexity: 1 Low PT Treatments $Therapeutic Activity: 23-37 mins        Rica Mote, PT   Jacqualyn Posey 06/10/2022, 6:17 PM

## 2022-06-10 NOTE — Progress Notes (Signed)
Orthopedic Tech Progress Note Patient Details:  Brenda Leon 09/08/1945 [id number]  CPM Left Knee CPM Left Knee: On Left Knee Flexion (Degrees): 40 Left Knee Extension (Degrees): 10  Post Interventions Patient Tolerated: Well  [name] 06/10/2022, 1:49 PM

## 2022-06-10 NOTE — Progress Notes (Signed)
Orthopedic Tech Progress Note Patient Details:  Brenda Leon [birth year] [id number]  CPM Left Knee CPM Left Knee: Off Left Knee Flexion (Degrees): 40 Left Knee Extension (Degrees): 10  Post Interventions Patient Tolerated: Well  [name] 06/10/2022, 5:39 PM

## 2022-06-10 NOTE — Op Note (Signed)
OPERATIVE REPORT-UNICOMPARTMENTAL ARTHROPLASTY  PREOPERATIVE DIAGNOSIS: Medial compartment osteoarthritis, Left knee  POSTOPERATIVE DIAGNOSIS: Medial compartment osteoarthritis, Left knee  PROCEDURE: Left knee medial unicompartmental arthroplasty. ([facility] PPK)  SURGEON: Ollen Gross, MD   ASSISTANT: Leilani Able, PA-C  ANESTHESIA:  Adductor canal block and spinal.   ESTIMATED BLOOD LOSS: Minimal.   DRAINS: Hemovac x1.   TOURNIQUET TIME:  Total Tourniquet Time Documented: Thigh (Left) - 30 minutes Total: Thigh (Left) - 30 minutes   COMPLICATIONS: None.   CONDITION: Stable to recovery.   BRIEF CLINICAL NOTE:  Brenda Leon is a 77 y.o. female , who has  significant isolated medial compartment arthritis of the Left knee. The patient has had nonoperative management including injections of cortisone and viscous supplements. Unfortunately, the pain persists.  Radiograph showed isolated medial compartment bone-on-bone arthritis  with normal-appearing patellofemoral and lateral compartments. The patient presents now for left knee unicompartmental arthroplasty.   PROCEDURE IN DETAIL: After successful administration of  Adductor canal block and spinal anesthetic, a tourniquet was placed high on theLeft thigh and the Left lower extremity prepped and draped in usual sterile fashion. Extremity was wrapped in an Esmarch, knee flexed, and tourniquet inflated to 300 mmHg.       A midline incision was made with a 10 blade through subcutaneous tissue to the extensor mechanism. A fresh blade was used to make a  medial parapatellar arthrotomy. Soft tissue on the proximal medial  tibia subperiosteally elevated to the joint line with a knife and into  the semimembranosus bursa with a Cobb elevator. The patella was  subluxed laterally, and the knee flexed 90 degrees. The ACL was intact.  The marginal osteophytes on the medial femur and tibia were removed with  a rongeur. The medial meniscus  was also removed. The extramedullary tibial cutting guide was placed referencing Proximally at the medial aspect of the tibial tubercle and distally along the 2nd metatarsal axis and tibial crest. 6 degrees of posterior slope was dialed in and the block was pinned to remove 4 mm from the medial tibial surface.The cut is made with an oscillating saw and the cut bone removed.      The 9 mm spacer was then placed with the knee in extension with a stable fit. The distal femoral cutting guide was attached to the spacer in extension and pinned to make the distal femur cut with an oscillating saw.  The trial size 4 is placed and is most appropriate. The femoral preparation is completed with the posterior and chamfer cuts and drilling of the 2 lug holes. The trial size 4 femur is placed with excellent fit. The 10 mm spacer is placed and there is excellent balance through full range of motion.  The trial and the spacer are removed and tibia addressed.      The tibial sizer is placed and size E is most appropriate. The proximal tibia is prepared with the drill holes and keel for the size E The size E implant is placed with excellent fit. The trials are removed and cut bone surfaces prepared with pulsatile lavage. The cement is mixed and once ready for implantation The size E tibia and size 4 femur are cemented into place and all extruded cement removed. The 10 mm insert is then placed into the tibial tray  and locked into position. The knee is placed through a full range of motion with excellent stability.           I then injected the extensor  mechanism, periosteum of  the femur and subcu tissues, a total of 20 mL of Exparel mixed with 30  mL of saline. Wound was copiously irrigated with saline solution, and the arthrotomy closed over a Hemovac drain with a running #0 Stratofix  suture. The subcutaneous was closed with  interrupted 2-0 Vicryl and subcuticular running 4-0 Monocryl. The drain  was hooked to  suction. Incision cleaned and dried and Steri-Strips and  a bulky sterile dressing applied. The tourniquet was released after a  total time of 30 minutes. This was done after closing the extensor  mechanism. The wound was closed and a bulky sterile dressing was  applied. The operative limb was placed into a knee immobilizer, and the patient awakened and transported to recovery room in stable condition.       Please note that a surgical assistant was a medical necessity for this  procedure in order to perform it in a safe and expeditious manner.  Assistance was necessary for retracting vital ligaments, neurovascular  structures, as well as for proper positioning of the limb to allow for  appropriate bone cuts and appropriate placement of the prosthesis.    Gus Rankin Efstathios Sawin, MD

## 2022-06-10 NOTE — Transfer of Care (Signed)
Immediate Anesthesia Transfer of Care Note  Patient: Brenda Leon  Procedure(s) Performed: Left knee medial unicompartmental arthroplasty (Left: Knee)  Patient Location: PACU  Anesthesia Type:Spinal  Level of Consciousness: awake, alert , oriented, and patient cooperative  Airway & Oxygen Therapy: Patient Spontanous Breathing and Patient connected to face mask oxygen  Post-op Assessment: Report given to RN and Post -op Vital signs reviewed and stable  Post vital signs: Reviewed and stable  Last Vitals:  Vitals Value Taken Time  BP 118/70 06/10/22 1304  Temp    Pulse 67 06/10/22 1307  Resp 17 06/10/22 1307  SpO2 99 % 06/10/22 1307  Vitals shown include unvalidated device data.  Last Pain:  Vitals:   06/10/22 1115  TempSrc:   PainSc: 0-No pain      Patients Stated Pain Goal: 4 (06/10/22 0930)  Complications: No notable events documented.

## 2022-06-10 NOTE — Anesthesia Procedure Notes (Signed)
Anesthesia Regional Block: Adductor canal block   Pre-Anesthetic Checklist: , timeout performed,  Correct Patient, Correct Site, Correct Laterality,  Correct Procedure, Correct Position, site marked,  Risks and benefits discussed,  Surgical consent,  Pre-op evaluation,  At surgeon's request and post-op pain management  Laterality: Left  Prep: chloraprep       Needles:  Injection technique: Single-shot  Needle Type: Echogenic Needle     Needle Length: 9cm      Additional Needles:   Procedures:,,,, ultrasound used (permanent image in chart),,    Narrative:  Start time: 06/10/2022 11:00 AM End time: 06/10/2022 11:07 AM Injection made incrementally with aspirations every 5 mL.  Performed by: Personally  Anesthesiologist: Eilene Ghazi, MD  Additional Notes: Patient tolerated the procedure well without complications

## 2022-06-10 NOTE — Care Plan (Signed)
Ortho Bundle Case Management Note  Patient Details  Name: Brenda Leon MRN: [id number] Date of Birth: 04-24-1945  L UKA on 06-10-22 DCP:  Home with husband DME:  RW ordered through [facility] PT:  [facility] on 06-13-22                   DME Arranged:  Dan Humphreys rolling DME Agency:  [employer]  HH Arranged:  NA HH Agency:  NA  Additional Comments: Please contact me with any questions of if this plan should need to change.  Ennis Forts, RN,CCM [facility]  [phone] 06/10/2022, 10:29 AM

## 2022-06-10 NOTE — Discharge Instructions (Addendum)
 Frank Aluisio, MD Total Joint Specialist [facility] [address] [place], [place] [address] [phone]  TOTAL KNEE REPLACEMENT POSTOPERATIVE DIRECTIONS    Knee Rehabilitation, Guidelines Following Surgery  Results after knee surgery are often greatly improved when you follow the exercise, range of motion and muscle strengthening exercises prescribed by your doctor. Safety measures are also important to protect the knee from further injury. If any of these exercises cause you to have increased pain or swelling in your knee joint, decrease the amount until you are comfortable again and slowly increase them. If you have problems or questions, call your caregiver or physical therapist for advice.   BLOOD CLOT PREVENTION Take a 10 mg Xarelto once a day for three weeks following surgery. Then take an 81 mg Aspirin once a day for three weeks. Then discontinue Aspirin. You may resume your vitamins/supplements once you have discontinued the Xarelto. Do not take any NSAIDs (Advil, Aleve, Ibuprofen, Meloxicam, etc.) until you have discontinued the Xarelto.   HOME CARE INSTRUCTIONS  Remove items at home which could result in a fall. This includes throw rugs or furniture in walking pathways.  ICE to the affected knee as much as tolerated. Icing helps control swelling. If the swelling is well controlled you will be more comfortable and rehab easier. Continue to use ice on the knee for pain and swelling from surgery. You may notice swelling that will progress down to the foot and ankle. This is normal after surgery. Elevate the leg when you are not up walking on it.    Continue to use the breathing machine which will help keep your temperature down. It is common for your temperature to cycle up and down following surgery, especially at night when you are not up moving around and exerting yourself. The breathing machine keeps your lungs expanded and your temperature  down. Do not place pillow under the operative knee, focus on keeping the knee straight while resting  DIET You may resume your previous home diet once you are discharged from the hospital.  DRESSING / WOUND CARE / SHOWERING Keep your bulky bandage on for 2 days. On the third post-operative day you may remove the Ace bandage and gauze. There is a waterproof adhesive bandage on your skin which will stay in place until your first follow-up appointment. Once you remove this you will not need to place another bandage You may begin showering 3 days following surgery, but do not submerge the incision under water.  ACTIVITY For the first 5 days, the key is rest and control of pain and swelling Do your home exercises twice a day starting on post-operative day 3. On the days you go to physical therapy, just do the home exercises once that day. You should rest, ice and elevate the leg for 50 minutes out of every hour. Get up and walk/stretch for 10 minutes per hour. After 5 days you can increase your activity slowly as tolerated. Walk with your walker as instructed. Use the walker until you are comfortable transitioning to a cane. Walk with the cane in the opposite hand of the operative leg. You may discontinue the cane once you are comfortable and walking steadily. Avoid periods of inactivity such as sitting longer than an hour when not asleep. This helps prevent blood clots.  You may discontinue the knee immobilizer once you are able to perform a straight leg raise while lying down. You may resume a sexual relationship in one month or   when given the OK by your doctor.  You may return to work once you are cleared by your doctor.  Do not drive a car for 6 weeks or until released by your surgeon.  Do not drive while taking narcotics.  TED HOSE STOCKINGS Wear the elastic stockings on both legs for three weeks following surgery during the day. You may remove them at night for sleeping.  WEIGHT  BEARING Weight bearing as tolerated with assist device (walker, cane, etc) as directed, use it as long as suggested by your surgeon or therapist, typically at least 4-6 weeks.  POSTOPERATIVE CONSTIPATION PROTOCOL Constipation - defined medically as fewer than three stools per week and severe constipation as less than one stool per week.  One of the most common issues patients have following surgery is constipation.  Even if you have a regular bowel pattern at home, your normal regimen is likely to be disrupted due to multiple reasons following surgery.  Combination of anesthesia, postoperative narcotics, change in appetite and fluid intake all can affect your bowels.  In order to avoid complications following surgery, here are some recommendations in order to help you during your recovery period.  Colace (docusate) - Pick up an over-the-counter form of Colace or another stool softener and take twice a day as long as you are requiring postoperative pain medications.  Take with a full glass of water daily.  If you experience loose stools or diarrhea, hold the colace until you stool forms back up. If your symptoms do not get better within 1 week or if they get worse, check with your doctor. Dulcolax (bisacodyl) - Pick up over-the-counter and take as directed by the product packaging as needed to assist with the movement of your bowels.  Take with a full glass of water.  Use this product as needed if not relieved by Colace only.  MiraLax (polyethylene glycol) - Pick up over-the-counter to have on hand. MiraLax is a solution that will increase the amount of water in your bowels to assist with bowel movements.  Take as directed and can mix with a glass of water, juice, soda, coffee, or tea. Take if you go more than two days without a movement. Do not use MiraLax more than once per day. Call your doctor if you are still constipated or irregular after using this medication for 7 days in a row.  If you continue  to have problems with postoperative constipation, please contact the office for further assistance and recommendations.  If you experience "the worst abdominal pain ever" or develop nausea or vomiting, please contact the office immediatly for further recommendations for treatment.  ITCHING If you experience itching with your medications, try taking only a single pain pill, or even half a pain pill at a time.  You can also use Benadryl over the counter for itching or also to help with sleep.   MEDICATIONS See your medication summary on the "After Visit Summary" that the nursing staff will review with you prior to discharge.  You may have some home medications which will be placed on hold until you complete the course of blood thinner medication.  It is important for you to complete the blood thinner medication as prescribed by your surgeon.  Continue your approved medications as instructed at time of discharge.  PRECAUTIONS If you experience chest pain or shortness of breath - call 911 immediately for transfer to the hospital emergency department.  If you develop a fever greater that 101 F, purulent   drainage from wound, increased redness or drainage from wound, foul odor from the wound/dressing, or calf pain - CONTACT YOUR SURGEON.                                                   FOLLOW-UP APPOINTMENTS Make sure you keep all of your appointments after your operation with your surgeon and caregivers. You should call the office at the above phone number and make an appointment for approximately two weeks after the date of your surgery or on the date instructed by your surgeon outlined in the "After Visit Summary".  RANGE OF MOTION AND STRENGTHENING EXERCISES  Rehabilitation of the knee is important following a knee injury or an operation. After just a few days of immobilization, the muscles of the thigh which control the knee become weakened and shrink (atrophy). Knee exercises are designed to build up  the tone and strength of the thigh muscles and to improve knee motion. Often times heat used for twenty to thirty minutes before working out will loosen up your tissues and help with improving the range of motion but do not use heat for the first two weeks following surgery. These exercises can be done on a training (exercise) mat, on the floor, on a table or on a bed. Use what ever works the best and is most comfortable for you Knee exercises include:  Leg Lifts - While your knee is still immobilized in a splint or cast, you can do straight leg raises. Lift the leg to 60 degrees, hold for 3 sec, and slowly lower the leg. Repeat 10-20 times 2-3 times daily. Perform this exercise against resistance later as your knee gets better.  Quad and Hamstring Sets - Tighten up the muscle on the front of the thigh (Quad) and hold for 5-10 sec. Repeat this 10-20 times hourly. Hamstring sets are done by pushing the foot backward against an object and holding for 5-10 sec. Repeat as with quad sets.  Leg Slides: Lying on your back, slowly slide your foot toward your buttocks, bending your knee up off the floor (only go as far as is comfortable). Then slowly slide your foot back down until your leg is flat on the floor again. Angel Wings: Lying on your back spread your legs to the side as far apart as you can without causing discomfort.  A rehabilitation program following serious knee injuries can speed recovery and prevent re-injury in the future due to weakened muscles. Contact your doctor or a physical therapist for more information on knee rehabilitation.   POST-OPERATIVE OPIOID TAPER INSTRUCTIONS: It is important to wean off of your opioid medication as soon as possible. If you do not need pain medication after your surgery it is ok to stop day one. Opioids include: Codeine, Hydrocodone(Norco, Vicodin), Oxycodone(Percocet, oxycontin) and hydromorphone amongst others.  Long term and even short term use of opiods can  cause: Increased pain response Dependence Constipation Depression Respiratory depression And more.  Withdrawal symptoms can include Flu like symptoms Nausea, vomiting And more Techniques to manage these symptoms Hydrate well Eat regular healthy meals Stay active Use relaxation techniques(deep breathing, meditating, yoga) Do Not substitute Alcohol to help with tapering If you have been on opioids for less than two weeks and do not have pain than it is ok to stop all together.  Plan to   wean off of opioids This plan should start within one week post op of your joint replacement. Maintain the same interval or time between taking each dose and first decrease the dose.  Cut the total daily intake of opioids by one tablet each day Next start to increase the time between doses. The last dose that should be eliminated is the evening dose.   IF YOU ARE TRANSFERRED TO A SKILLED REHAB FACILITY If the patient is transferred to a skilled rehab facility following release from the hospital, a list of the current medications will be sent to the facility for the patient to continue.  When discharged from the skilled rehab facility, please have the facility set up the patient's Home Health Physical Therapy prior to being released. Also, the skilled facility will be responsible for providing the patient with their medications at time of release from the facility to include their pain medication, the muscle relaxants, and their blood thinner medication. If the patient is still at the rehab facility at time of the two week follow up appointment, the skilled rehab facility will also need to assist the patient in arranging follow up appointment in our office and any transportation needs.  MAKE SURE YOU:  Understand these instructions.  Get help right away if you are not doing well or get worse.   DENTAL ANTIBIOTICS:  In most cases prophylactic antibiotics for Dental procdeures after total joint surgery are  not necessary.  Exceptions are as follows:  1. History of prior total joint infection  2. Severely immunocompromised (Organ Transplant, cancer chemotherapy, Rheumatoid biologic meds such as Humera)  3. Poorly controlled diabetes (A1C &gt; 8.0, blood glucose over 200)  If you have one of these conditions, contact your surgeon for an antibiotic prescription, prior to your dental procedure.    Pick up stool softner and laxative for home use following surgery while on pain medications. Do not submerge incision under water. Please use good hand washing techniques while changing dressing each day. May shower starting three days after surgery. Please use a clean towel to pat the incision dry following showers. Continue to use ice for pain and swelling after surgery. Do not use any lotions or creams on the incision until instructed by your surgeon.  Information on my medicine - XARELTO (Rivaroxaban)  Why was Xarelto prescribed for you? Xarelto was prescribed for you to reduce the risk of blood clots forming after orthopedic surgery. The medical term for these abnormal blood clots is venous thromboembolism (VTE).  What do you need to know about xarelto ? Take your Xarelto ONCE DAILY at the same time every day. You may take it either with or without food.  If you have difficulty swallowing the tablet whole, you may crush it and mix in applesauce just prior to taking your dose.  Take Xarelto exactly as prescribed by your doctor and DO NOT stop taking Xarelto without talking to the doctor who prescribed the medication.  Stopping without other VTE prevention medication to take the place of Xarelto may increase your risk of developing a clot.  After discharge, you should have regular check-up appointments with your healthcare provider that is prescribing your Xarelto.    What do you do if you miss a dose? If you miss a dose, take it as soon as you remember on the same day then  continue your regularly scheduled once daily regimen the next day. Do not take two doses of Xarelto on the same day.   Important   Safety Information A possible side effect of Xarelto is bleeding. You should call your healthcare provider right away if you experience any of the following: Bleeding from an injury or your nose that does not stop. Unusual colored urine (red or dark brown) or unusual colored stools (red or black). Unusual bruising for unknown reasons. A serious fall or if you hit your head (even if there is no bleeding).  Some medicines may interact with Xarelto and might increase your risk of bleeding while on Xarelto. To help avoid this, consult your healthcare provider or pharmacist prior to using any new prescription or non-prescription medications, including herbals, vitamins, non-steroidal anti-inflammatory drugs (NSAIDs) and supplements.  This website has more information on Xarelto: www.xarelto.com.    

## 2022-06-10 NOTE — Anesthesia Preprocedure Evaluation (Signed)
Anesthesia Evaluation  Patient identified by MRN, date of birth, ID band Patient awake    Reviewed: Allergy & Precautions, H&P , NPO status , Patient's Chart, lab work & pertinent test results  Airway Mallampati: II  TM Distance: >3 FB Neck ROM: Full    Dental no notable dental hx.    Pulmonary neg pulmonary ROS, former smoker   Pulmonary exam normal breath sounds clear to auscultation       Cardiovascular hypertension, Normal cardiovascular exam Rhythm:Regular Rate:Normal     Neuro/Psych negative neurological ROS  negative psych ROS   GI/Hepatic negative GI ROS, Neg liver ROS,,,  Endo/Other  lupus  Renal/GU negative Renal ROS  negative genitourinary   Musculoskeletal  (+) Arthritis , Osteoarthritis,    Abdominal   Peds negative pediatric ROS (+)  Hematology negative hematology ROS (+)   Anesthesia Other Findings   Reproductive/Obstetrics negative OB ROS                             Anesthesia Physical Anesthesia Plan  ASA: 2  Anesthesia Plan: Spinal   Post-op Pain Management: Regional block*   Induction: Intravenous  PONV Risk Score and Plan: 2 and Propofol infusion and Treatment may vary due to age or medical condition  Airway Management Planned: Simple Face Mask  Additional Equipment:   Intra-op Plan:   Post-operative Plan:   Informed Consent: I have reviewed the patients History and Physical, chart, labs and discussed the procedure including the risks, benefits and alternatives for the proposed anesthesia with the patient or authorized representative who has indicated his/her understanding and acceptance.     Dental advisory given  Plan Discussed with: CRNA and Surgeon  Anesthesia Plan Comments:        Anesthesia Quick Evaluation

## 2022-06-11 LAB — CBC
HCT: 36.4 % (ref 36.0–46.0)
Hemoglobin: 11.9 g/dL — ABNORMAL LOW (ref 12.0–15.0)
MCH: 30.6 pg (ref 26.0–34.0)
MCHC: 32.7 g/dL (ref 30.0–36.0)
MCV: 93.6 fL (ref 80.0–100.0)
Platelets: 210 10*3/uL (ref 150–400)
RBC: 3.89 MIL/uL (ref 3.87–5.11)
RDW: 12.7 % (ref 11.5–15.5)
WBC: 11.8 10*3/uL — ABNORMAL HIGH (ref 4.0–10.5)
nRBC: 0 % (ref 0.0–0.2)

## 2022-06-11 LAB — BASIC METABOLIC PANEL
Anion gap: 9 (ref 5–15)
BUN: 13 mg/dL (ref 8–23)
CO2: 24 mmol/L (ref 22–32)
Calcium: 8.5 mg/dL — ABNORMAL LOW (ref 8.9–10.3)
Chloride: 104 mmol/L (ref 98–111)
Creatinine, Ser: 0.53 mg/dL (ref 0.44–1.00)
GFR, Estimated: >60 mL/min (ref >60)
Glucose, Bld: 173 mg/dL — ABNORMAL HIGH (ref 70–99)
Potassium: 3.3 mmol/L — ABNORMAL LOW (ref 3.5–5.1)
Sodium: 137 mmol/L (ref 135–145)

## 2022-06-11 MED ORDER — HYDROMORPHONE HCL 2 MG PO TABS: 1.0000 mg | ORAL_TABLET | Freq: Four times a day (QID) | ORAL | 0 refills | Status: DC | PRN

## 2022-06-11 MED ORDER — TRAMADOL HCL 50 MG PO TABS: 50.0000 mg | ORAL_TABLET | Freq: Four times a day (QID) | ORAL | 0 refills | Status: DC | PRN

## 2022-06-11 MED ORDER — HYDROMORPHONE HCL 2 MG PO TABS
1.0000 mg | ORAL_TABLET | ORAL | Status: DC | PRN
  Administered 2022-06-12 (×2): 2 mg via ORAL
  Filled 2022-06-11 (×2): qty 1

## 2022-06-11 MED ORDER — RIVAROXABAN 10 MG PO TABS: 10.0000 mg | ORAL_TABLET | Freq: Every day | ORAL | 0 refills | Status: AC

## 2022-06-11 MED ORDER — ONDANSETRON HCL 4 MG PO TABS: 4.0000 mg | ORAL_TABLET | Freq: Four times a day (QID) | ORAL | 0 refills | Status: DC | PRN

## 2022-06-11 MED ORDER — METHOCARBAMOL 500 MG PO TABS: 500.0000 mg | ORAL_TABLET | Freq: Four times a day (QID) | ORAL | 0 refills | Status: DC | PRN

## 2022-06-11 NOTE — TOC Transition Note (Signed)
Transition of Care [facility]) - CM/SW Discharge Note  Patient Details  Name: Brenda Leon MRN: [id number] Date of Birth: 02/06/1946  Transition of [facility]) CM/SW Contact:  Ewing Schlein, LCSW Phone Number: 06/11/2022, 11:31 AM  Clinical Narrative: Patient is expected to discharge home after working with PT. CSW met with patient to confirm discharge plan and needs. Patient will go home with OPPT, which patient reported was switched from [facility] to [facility]. Patient will need a rolling walker, which was delivered to patient's room by [employer]. TOC signing off.  Final next level of care: OP Rehab Barriers to Discharge: No Barriers Identified  Patient Goals and CMS Choice CMS Medicare.gov Compare Post Acute Care list provided to:: Patient Choice offered to / list presented to : Patient  Discharge Plan and Services Additional resources added to the After Visit Summary for         DME Arranged: Walker rolling DME Agency: [employer] Representative spoke with at DME Agency: Prearranged in orthopedist's office HH Arranged: NA HH Agency: NA  Social Determinants of Health (SDOH) Interventions SDOH Screenings   Food Insecurity: No Food Insecurity (06/10/2022)  Housing: Low Risk  (06/10/2022)  Transportation Needs: No Transportation Needs (06/10/2022)  Utilities: Not At Risk (06/10/2022)  Alcohol Screen: Low Risk  (03/22/2021)  Depression (PHQ2-9): Low Risk  (05/10/2022)  Financial Resource Strain: Low Risk  (03/22/2022)  Physical Activity: Inactive (03/22/2021)  Social Connections: Moderately Isolated (03/22/2022)  Stress: Stress Concern Present (03/22/2022)  Tobacco Use: Medium Risk (06/10/2022)   Readmission Risk Interventions     No data to display

## 2022-06-11 NOTE — Progress Notes (Signed)
Physical Therapy Treatment Patient Details Name: Brenda Leon MRN: [id number] DOB: 31-Oct-1945 Today's Date: 06/11/2022   History of Present Illness 77 yo female presents to therapy s/p L unicompartmental arthoplasty on 06/10/2022 due to failure of conservative measures. Pt PMH includes but is not limited to: aortic atherosclerosis, systolic murmur, systemic lupus erythematous, HDL, anemia, thyroid dz and diverticulitis.    PT Comments    Pt stood at edge of bed and reported onset of nausea, so was assisted back to bed. Pt performed exercises with supervision.    Recommendations for follow up therapy are one component of a multi-disciplinary discharge planning process, led by the attending physician.  Recommendations may be updated based on patient status, additional functional criteria and insurance authorization.  Follow Up Recommendations       Assistance Recommended at Discharge Intermittent Supervision/Assistance  Patient can return home with the following A little help with walking and/or transfers;A little help with bathing/dressing/bathroom;Assistance with cooking/housework;Assist for transportation;Help with stairs or ramp for entrance   Equipment Recommendations  Rolling walker (2 wheels)    Recommendations for Other Services       Precautions / Restrictions Precautions Precautions: Knee;Fall Restrictions Weight Bearing Restrictions: No     Mobility  Bed Mobility Overal bed mobility: Needs Assistance Bed Mobility: Supine to Sit     Supine to sit: Supervision, HOB elevated     General bed mobility comments: HOB up, used rail, gait belt used as RLE lifter    Transfers Overall transfer level: Needs assistance Equipment used: Rolling walker (2 wheels)     Stand pivot transfers: Min guard         General transfer comment: VCs hand placement, Pt stood briefly but reported onset of nausea so returned to bed    Ambulation/Gait               General  Gait Details: NT 2* nausea   Stairs             Wheelchair Mobility    Modified Rankin (Stroke Patients Only)       Balance     Sitting balance-Leahy Scale: Fair     Standing balance support: Bilateral upper extremity supported, During functional activity, Reliant on assistive device for balance Standing balance-Leahy Scale: Poor                              Cognition Arousal/Alertness: Awake/alert Behavior During Therapy: WFL for tasks assessed/performed Overall Cognitive Status: Within Functional Limits for tasks assessed                                          Exercises Total Joint Exercises Ankle Circles/Pumps: AROM, Both, 20 reps Quad Sets: AROM, Both, 5 reps, Supine Heel Slides: AAROM, Left, 5 reps, Supine    General Comments        Pertinent Vitals/Pain Pain Assessment Pain Score: 7  Pain Location: L knee Pain Descriptors / Indicators: Aching, Discomfort, Operative site guarding Pain Intervention(s): Limited activity within patient's tolerance, Monitored during session, Premedicated before session, Ice applied    Home Living                          Prior Function            PT Goals (current goals can  now be found in the care plan section) Acute Rehab PT Goals Patient Stated Goal: to get back to exercise class PT Goal Formulation: With patient Time For Goal Achievement: 06/24/22 Potential to Achieve Goals: Good Progress towards PT goals: Progressing toward goals    Frequency    7X/week      PT Plan Current plan remains appropriate    Co-evaluation              AM-PAC PT "6 Clicks" Mobility   Outcome Measure  Help needed turning from your back to your side while in a flat bed without using bedrails?: A Little Help needed moving from lying on your back to sitting on the side of a flat bed without using bedrails?: A Little Help needed moving to and from a bed to a chair (including a  wheelchair)?: A Little Help needed standing up from a chair using your arms (e.g., wheelchair or bedside chair)?: A Little Help needed to walk in hospital room?: Total Help needed climbing 3-5 steps with a railing? : Total 6 Click Score: 14    End of Session Equipment Utilized During Treatment: Gait belt Activity Tolerance: No increased pain;Treatment limited secondary to medical complications (Comment) (nausea) Patient left: in bed;with bed alarm set;with call bell/phone within reach Nurse Communication: Mobility status PT Visit Diagnosis: Unsteadiness on feet (R26.81);Other abnormalities of gait and mobility (R26.89);Repeated falls (R29.6);Muscle weakness (generalized) (M62.81);Pain;Difficulty in walking, not elsewhere classified (R26.2) Pain - Right/Left: Left Pain - part of body: Knee     Time: 7829-5621 PT Time Calculation (min) (ACUTE ONLY): 19 min  Charges:  $Therapeutic Activity: 8-22 mins                     Ralene Bathe Kistler PT 06/11/2022  Acute Rehabilitation Services  Office [phone]

## 2022-06-11 NOTE — Progress Notes (Signed)
Physical Therapy Treatment Patient Details Name: Brenda Leon MRN: [id number] DOB: 06-11-1945 Today's Date: 06/11/2022   History of Present Illness 77 yo female presents to therapy s/p L unicompartmental arthoplasty on 06/10/2022 due to failure of conservative measures. Pt PMH includes but is not limited to: aortic atherosclerosis, systolic murmur, systemic lupus erythematous, HDL, anemia, thyroid dz and diverticulitis.    PT Comments    Pt transferred from bed to bedside commode, then to recliner with RW. She did not feel she could tolerate ambulation this session 2* pain and fatigue. Improved activity tolerance this session.    Recommendations for follow up therapy are one component of a multi-disciplinary discharge planning process, led by the attending physician.  Recommendations may be updated based on patient status, additional functional criteria and insurance authorization.  Follow Up Recommendations       Assistance Recommended at Discharge Intermittent Supervision/Assistance  Patient can return home with the following A little help with walking and/or transfers;A little help with bathing/dressing/bathroom;Assistance with cooking/housework;Assist for transportation;Help with stairs or ramp for entrance   Equipment Recommendations  Rolling walker (2 wheels)    Recommendations for Other Services       Precautions / Restrictions Precautions Precautions: Knee;Fall Restrictions Weight Bearing Restrictions: No     Mobility  Bed Mobility Overal bed mobility: Needs Assistance Bed Mobility: Supine to Sit     Supine to sit: Supervision, HOB elevated     General bed mobility comments: HOB up, used rail, gait belt used as RLE lifter    Transfers Overall transfer level: Needs assistance Equipment used: Rolling walker (2 wheels) Transfers: Sit to/from Stand Sit to Stand: Min guard Stand pivot transfers: Min guard         General transfer comment: VCs hand  placement, SPT to bedside commode, then SPT to recliner.    Ambulation/Gait               General Gait Details: deferred   Stairs             Wheelchair Mobility    Modified Rankin (Stroke Patients Only)       Balance     Sitting balance-Leahy Scale: Fair     Standing balance support: Bilateral upper extremity supported, During functional activity, Reliant on assistive device for balance Standing balance-Leahy Scale: Poor                              Cognition Arousal/Alertness: Awake/alert Behavior During Therapy: WFL for tasks assessed/performed Overall Cognitive Status: Within Functional Limits for tasks assessed                                          Exercises Total Joint Exercises Ankle Circles/Pumps: AROM, Both, 20 reps Quad Sets: AROM, Both, 5 reps, Supine Heel Slides: AAROM, Left, 5 reps, Supine Knee Flexion: AAROM, Left, 5 reps, Seated    General Comments        Pertinent Vitals/Pain Pain Assessment Pain Score: 7  Pain Location: L knee Pain Descriptors / Indicators: Aching, Discomfort, Operative site guarding Pain Intervention(s): Limited activity within patient's tolerance, Monitored during session, Premedicated before session    Home Living                          Prior Function  PT Goals (current goals can now be found in the care plan section) Acute Rehab PT Goals Patient Stated Goal: to get back to exercise class PT Goal Formulation: With patient Time For Goal Achievement: 06/24/22 Potential to Achieve Goals: Good Progress towards PT goals: Progressing toward goals    Frequency    7X/week      PT Plan Current plan remains appropriate    Co-evaluation              AM-PAC PT "6 Clicks" Mobility   Outcome Measure  Help needed turning from your back to your side while in a flat bed without using bedrails?: A Little Help needed moving from lying on your  back to sitting on the side of a flat bed without using bedrails?: A Little Help needed moving to and from a bed to a chair (including a wheelchair)?: A Little Help needed standing up from a chair using your arms (e.g., wheelchair or bedside chair)?: A Little Help needed to walk in hospital room?: A Lot Help needed climbing 3-5 steps with a railing? : Total 6 Click Score: 15    End of Session Equipment Utilized During Treatment: Gait belt Activity Tolerance: No increased pain (nausea) Patient left: with call bell/phone within reach;in chair;with chair alarm set Nurse Communication: Mobility status PT Visit Diagnosis: Unsteadiness on feet (R26.81);Other abnormalities of gait and mobility (R26.89);Repeated falls (R29.6);Muscle weakness (generalized) (M62.81);Pain;Difficulty in walking, not elsewhere classified (R26.2) Pain - Right/Left: Left Pain - part of body: Knee     Time: 1610-9604 PT Time Calculation (min) (ACUTE ONLY): 20 min  Charges:  $Therapeutic Activity: 8-22 mins                     Ralene Bathe Kistler PT 06/11/2022  Acute Rehabilitation Services  Office [phone]

## 2022-06-11 NOTE — Progress Notes (Signed)
   Subjective: 1 Day Post-Op Procedure(s) (LRB): Left knee medial unicompartmental arthroplasty (Left) Patient reports pain as mild.   Patient seen in rounds by Dr. Aluisio. Patient is doing fair. [name] with nausea yesterday related to the oxycodone. Has tolerated IV dilaudid without issue. Denies chest pain or SOB.  We will begin mobilizing with therapy today.  Objective: Vital signs in last 24 hours: Temp:  [97.7 F (36.5 C)-98.8 F (37.1 C)] 98.8 F (37.1 C) (04/16 0603) Pulse Rate:  [61-93] 93 (04/16 0603) Resp:  [12-23] 17 (04/16 0603) BP: (111-201)/(60-95) 116/60 (04/16 0603) SpO2:  [92 %-100 %] 96 % (04/16 0603) Weight:  [92.5 kg] 92.5 kg (04/15 0930)  Intake/Output from previous day:  Intake/Output Summary (Last 24 hours) at 06/11/2022 0742 Last data filed at 06/11/2022 1610 Gross per 24 hour  Intake 3072.85 ml  Output 3395 ml  Net -322.15 ml     Intake/Output this shift: No intake/output data recorded.  Labs: Recent Labs    06/11/22 0342  HGB 11.9*   Recent Labs    06/11/22 0342  WBC 11.8*  RBC 3.89  HCT 36.4  PLT 210   Recent Labs    06/11/22 0342  NA 137  K 3.3*  CL 104  CO2 24  BUN 13  CREATININE 0.53  GLUCOSE 173*  CALCIUM 8.5*   No results for input(s): "LABPT", "INR" in the last 72 hours.  Exam: General - Patient is Alert and Oriented Extremity - Neurologically intact Neurovascular intact Sensation intact distally Dorsiflexion/Plantar flexion intact Dressing - dressing C/D/I Motor Function - intact, moving foot and toes well on exam.   Past Medical History:  Diagnosis Date   Anemia    Arthritis    Cataracts, bilateral    Chicken pox    Diverticulitis    Elevated blood pressure reading    GERD (gastroesophageal reflux disease)    Hypertension    No meds   Lupus    SLE (systemic lupus erythematosus)    diagnosed with skin biopsy and serology   Thyroid disease     Assessment/Plan: 1 Day Post-Op Procedure(s)  (LRB): Left knee medial unicompartmental arthroplasty (Left) Principal Problem:   OA (osteoarthritis) of knee Active Problems:   Primary osteoarthritis of left knee  Estimated body mass index is 31.48 kg/m as calculated from the following:   Height as of this encounter: 5' 7.5" (1.715 m).   Weight as of this encounter: 92.5 kg. Advance diet Up with therapy D/C IV fluids   DVT Prophylaxis - Xarelto Weight bearing as tolerated. Continue therapy.  Will switch PO pain medication to dilaudid and send home with zofran.   Plan is to go Home after hospital stay. Plan for discharge later today if progresses with therapy and meeting goals. Scheduled for OPPT at [facility]. Follow-up in the office in 2 weeks.  The PDMP database was reviewed today prior to any opioid medications being prescribed to this patient.  Arther Abbott, PA-C Orthopedic Surgery [phone] 06/11/2022, 7:42 AM

## 2022-06-12 ENCOUNTER — Encounter (HOSPITAL_COMMUNITY): Payer: Self-pay | Admitting: Orthopedic Surgery

## 2022-06-12 DIAGNOSIS — M1712 Unilateral primary osteoarthritis, left knee: Secondary | ICD-10-CM | POA: Diagnosis not present

## 2022-06-12 DIAGNOSIS — Z8616 Personal history of COVID-19: Secondary | ICD-10-CM | POA: Diagnosis not present

## 2022-06-12 DIAGNOSIS — Z79899 Other long term (current) drug therapy: Secondary | ICD-10-CM | POA: Diagnosis not present

## 2022-06-12 DIAGNOSIS — M329 Systemic lupus erythematosus, unspecified: Secondary | ICD-10-CM | POA: Diagnosis not present

## 2022-06-12 DIAGNOSIS — K219 Gastro-esophageal reflux disease without esophagitis: Secondary | ICD-10-CM | POA: Diagnosis not present

## 2022-06-12 DIAGNOSIS — Z96652 Presence of left artificial knee joint: Secondary | ICD-10-CM | POA: Diagnosis not present

## 2022-06-12 DIAGNOSIS — Z833 Family history of diabetes mellitus: Secondary | ICD-10-CM | POA: Diagnosis not present

## 2022-06-12 DIAGNOSIS — Z90710 Acquired absence of both cervix and uterus: Secondary | ICD-10-CM | POA: Diagnosis not present

## 2022-06-12 DIAGNOSIS — E785 Hyperlipidemia, unspecified: Secondary | ICD-10-CM | POA: Diagnosis not present

## 2022-06-12 DIAGNOSIS — Z7982 Long term (current) use of aspirin: Secondary | ICD-10-CM | POA: Diagnosis not present

## 2022-06-12 DIAGNOSIS — Z83438 Family history of other disorder of lipoprotein metabolism and other lipidemia: Secondary | ICD-10-CM | POA: Diagnosis not present

## 2022-06-12 DIAGNOSIS — I10 Essential (primary) hypertension: Secondary | ICD-10-CM | POA: Diagnosis not present

## 2022-06-12 DIAGNOSIS — Z7901 Long term (current) use of anticoagulants: Secondary | ICD-10-CM | POA: Diagnosis not present

## 2022-06-12 DIAGNOSIS — Z87891 Personal history of nicotine dependence: Secondary | ICD-10-CM | POA: Diagnosis not present

## 2022-06-12 DIAGNOSIS — Z8249 Family history of ischemic heart disease and other diseases of the circulatory system: Secondary | ICD-10-CM | POA: Diagnosis not present

## 2022-06-12 LAB — CBC
HCT: 37.6 % (ref 36.0–46.0)
Hemoglobin: 12.1 g/dL (ref 12.0–15.0)
MCH: 30.6 pg (ref 26.0–34.0)
MCHC: 32.2 g/dL (ref 30.0–36.0)
MCV: 94.9 fL (ref 80.0–100.0)
Platelets: 248 10*3/uL (ref 150–400)
RBC: 3.96 MIL/uL (ref 3.87–5.11)
RDW: 13 % (ref 11.5–15.5)
WBC: 13.9 10*3/uL — ABNORMAL HIGH (ref 4.0–10.5)
nRBC: 0 % (ref 0.0–0.2)

## 2022-06-12 NOTE — Progress Notes (Signed)
   Subjective: 2 Days Post-Op Procedure(s) (LRB): Left knee medial unicompartmental arthroplasty (Left) Patient reports pain as mild.   Patient seen in rounds for Dr. Lequita Halt. Patient is feeling better this morning, still slightly nauseous but not vomiting overnight. Voiding without difficulty.   Objective: Vital signs in last 24 hours: Temp:  [98 F (36.7 C)-99.5 F (37.5 C)] 99.5 F (37.5 C) (04/17 0451) Pulse Rate:  [83-93] 93 (04/17 0451) Resp:  [15-20] 16 (04/17 0451) BP: (168-178)/(74-83) 178/83 (04/17 0451) SpO2:  [93 %-98 %] 95 % (04/17 0451)  Intake/Output from previous day:  Intake/Output Summary (Last 24 hours) at 06/12/2022 1123 Last data filed at 06/12/2022 0726 Gross per 24 hour  Intake 1273.59 ml  Output 2800 ml  Net -1526.41 ml    Intake/Output this shift: Total I/O In: -  Out: 400 [Urine:400]  Labs: Recent Labs    06/11/22 0342 06/12/22 0336  HGB 11.9* 12.1   Recent Labs    06/11/22 0342 06/12/22 0336  WBC 11.8* 13.9*  RBC 3.89 3.96  HCT 36.4 37.6  PLT 210 248   Recent Labs    06/11/22 0342  NA 137  K 3.3*  CL 104  CO2 24  BUN 13  CREATININE 0.53  GLUCOSE 173*  CALCIUM 8.5*   No results for input(s): "LABPT", "INR" in the last 72 hours.  Exam: General - Patient is Alert and Oriented Extremity - Neurologically intact Neurovascular intact Sensation intact distally Dorsiflexion/Plantar flexion intact Dressing/Incision - clean, dry, no drainage Motor Function - intact, moving foot and toes well on exam.   Past Medical History:  Diagnosis Date   Anemia    Arthritis    Cataracts, bilateral    Chicken pox    Diverticulitis    Elevated blood pressure reading    GERD (gastroesophageal reflux disease)    Hypertension    No meds   Lupus    SLE (systemic lupus erythematosus)    diagnosed with skin biopsy and serology   Thyroid disease     Assessment/Plan: 2 Days Post-Op Procedure(s) (LRB): Left knee medial unicompartmental  arthroplasty (Left) Principal Problem:   OA (osteoarthritis) of knee Active Problems:   Primary osteoarthritis of left knee  Estimated body mass index is 31.48 kg/m as calculated from the following:   Height as of this encounter: 5' 7.5" (1.715 m).   Weight as of this encounter: 92.5 kg. Up with therapy  DVT Prophylaxis - Xarelto Weight-bearing as tolerated  Discharge once cleared with physical therapy.   Arther Abbott, PA-C Orthopedic Surgery [phone] 06/12/2022, 11:23 AM

## 2022-06-12 NOTE — Progress Notes (Signed)
Physical Therapy Treatment Patient Details Name: Brenda Leon MRN: [id number] DOB: 08-30-1945 Today's Date: 06/12/2022   History of Present Illness 77 yo female presents to therapy s/p L unicompartmental arthoplasty on 06/10/2022 due to failure of conservative measures. Pt PMH includes but is not limited to: aortic atherosclerosis, systolic murmur, systemic lupus erythematous, HDL, anemia, thyroid dz and diverticulitis.    PT Comments    Pt is mobilizing well and is ready to DC home from a PT standpoint. She ambulated 63' with RW, completed stair training, and demonstrates good understanding of HEP.    Recommendations for follow up therapy are one component of a multi-disciplinary discharge planning process, led by the attending physician.  Recommendations may be updated based on patient status, additional functional criteria and insurance authorization.  Follow Up Recommendations       Assistance Recommended at Discharge Intermittent Supervision/Assistance  Patient can return home with the following A little help with walking and/or transfers;A little help with bathing/dressing/bathroom;Assistance with cooking/housework;Assist for transportation;Help with stairs or ramp for entrance   Equipment Recommendations  Rolling walker (2 wheels)    Recommendations for Other Services       Precautions / Restrictions Precautions Precautions: Knee;Fall Precaution Booklet Issued: Yes (comment) Precaution Comments: reviewed no pillow under knee Restrictions Weight Bearing Restrictions: No     Mobility  Bed Mobility Overal bed mobility: Modified Independent Bed Mobility: Supine to Sit     Supine to sit: Supervision, HOB elevated     General bed mobility comments: HOB up, used rail, gait belt used as LLE lifter    Transfers Overall transfer level: Needs assistance Equipment used: Rolling walker (2 wheels) Transfers: Sit to/from Stand Sit to Stand: Min guard            General transfer comment: VCs hand placement    Ambulation/Gait Ambulation/Gait assistance: Supervision Gait Distance (Feet): 50 Feet Assistive device: Rolling walker (2 wheels) Gait Pattern/deviations: Step-to pattern, Decreased step length - right, Decreased step length - left Gait velocity: decr     General Gait Details: VCs sequencing, 6/10 L knee pain with walking, no loss of balance   Stairs Stairs: Yes Stairs assistance: Min guard Stair Management: One rail Right, With cane, Step to pattern, Forwards Number of Stairs: 3 General stair comments: VCs sequencing   Wheelchair Mobility    Modified Rankin (Stroke Patients Only)       Balance Overall balance assessment: Needs assistance   Sitting balance-Leahy Scale: Good     Standing balance support: Bilateral upper extremity supported, During functional activity, Reliant on assistive device for balance Standing balance-Leahy Scale: Poor                              Cognition Arousal/Alertness: Awake/alert Behavior During Therapy: WFL for tasks assessed/performed Overall Cognitive Status: Within Functional Limits for tasks assessed                                          Exercises Total Joint Exercises Ankle Circles/Pumps: AROM, Both, 20 reps Quad Sets: AROM, 5 reps, Supine, Left Short Arc Quad: AROM, Left, 5 reps, Supine Heel Slides: AAROM, Left, 5 reps, Supine Hip ABduction/ADduction: AAROM, Left, 5 reps, Supine Straight Leg Raises: AAROM, Left, 5 reps, Supine Long Arc Quad: Left, AAROM, 5 reps, Seated Knee Flexion: AAROM, Left, 5 reps, Seated Goniometric  ROM: 5-45* L knee AAROM    General Comments        Pertinent Vitals/Pain Pain Assessment Pain Score: 6  Pain Location: L knee Pain Descriptors / Indicators: Aching, Discomfort, Operative site guarding Pain Intervention(s): Limited activity within patient's tolerance, Monitored during session, Premedicated before  session, Ice applied    Home Living                          Prior Function            PT Goals (current goals can now be found in the care plan section) Acute Rehab PT Goals Patient Stated Goal: to get back to exercise class PT Goal Formulation: With patient Time For Goal Achievement: 06/24/22 Potential to Achieve Goals: Good Progress towards PT goals: Progressing toward goals    Frequency    7X/week      PT Plan Current plan remains appropriate    Co-evaluation              AM-PAC PT "6 Clicks" Mobility   Outcome Measure  Help needed turning from your back to your side while in a flat bed without using bedrails?: A Little Help needed moving from lying on your back to sitting on the side of a flat bed without using bedrails?: A Little Help needed moving to and from a bed to a chair (including a wheelchair)?: None Help needed standing up from a chair using your arms (e.g., wheelchair or bedside chair)?: None Help needed to walk in hospital room?: None Help needed climbing 3-5 steps with a railing? : A Little 6 Click Score: 21    End of Session Equipment Utilized During Treatment: Gait belt Activity Tolerance: Patient tolerated treatment well Patient left: with call bell/phone within reach;in bed;with bed alarm set Nurse Communication: Mobility status PT Visit Diagnosis: Unsteadiness on feet (R26.81);Other abnormalities of gait and mobility (R26.89);Repeated falls (R29.6);Muscle weakness (generalized) (M62.81);Pain;Difficulty in walking, not elsewhere classified (R26.2) Pain - Right/Left: Left Pain - part of body: Knee     Time: 0454-0981 PT Time Calculation (min) (ACUTE ONLY): 58 min  Charges:  $Gait Training: 23-37 mins $Therapeutic Exercise: 8-22 mins $Therapeutic Activity: 8-22 mins                     Ralene Bathe Kistler PT 06/12/2022  Acute Rehabilitation Services  Office [phone]

## 2022-06-12 NOTE — Plan of Care (Signed)
  Problem: Education: Goal: Knowledge of the prescribed therapeutic regimen will improve Outcome: Adequate for Discharge   Problem: Activity: Goal: Ability to avoid complications of mobility impairment will improve Outcome: Adequate for Discharge Goal: Range of joint motion will improve Outcome: Adequate for Discharge   Problem: Clinical Measurements: Goal: Postoperative complications will be avoided or minimized Outcome: Adequate for Discharge   Problem: Pain Management: Goal: Pain level will decrease with appropriate interventions Outcome: Adequate for Discharge   Problem: Skin Integrity: Goal: Will show signs of wound healing Outcome: Adequate for Discharge   Problem: Health Behavior/Discharge Planning: Goal: Ability to manage health-related needs will improve Outcome: Adequate for Discharge   Problem: Clinical Measurements: Goal: Ability to maintain clinical measurements within normal limits will improve Outcome: Adequate for Discharge Goal: Will remain free from infection Outcome: Adequate for Discharge Goal: Diagnostic test results will improve Outcome: Adequate for Discharge Goal: Respiratory complications will improve Outcome: Adequate for Discharge Goal: Cardiovascular complication will be avoided Outcome: Adequate for Discharge   Problem: Activity: Goal: Risk for activity intolerance will decrease Outcome: Adequate for Discharge   Problem: Nutrition: Goal: Adequate nutrition will be maintained Outcome: Adequate for Discharge   Problem: Coping: Goal: Level of anxiety will decrease Outcome: Adequate for Discharge   Problem: Elimination: Goal: Will not experience complications related to bowel motility Outcome: Adequate for Discharge Goal: Will not experience complications related to urinary retention Outcome: Adequate for Discharge   Problem: Pain Managment: Goal: General experience of comfort will improve Outcome: Adequate for Discharge   Problem:  Safety: Goal: Ability to remain free from injury will improve Outcome: Adequate for Discharge   Problem: Skin Integrity: Goal: Risk for impaired skin integrity will decrease Outcome: Adequate for Discharge

## 2022-06-12 NOTE — Discharge Summary (Signed)
Patient ID: Brenda Leon MRN: [id number] DOB/AGE: 1946/01/01 77 y.o.  Admit date: 06/10/2022 Discharge date: 06/12/2022  Admission Diagnoses:  Principal Problem:   OA (osteoarthritis) of knee Active Problems:   Primary osteoarthritis of left knee   Discharge Diagnoses:  Same  Past Medical History:  Diagnosis Date   Anemia    Arthritis    Cataracts, bilateral    Chicken pox    Diverticulitis    Elevated blood pressure reading    GERD (gastroesophageal reflux disease)    Hypertension    No meds   Lupus    SLE (systemic lupus erythematosus)    diagnosed with skin biopsy and serology   Thyroid disease     Surgeries: Procedure(s): Left knee medial unicompartmental arthroplasty on 06/10/2022   Consultants:   Discharged Condition: Improved  Hospital Course: TOIYA MORRISH is an 77 y.o. female who was admitted 06/10/2022 for operative treatment ofOA (osteoarthritis) of knee. Patient has severe unremitting pain that affects sleep, daily activities, and work/hobbies. After pre-op clearance the patient was taken to the operating room on 06/10/2022 and underwent  Procedure(s): Left knee medial unicompartmental arthroplasty.    Patient was given perioperative antibiotics:  Anti-infectives (From admission, onward)    Start     Dose/Rate Route Frequency Ordered Stop   06/10/22 1800  ceFAZolin (ANCEF) IVPB 2g/100 mL premix        2 g 200 mL/hr over 30 Minutes Intravenous Every 6 hours 06/10/22 1451 06/11/22 0038   06/10/22 0915  ceFAZolin (ANCEF) IVPB 2g/100 mL premix        2 g 200 mL/hr over 30 Minutes Intravenous On call to O.R. 06/10/22 0910 06/10/22 1150        Patient was given sequential compression devices, early ambulation, and chemoprophylaxis to prevent DVT.  Patient benefited maximally from hospital stay and there were no complications.    Recent vital signs: Patient Vitals for the past 24 hrs:  BP Temp Temp src Pulse Resp SpO2  06/12/22 0451 (!) 178/83 99.5  F (37.5 C) Oral 93 16 95 %  06/11/22 2337 (!) 178/76 98.7 F (37.1 C) Oral 92 15 98 %     Recent laboratory studies:  Recent Labs    06/11/22 0342 06/12/22 0336  WBC 11.8* 13.9*  HGB 11.9* 12.1  HCT 36.4 37.6  PLT 210 248  NA 137  --   K 3.3*  --   CL 104  --   CO2 24  --   BUN 13  --   CREATININE 0.53  --   GLUCOSE 173*  --   CALCIUM 8.5*  --      Discharge Medications:   Allergies as of 06/12/2022   No Known Allergies      Medication List     STOP taking these medications    aspirin EC 81 MG tablet   COLLAGEN PO   FLAX SEED OIL PO   Horse Chestnut 300 MG Caps   MAGNESIUM GLUCONATE PO   OVER THE COUNTER MEDICATION   PRESERVISION AREDS 2+MULTI VIT PO   SUPER B COMPLEX PO   Vitamin D3 50 MCG (2000 UT) Tabs   Voltaren Arthritis Pain 1 % Gel Generic drug: diclofenac Sodium       TAKE these medications    acetaminophen 650 MG CR tablet Commonly known as: TYLENOL Take 1,300 mg by mouth in the morning and at bedtime.   ALPRAZolam 0.25 MG tablet Commonly known as: XANAX TAKE 1 TABLET BY  MOUTH AT BEDTIME AS NEEDED FOR ANXIETY What changed: See the new instructions.   amLODipine 2.5 MG tablet Commonly known as: NORVASC Take 1 tablet (2.5 mg total) by mouth daily.   atorvastatin 20 MG tablet Commonly known as: LIPITOR Take 1 tablet (20 mg total) by mouth daily.   HYDROmorphone 2 MG tablet Commonly known as: DILAUDID Take 0.5-1 tablets (1-2 mg total) by mouth every 6 (six) hours as needed for severe pain.   hydroxychloroquine 200 MG tablet Commonly known as: PLAQUENIL Take 200 mg by mouth 2 (two) times daily.   methocarbamol 500 MG tablet Commonly known as: ROBAXIN Take 1 tablet (500 mg total) by mouth every 6 (six) hours as needed for muscle spasms.   Omeprazole 20 MG Tbec Take 20 mg by mouth daily.   ondansetron 4 MG tablet Commonly known as: ZOFRAN Take 1 tablet (4 mg total) by mouth every 6 (six) hours as needed for nausea.    rivaroxaban 10 MG Tabs tablet Commonly known as: XARELTO Take 1 tablet (10 mg total) by mouth daily with breakfast for 20 days. Then resume one 81 mg aspirin once a day.   Systane Complete PF 0.6 % Soln Generic drug: Propylene Glycol (PF) Place 1 drop into both ears daily as needed (Dry eyes).   traMADol 50 MG tablet Commonly known as: ULTRAM Take 1-2 tablets (50-100 mg total) by mouth every 6 (six) hours as needed for moderate pain. What changed:  how much to take when to take this reasons to take this   Tyrvaya 0.03 MG/ACT Soln Generic drug: Varenicline Tartrate Place 1 spray into both nostrils in the morning and at bedtime.               Discharge Care Instructions  (From admission, onward)           Start     Ordered   06/11/22 0000  Weight bearing as tolerated        06/11/22 0746   06/11/22 0000  Change dressing       Comments: You may remove the bulky bandage (ACE wrap and gauze) two days after surgery. You will have an adhesive waterproof bandage underneath. Leave this in place until your first follow-up appointment.   06/11/22 0746            Diagnostic Studies: No results found.  Disposition: Discharge disposition: 01-Home or Self Care       Discharge Instructions     Call MD / Call 911   Complete by: As directed    If you experience chest pain or shortness of breath, CALL 911 and be transported to the hospital emergency room.  If you develope a fever above 101 F, pus (white drainage) or increased drainage or redness at the wound, or calf pain, call your surgeon's office.   Change dressing   Complete by: As directed    You may remove the bulky bandage (ACE wrap and gauze) two days after surgery. You will have an adhesive waterproof bandage underneath. Leave this in place until your first follow-up appointment.   Constipation Prevention   Complete by: As directed    Drink plenty of fluids.  Prune juice may be helpful.  You may use a stool  softener, such as Colace (over the counter) 100 mg twice a day.  Use MiraLax (over the counter) for constipation as needed.   Diet - low sodium heart healthy   Complete by: As directed    Do not  put a pillow under the knee. Place it under the heel.   Complete by: As directed    Driving restrictions   Complete by: As directed    No driving for two weeks   Post-operative opioid taper instructions:   Complete by: As directed    POST-OPERATIVE OPIOID TAPER INSTRUCTIONS: It is important to wean off of your opioid medication as soon as possible. If you do not need pain medication after your surgery it is ok to stop day one. Opioids include: Codeine, Hydrocodone(Norco, Vicodin), Oxycodone(Percocet, oxycontin) and hydromorphone amongst others.  Long term and even short term use of opiods can cause: Increased pain response Dependence Constipation Depression Respiratory depression And more.  Withdrawal symptoms can include Flu like symptoms Nausea, vomiting And more Techniques to manage these symptoms Hydrate well Eat regular healthy meals Stay active Use relaxation techniques(deep breathing, meditating, yoga) Do Not substitute Alcohol to help with tapering If you have been on opioids for less than two weeks and do not have pain than it is ok to stop all together.  Plan to wean off of opioids This plan should start within one week post op of your joint replacement. Maintain the same interval or time between taking each dose and first decrease the dose.  Cut the total daily intake of opioids by one tablet each day Next start to increase the time between doses. The last dose that should be eliminated is the evening dose.      TED hose   Complete by: As directed    Use stockings (TED hose) for three weeks on both leg(s).  You may remove them at night for sleeping.   Weight bearing as tolerated   Complete by: As directed         Follow-up Information     Ollen Gross, MD. Go on  06/25/2022.   Specialty: Orthopedic Surgery Why: You are scheduled for a follow up appointment on 06-25-22 at 1:45 pm. Contact information: [address] [place] [address] [phone]                  Signed: Arther Abbott 06/12/2022, 3:16 PM

## 2022-06-12 NOTE — Progress Notes (Signed)
Nausea decreased today with no episodes of emesis overnight.

## 2022-06-13 ENCOUNTER — Telehealth: Payer: Self-pay | Admitting: *Deleted

## 2022-06-13 DIAGNOSIS — M25562 Pain in left knee: Secondary | ICD-10-CM | POA: Diagnosis not present

## 2022-06-13 NOTE — Transitions of Care (Post Inpatient/ED Visit) (Signed)
   06/13/2022  Name: RAMONA RUARK MRN: [id number] DOB: 1945-12-10  Today's TOC FU Call Status: Today's TOC FU Call Status:: Unsuccessul Call (1st Attempt) Unsuccessful Call (1st Attempt) Date: 06/13/22  Attempted to reach the patient regarding the most recent Inpatient/ED visit.  Follow Up Plan: Additional outreach attempts will be made to reach the patient to complete the Transitions of Care (Post Inpatient/ED visit) call.   Gean Maidens BSN RN [facility] [phone]

## 2022-06-14 ENCOUNTER — Telehealth: Payer: Self-pay | Admitting: *Deleted

## 2022-06-14 NOTE — Transitions of Care (Post Inpatient/ED Visit) (Signed)
   06/14/2022  Name: Brenda Leon MRN: [id number] DOB: [birth year]  Today's TOC FU Call Status: Today's TOC FU Call Status:: Successful TOC FU Call Competed TOC FU Call Complete Date: 06/14/22  Transition Care Management Follow-up Telephone Call Date of Discharge: 06/13/22 Discharge Facility: [facility]) Type of Discharge: Inpatient Admission Primary Inpatient Discharge Diagnosis:: Osteoarthritis How have you been since you were released from the hospital?: Better Any questions or concerns?: No  Items Reviewed: Did you receive and understand the discharge instructions provided?: Yes Medications obtained and verified?: Yes (Medications Reviewed) Any new allergies since your discharge?: No Dietary orders reviewed?: No Do you have support at home?: Yes People in Home: spouse Name of Support/Comfort Primary Source: [facility] and Equipment/Supplies: Were Home Health Services Ordered?: No Any new equipment or medical supplies ordered?: Yes (RW) Name of Medical supply agency?: [facility] Were you able to get the equipment/medical supplies?: Yes Do you have any questions related to the use of the equipment/supplies?: No  Functional Questionnaire: Do you need assistance with bathing/showering or dressing?: Yes Do you need assistance with meal preparation?: Yes Do you need assistance with eating?: No Do you have difficulty maintaining continence: No Do you need assistance with getting out of bed/getting out of a chair/moving?: Yes Do you have difficulty managing or taking your medications?: No  Follow up appointments reviewed: PCP Follow-up appointment confirmed?: NA Specialist Hospital Follow-up appointment confirmed?: Yes Date of Specialist follow-up appointment?: 06/25/22 Follow-Up Specialty Provider:: Dr Despina Hick Do you need transportation to your follow-up appointment?: No Do you understand care options if your condition(s) worsen?: Yes-patient verbalized  understanding  SDOH Interventions Today    Flowsheet Row Most Recent Value  SDOH Interventions   Food Insecurity Interventions Intervention Not Indicated  Housing Interventions Intervention Not Indicated  Transportation Interventions Intervention Not Indicated      Interventions Today    Flowsheet Row Most Recent Value  General Interventions   General Interventions Discussed/Reviewed General Interventions Discussed, General Interventions Reviewed, Doctor Visits  Doctor Visits Discussed/Reviewed Specialist  PCP/Specialist Visits Compliance with follow-up visit  Exercise Interventions   Exercise Discussed/Reviewed Exercise Discussed  [Pt will be going to PT three times next week. Initially started this week]  Pharmacy Interventions   Pharmacy Dicussed/Reviewed Pharmacy Topics Discussed, Pharmacy Topics Reviewed        Gean Maidens BSN RN [facility] [phone]

## 2022-06-14 NOTE — Transitions of Care (Post Inpatient/ED Visit) (Signed)
   06/14/2022  Name: EVIANNA CHANDRAN MRN: [id number] DOB: 1945/04/11  Today's TOC FU Call Status: Today's TOC FU Call Status:: Unsuccessful Call (2nd Attempt) Unsuccessful Call (2nd Attempt) Date: 06/14/22  Attempted to reach the patient regarding the most recent Inpatient/ED visit.  Follow Up Plan: Additional outreach attempts will be made to reach the patient to complete the Transitions of Care (Post Inpatient/ED visit) call.   Gean Maidens BSN RN [facility] [phone]

## 2022-06-17 DIAGNOSIS — M25562 Pain in left knee: Secondary | ICD-10-CM | POA: Diagnosis not present

## 2022-06-19 DIAGNOSIS — M25562 Pain in left knee: Secondary | ICD-10-CM | POA: Diagnosis not present

## 2022-06-21 DIAGNOSIS — M25562 Pain in left knee: Secondary | ICD-10-CM | POA: Diagnosis not present

## 2022-06-22 ENCOUNTER — Other Ambulatory Visit: Payer: Self-pay | Admitting: Internal Medicine

## 2022-06-23 NOTE — Telephone Encounter (Signed)
LOV: 05/10/22  NOV: N/A

## 2022-06-28 DIAGNOSIS — M25562 Pain in left knee: Secondary | ICD-10-CM | POA: Diagnosis not present

## 2022-07-01 DIAGNOSIS — M25562 Pain in left knee: Secondary | ICD-10-CM | POA: Diagnosis not present

## 2022-07-04 DIAGNOSIS — M25562 Pain in left knee: Secondary | ICD-10-CM | POA: Diagnosis not present

## 2022-07-08 DIAGNOSIS — M25562 Pain in left knee: Secondary | ICD-10-CM | POA: Diagnosis not present

## 2022-07-11 DIAGNOSIS — M25562 Pain in left knee: Secondary | ICD-10-CM | POA: Diagnosis not present

## 2022-07-15 DIAGNOSIS — M25562 Pain in left knee: Secondary | ICD-10-CM | POA: Diagnosis not present

## 2022-07-18 DIAGNOSIS — Z5189 Encounter for other specified aftercare: Secondary | ICD-10-CM | POA: Diagnosis not present

## 2022-07-19 DIAGNOSIS — M25562 Pain in left knee: Secondary | ICD-10-CM | POA: Diagnosis not present

## 2022-07-24 DIAGNOSIS — M25562 Pain in left knee: Secondary | ICD-10-CM | POA: Diagnosis not present

## 2022-07-26 DIAGNOSIS — M25562 Pain in left knee: Secondary | ICD-10-CM | POA: Diagnosis not present

## 2022-07-29 ENCOUNTER — Encounter: Payer: Self-pay | Admitting: Internal Medicine

## 2022-07-29 DIAGNOSIS — M25562 Pain in left knee: Secondary | ICD-10-CM | POA: Diagnosis not present

## 2022-07-31 DIAGNOSIS — M25562 Pain in left knee: Secondary | ICD-10-CM | POA: Diagnosis not present

## 2022-08-05 DIAGNOSIS — M25562 Pain in left knee: Secondary | ICD-10-CM | POA: Diagnosis not present

## 2022-08-08 DIAGNOSIS — M25562 Pain in left knee: Secondary | ICD-10-CM | POA: Diagnosis not present

## 2022-08-12 DIAGNOSIS — M25562 Pain in left knee: Secondary | ICD-10-CM | POA: Diagnosis not present

## 2022-08-14 DIAGNOSIS — M25562 Pain in left knee: Secondary | ICD-10-CM | POA: Diagnosis not present

## 2022-08-19 DIAGNOSIS — M25562 Pain in left knee: Secondary | ICD-10-CM | POA: Diagnosis not present

## 2022-08-20 ENCOUNTER — Other Ambulatory Visit: Payer: Self-pay | Admitting: Internal Medicine

## 2022-08-20 NOTE — Telephone Encounter (Signed)
Refilled by a different provider.  Last OV: 05/10/2022 Next OV: not scheduled

## 2022-08-23 DIAGNOSIS — M25562 Pain in left knee: Secondary | ICD-10-CM | POA: Diagnosis not present

## 2022-08-30 ENCOUNTER — Other Ambulatory Visit: Payer: Self-pay | Admitting: Internal Medicine

## 2022-09-02 ENCOUNTER — Encounter: Payer: Self-pay | Admitting: Internal Medicine

## 2022-09-03 ENCOUNTER — Other Ambulatory Visit: Payer: Self-pay | Admitting: Internal Medicine

## 2022-09-03 MED ORDER — SERTRALINE HCL 50 MG PO TABS
50.0000 mg | ORAL_TABLET | Freq: Every day | ORAL | 1 refills | Status: DC
Start: 2022-09-03 — End: 2022-10-09

## 2022-09-27 ENCOUNTER — Encounter: Payer: Self-pay | Admitting: Internal Medicine

## 2022-10-02 ENCOUNTER — Encounter: Payer: Self-pay | Admitting: Cardiology

## 2022-10-02 DIAGNOSIS — M5416 Radiculopathy, lumbar region: Secondary | ICD-10-CM | POA: Diagnosis not present

## 2022-10-04 ENCOUNTER — Other Ambulatory Visit: Payer: Self-pay

## 2022-10-04 DIAGNOSIS — I700 Atherosclerosis of aorta: Secondary | ICD-10-CM

## 2022-10-07 ENCOUNTER — Encounter: Payer: Self-pay | Admitting: Internal Medicine

## 2022-10-09 ENCOUNTER — Other Ambulatory Visit: Payer: Self-pay | Admitting: Internal Medicine

## 2022-10-09 MED ORDER — SERTRALINE HCL 25 MG PO TABS: 25.0000 mg | ORAL_TABLET | Freq: Every day | ORAL | 1 refills | Status: DC

## 2022-10-14 ENCOUNTER — Encounter: Payer: Self-pay | Admitting: Cardiology

## 2022-10-14 DIAGNOSIS — I700 Atherosclerosis of aorta: Secondary | ICD-10-CM

## 2022-10-15 ENCOUNTER — Encounter: Payer: Self-pay | Admitting: Internal Medicine

## 2022-10-15 DIAGNOSIS — I10 Essential (primary) hypertension: Secondary | ICD-10-CM

## 2022-10-16 NOTE — Assessment & Plan Note (Signed)
Home readings per August review are < 130/80

## 2022-10-24 NOTE — Addendum Note (Signed)
Addended by: Margrett Rud on: 10/24/2022 11:09 AM   Modules accepted: Orders

## 2022-10-29 ENCOUNTER — Ambulatory Visit: Payer: Medicare HMO

## 2022-11-04 ENCOUNTER — Encounter (INDEPENDENT_AMBULATORY_CARE_PROVIDER_SITE_OTHER): Payer: Medicare HMO | Admitting: Cardiology

## 2022-11-04 DIAGNOSIS — I2510 Atherosclerotic heart disease of native coronary artery without angina pectoris: Secondary | ICD-10-CM | POA: Diagnosis not present

## 2022-11-04 NOTE — Telephone Encounter (Signed)
Please see the MyChart message reply(ies) for my assessment and plan.    This patient gave consent for this Medical Advice Message and is aware that it may result in a bill to their insurance company, as well as the possibility of receiving a bill for a co-payment or deductible. They are an established patient, but are not seeking medical advice exclusively about a problem treated during an in person or video visit in the last seven days. I did not recommend an in person or video visit within seven days of my reply.    I spent a total of 12 minutes cumulative time within 7 days through MyChart messaging.  Brian Agbor-Etang, MD   

## 2022-11-05 NOTE — Telephone Encounter (Signed)
When do you all Prior auth [employer]?

## 2022-11-07 ENCOUNTER — Encounter: Payer: Self-pay | Admitting: Internal Medicine

## 2022-11-19 ENCOUNTER — Telehealth: Payer: Self-pay | Admitting: Internal Medicine

## 2022-11-19 NOTE — Telephone Encounter (Signed)
Patient called and wanted Dr Darrick Huntsman to know she is having significant pain and hair loss. Patient wants to know if she needs to see Dr Darrick Huntsman or her Rheumatologist.

## 2022-11-19 NOTE — Telephone Encounter (Signed)
Spoke with pt and she stated that she is having significant chest pain that is radiating into her back. Pt was advised that she needs to go to the ED now. Pt said "NO, I don't believe this is my heart". I asked her why she didn't think it was her heart and she stated that she thinks this is a lupus flare because when the pain started she also started losing hair. Pt stated that she has a rheumatologist and will reach out to them to see if she can get an appt.

## 2022-11-22 ENCOUNTER — Other Ambulatory Visit: Payer: Medicare HMO

## 2022-11-24 ENCOUNTER — Other Ambulatory Visit: Payer: Self-pay | Admitting: Internal Medicine

## 2022-11-28 DIAGNOSIS — M3219 Other organ or system involvement in systemic lupus erythematosus: Secondary | ICD-10-CM | POA: Diagnosis not present

## 2022-12-02 ENCOUNTER — Encounter: Payer: Self-pay | Admitting: Cardiology

## 2022-12-05 ENCOUNTER — Ambulatory Visit: Payer: Medicare HMO | Attending: Cardiology

## 2022-12-05 DIAGNOSIS — I700 Atherosclerosis of aorta: Secondary | ICD-10-CM | POA: Diagnosis not present

## 2022-12-06 ENCOUNTER — Encounter: Payer: Self-pay | Admitting: Cardiology

## 2022-12-06 ENCOUNTER — Telehealth: Payer: Self-pay

## 2022-12-06 ENCOUNTER — Telehealth: Payer: Self-pay | Admitting: Cardiology

## 2022-12-06 MED ORDER — ISOSORBIDE MONONITRATE ER 30 MG PO TB24
15.0000 mg | ORAL_TABLET | Freq: Every day | ORAL | 0 refills | Status: DC
Start: 2022-12-06 — End: 2023-04-25

## 2022-12-06 NOTE — Telephone Encounter (Signed)
Per telephone encounter :  "Recommend starting Imdur 15 mg daily for antianginal benefit. "  I will send this medication to the patients pharmacy choice.

## 2022-12-06 NOTE — Telephone Encounter (Signed)
Called patient, she had a few questions regarding the imdur- just advised patient to monitor her vitals, and let us know of any issues/concerns. Patient verbalized understanding, thankful for call back.

## 2022-12-06 NOTE — Telephone Encounter (Signed)
Patient calling in about the medication that Dr suggested. Please advise

## 2022-12-09 ENCOUNTER — Encounter: Payer: Self-pay | Admitting: Internal Medicine

## 2022-12-20 DIAGNOSIS — Z96652 Presence of left artificial knee joint: Secondary | ICD-10-CM | POA: Diagnosis not present

## 2022-12-20 DIAGNOSIS — Z471 Aftercare following joint replacement surgery: Secondary | ICD-10-CM | POA: Diagnosis not present

## 2023-01-13 DIAGNOSIS — M7551 Bursitis of right shoulder: Secondary | ICD-10-CM | POA: Diagnosis not present

## 2023-01-13 DIAGNOSIS — M25511 Pain in right shoulder: Secondary | ICD-10-CM | POA: Diagnosis not present

## 2023-02-13 ENCOUNTER — Encounter: Payer: Self-pay | Admitting: Internal Medicine

## 2023-03-14 DIAGNOSIS — H04123 Dry eye syndrome of bilateral lacrimal glands: Secondary | ICD-10-CM | POA: Diagnosis not present

## 2023-03-14 DIAGNOSIS — H524 Presbyopia: Secondary | ICD-10-CM | POA: Diagnosis not present

## 2023-03-14 DIAGNOSIS — Z79899 Other long term (current) drug therapy: Secondary | ICD-10-CM | POA: Diagnosis not present

## 2023-03-14 DIAGNOSIS — H353131 Nonexudative age-related macular degeneration, bilateral, early dry stage: Secondary | ICD-10-CM | POA: Diagnosis not present

## 2023-03-25 ENCOUNTER — Ambulatory Visit (INDEPENDENT_AMBULATORY_CARE_PROVIDER_SITE_OTHER): Payer: PPO | Admitting: *Deleted

## 2023-03-25 VITALS — Ht 67.5 in | Wt 203.0 lb

## 2023-03-25 DIAGNOSIS — Z1231 Encounter for screening mammogram for malignant neoplasm of breast: Secondary | ICD-10-CM | POA: Diagnosis not present

## 2023-03-25 DIAGNOSIS — Z780 Asymptomatic menopausal state: Secondary | ICD-10-CM | POA: Diagnosis not present

## 2023-03-25 DIAGNOSIS — Z0000 Encounter for general adult medical examination without abnormal findings: Secondary | ICD-10-CM

## 2023-03-25 DIAGNOSIS — M8580 Other specified disorders of bone density and structure, unspecified site: Secondary | ICD-10-CM

## 2023-03-25 NOTE — Patient Instructions (Signed)
Brenda Leon , Thank you for taking time to come for your Medicare Wellness Visit. I appreciate your ongoing commitment to your health goals. Please review the following plan we discussed and let me know if I can assist you in the future.   Referrals/Orders/Follow-Ups/Clinician Recommendations: Mammogram and Bone Density ordered  You have an order for:  []   2D Mammogram  [x]   3D Mammogram  [x]   Bone Density     Please call for appointment:  [facility]  [address] [place] [address] [phone]  Managing Pain Without Opioids Opioids are strong medicines used to treat moderate to severe pain. For some people, especially those who have long-term (chronic) pain, opioids may not be the best choice for pain management due to: Side effects like nausea, constipation, and sleepiness. The risk of addiction (opioid use disorder). The longer you take opioids, the greater your risk of addiction. Pain that lasts for more than 3 months is called chronic pain. Managing chronic pain usually requires more than one approach and is often provided by a team of health care providers working together (multidisciplinary approach). Pain management may be done at a pain management center or pain clinic. How to manage pain without the use of opioids Use non-opioid medicines Non-opioid medicines for pain may include: Over-the-counter or prescription non-steroidal anti-inflammatory drugs (NSAIDs). These may be the first medicines used for pain. They work well for muscle and bone pain, and they reduce swelling. Acetaminophen. This over-the-counter medicine may work well for milder pain but not swelling. Antidepressants. These may be used to treat chronic pain. A certain type of antidepressant (tricyclics) is often used. These medicines are given in lower doses for pain than when used for depression. Anticonvulsants. These are usually used to treat seizures but  may also reduce nerve (neuropathic) pain. Muscle relaxants. These relieve pain caused by sudden muscle tightening (spasms). You may also use a pain medicine that is applied to the skin as a patch, cream, or gel (topical analgesic), such as a numbing medicine. These may cause fewer side effects than medicines taken by mouth. Do certain therapies as directed Some therapies can help with pain management. They include: Physical therapy. You will do exercises to gain strength and flexibility. A physical therapist may teach you exercises to move and stretch parts of your body that are weak, stiff, or painful. You can learn these exercises at physical therapy visits and practice them at home. Physical therapy may also involve: Massage. Heat wraps or applying heat or cold to affected areas. Electrical signals that interrupt pain signals (transcutaneous electrical nerve stimulation, TENS). Weak lasers that reduce pain and swelling (low-level laser therapy). Signals from your body that help you learn to regulate pain (biofeedback). Occupational therapy. This helps you to learn ways to function at home and work with less pain. Recreational therapy. This involves trying new activities or hobbies, such as a physical activity or drawing. Mental health therapy, including: Cognitive behavioral therapy (CBT). This helps you learn coping skills for dealing with pain. Acceptance and commitment therapy (ACT) to change the way you think and react to pain. Relaxation therapies, including muscle relaxation exercises and mindfulness-based stress reduction. Pain management counseling. This may be individual, family, or group counseling.  Receive medical treatments Medical treatments for pain management include: Nerve block injections. These may include a pain blocker and anti-inflammatory medicines. You may have injections: Near the spine to relieve chronic back or neck pain. Into joints  to relieve back or joint  pain. Into nerve areas that supply a painful area to relieve body pain. Into muscles (trigger point injections) to relieve some painful muscle conditions. A medical device placed near your spine to help block pain signals and relieve nerve pain or chronic back pain (spinal cord stimulation device). Acupuncture. Follow these instructions at home Medicines Take over-the-counter and prescription medicines only as told by your health care provider. If you are taking pain medicine, ask your health care providers about possible side effects to watch out for. Do not drive or use heavy machinery while taking prescription opioid pain medicine. Lifestyle  Do not use drugs or alcohol to reduce pain. If you drink alcohol, limit how much you have to: 0-1 drink a day for women who are not pregnant. 0-2 drinks a day for men. Know how much alcohol is in a drink. In the [place] one drink equals one 12 oz bottle of beer (355 mL), one 5 oz glass of wine (148 mL), or one 1 oz glass of hard liquor (44 mL). Do not use any products that contain nicotine or tobacco. These products include cigarettes, chewing tobacco, and vaping devices, such as e-cigarettes. If you need help quitting, ask your health care provider. Eat a healthy diet and maintain a healthy weight. Poor diet and excess weight may make pain worse. Eat foods that are high in fiber. These include fresh fruits and vegetables, whole grains, and beans. Limit foods that are high in fat and processed sugars, such as fried and sweet foods. Exercise regularly. Exercise lowers stress and may help relieve pain. Ask your health care provider what activities and exercises are safe for you. If your health care provider approves, join an exercise class that combines movement and stress reduction. Examples include yoga and tai chi. Get enough sleep. Lack of sleep may make pain worse. Lower stress as much as possible. Practice stress reduction techniques as told by  your therapist. General instructions Work with all your pain management providers to find the treatments that work best for you. You are an important member of your pain management team. There are many things you can do to reduce pain on your own. Consider joining an online or in-person support group for people who have chronic pain. Keep all follow-up visits. This is important. Where to find more information You can find more information about managing pain without opioids from: American Academy of Pain Medicine: painmed.org Institute for Chronic Pain: instituteforchronicpain.org American Chronic Pain Association: theacpa.org Contact a health care provider if: You have side effects from pain medicine. Your pain gets worse or does not get better with treatments or home therapy. You are struggling with anxiety or depression. Summary Many types of pain can be managed without opioids. Chronic pain may respond better to pain management without opioids. Pain is best managed when you and a team of health care providers work together. Pain management without opioids may include non-opioid medicines, medical treatments, physical therapy, mental health therapy, and lifestyle changes. Tell your health care providers if your pain gets worse or is not being managed well enough. This information is not intended to replace advice given to you by your health care provider. Make sure you discuss any questions you have with your health care provider. Document Revised: 05/24/2020 Document Reviewed: 05/24/2020 Elsevier Patient Education  2024 Elsevier Inc.   Make sure to wear two-piece clothing.  No lotions, powders, or deodorants the day of the appointment. Make sure to bring  picture ID and insurance card.  Bring list of medications you are currently taking including any supplements.   Schedule your [place] screening mammogram through MyChart!   Log into your MyChart account.  Go to 'Visit' (or  'Appointments' if on mobile App) --> Schedule an Appointment  Under 'Select a Reason for Visit' choose the Mammogram Screening option.  Complete the pre-visit questions and select the time and place that best fits your schedule.    This is a list of the screening recommended for you and due dates:  Health Maintenance  Topic Date Due   COVID-19 Vaccine (7 - 2024-25 season) 01/31/2023   Mammogram  04/19/2023   Medicare Annual Wellness Visit  03/24/2024   DTaP/Tdap/Td vaccine (3 - Td or Tdap) 07/18/2031   Pneumonia Vaccine  Completed   Flu Shot  Completed   DEXA scan (bone density measurement)  Completed   Hepatitis C Screening  Completed   Zoster (Shingles) Vaccine  Completed   HPV Vaccine  Aged Out   Colon Cancer Screening  Discontinued   Cologuard (Stool DNA test)  Discontinued    Advanced directives: (Copy Requested) Please bring a copy of your health care power of attorney and living will to the office to be added to your chart at your convenience.  Next Medicare Annual Wellness Visit scheduled for next year: Yes 03/30/24 @ 8:50

## 2023-03-25 NOTE — Progress Notes (Signed)
Subjective:   Brenda Leon is a 78 y.o. female who presents for Medicare Annual (Subsequent) preventive examination.  Visit Complete: Virtual I connected with  Brenda Leon on 03/25/23 by a audio enabled telemedicine application and verified that I am speaking with the correct person using two identifiers. This patient declined Interactive audio and Acupuncturist. Therefore the visit was completed with audio only.   Patient Location: Home  Provider Location: Office/Clinic  I discussed the limitations of evaluation and management by telemedicine. The patient expressed understanding and agreed to proceed.  Vital Signs: Because this visit was a virtual/telehealth visit, some criteria may be missing or patient reported. Any vitals not documented were not able to be obtained and vitals that have been documented are patient reported.  Patient Medicare AWV questionnaire was completed by the patient on 03/19/23; I have confirmed that all information answered by patient is correct and no changes since this date.  Cardiac Risk Factors include: advanced age (>46men, >66 women);dyslipidemia;hypertension     Objective:    Today's Vitals   03/25/23 0850  Weight: 203 lb (92.1 kg)  Height: 5' 7.5" (1.715 m)   Body mass index is 31.33 kg/m.     03/25/2023    9:04 AM 06/10/2022    2:52 PM 05/30/2022    2:11 PM 03/22/2022   11:07 AM 03/08/2021    4:01 PM 11/19/2019    2:12 PM 09/24/2018    1:12 PM  Advanced Directives  Does Patient Have a Medical Advance Directive? Yes No No No No No Yes  Type of Estate agent of [place];Living will      Healthcare Power of [place];Living will  Does patient want to make changes to medical advance directive?       No - Patient declined  Copy of Healthcare Power of Attorney in Chart? No - copy requested      No - copy requested  Would patient like information on creating a medical advance directive?  No - Patient declined Yes  (MAU/Ambulatory/Procedural Areas - Information given) No - Patient declined No - Patient declined Yes (MAU/Ambulatory/Procedural Areas - Information given)     Current Medications (verified) Outpatient Encounter Medications as of 03/25/2023  Medication Sig   acetaminophen (TYLENOL) 650 MG CR tablet Take 1,300 mg by mouth in the morning and at bedtime.   ALPRAZolam (XANAX) 0.25 MG tablet Take 1 tablet (0.25 mg total) by mouth at bedtime as needed for anxiety or sleep.   amLODipine (NORVASC) 2.5 MG tablet Take 1 tablet by mouth once daily   hydroxychloroquine (PLAQUENIL) 200 MG tablet Take 200 mg by mouth 2 (two) times daily.   Omeprazole 20 MG TBEC Take 20 mg by mouth daily.   Propylene Glycol, PF, (SYSTANE COMPLETE PF) 0.6 % SOLN Place 1 drop into both ears daily as needed (Dry eyes).   Red Yeast Rice 600 MG CAPS Take by mouth. Takes two daily   sertraline (ZOLOFT) 25 MG tablet Take 1 tablet (25 mg total) by mouth daily with breakfast.   traMADol (ULTRAM) 50 MG tablet TAKE 1 TABLET BY MOUTH ONCE DAILY AS NEEDED FOR  MODERATE  OR  SEVERE  PAIN   TYRVAYA 0.03 MG/ACT SOLN Place 1 spray into both nostrils in the morning and at bedtime.   atorvastatin (LIPITOR) 20 MG tablet Take 1 tablet (20 mg total) by mouth daily. (Patient not taking: Reported on 03/25/2023)   isosorbide mononitrate (IMDUR) 30 MG 24 hr tablet Take 0.5  tablets (15 mg total) by mouth daily.   [DISCONTINUED] HYDROmorphone (DILAUDID) 2 MG tablet Take 0.5-1 tablets (1-2 mg total) by mouth every 6 (six) hours as needed for severe pain.   [DISCONTINUED] methocarbamol (ROBAXIN) 500 MG tablet Take 1 tablet (500 mg total) by mouth every 6 (six) hours as needed for muscle spasms.   [DISCONTINUED] ondansetron (ZOFRAN) 4 MG tablet Take 1 tablet (4 mg total) by mouth every 6 (six) hours as needed for nausea.   No facility-administered encounter medications on file as of 03/25/2023.    Allergies (verified) Patient has no known allergies.    History: Past Medical History:  Diagnosis Date   Anemia    Arthritis    Cataracts, bilateral    Chicken pox    Diverticulitis    Elevated blood pressure reading    GERD (gastroesophageal reflux disease)    Hypertension    No meds   Lupus    SLE (systemic lupus erythematosus) (HCC)    diagnosed with skin biopsy and serology   Thyroid disease    Past Surgical History:  Procedure Laterality Date   ABDOMINAL HYSTERECTOMY  1987   APPENDECTOMY  1952   BREAST CYST ASPIRATION Right 25 + yrs ago   neg   CATARACT EXTRACTION, BILATERAL Bilateral 1995, 1996   EYE SURGERY  1995   x2 1995 and 1996   KNEE ARTHROSCOPY W/ ACL RECONSTRUCTION     PARTIAL KNEE ARTHROPLASTY Left 06/10/2022   Procedure: Left knee medial unicompartmental arthroplasty;  Surgeon: Ollen Gross, MD;  Location: [facility];  Service: Orthopedics;  Laterality: Left;   STRABISMUS SURGERY Bilateral 09/12/2017   Procedure: BILATERAL STRABISMUS REPAIR;  Surgeon: Verne Carrow, MD;  Location: [facility];  Service: Ophthalmology;  Laterality: Bilateral;   TONSILLECTOMY AND ADENOIDECTOMY     Family History  Problem Relation Age of Onset   Aneurysm Mother        AAA   Other Mother        varicose veins   Hypertension Father    Heart disease Father    Heart attack Father    Diabetes Sister    Hyperlipidemia Sister    Hypertension Sister    Kidney disease Sister    Hyperlipidemia Brother    Hypertension Son    Hyperlipidemia Son    Hyperlipidemia Sister    Breast cancer Neg Hx    Social History   Socioeconomic History   Marital status: Married    Spouse name: Not on file   Number of children: Not on file   Years of education: Not on file   Highest education level: Not on file  Occupational History   Not on file  Tobacco Use   Smoking status: Former    Current packs/day: 0.00    Average packs/day: 2.0 packs/day for 25.0 years (50.0 ttl pk-yrs)    Types: Cigarettes    Start date: 02/25/1961     Quit date: 02/25/1986    Years since quitting: 37.1   Smokeless tobacco: Never  Vaping Use   Vaping status: Never Used  Substance and Sexual Activity   Alcohol use: Yes    Alcohol/week: 7.0 standard drinks of alcohol    Types: 7 Shots of liquor per week    Comment: vodka tonic daily   Drug use: No   Sexual activity: Yes  Other Topics Concern   Not on file  Social History Narrative   Married   Social Drivers of Corporate investment banker  Strain: Low Risk  (03/25/2023)   Overall Financial Resource Strain (CARDIA)    Difficulty of Paying Living Expenses: Not hard at all  Food Insecurity: No Food Insecurity (03/25/2023)   Hunger Vital Sign    Worried About Running Out of Food in the Last Year: Never true    Ran Out of Food in the Last Year: Never true  Transportation Needs: No Transportation Needs (03/25/2023)   PRAPARE - Administrator, Civil Service (Medical): No    Lack of Transportation (Non-Medical): No  Physical Activity: Inactive (03/25/2023)   Exercise Vital Sign    Days of Exercise per Week: 0 days    Minutes of Exercise per Session: 0 min  Stress: Stress Concern Present (03/25/2023)   [employer] of Occupational Health - Occupational Stress Questionnaire    Feeling of Stress : Rather much  Social Connections: Moderately Integrated (03/25/2023)   Social Connection and Isolation Panel [NHANES]    Frequency of Communication with Friends and Family: Twice a week    Frequency of Social Gatherings with Friends and Family: Twice a week    Attends Religious Services: Never    Database administrator or Organizations: Yes    Attends Engineer, structural: More than 4 times per year    Marital Status: Married    Tobacco Counseling Counseling given: Not Answered   Clinical Intake:  Pre-visit preparation completed: Yes  Pain : No/denies pain     BMI - recorded: 31.33 Nutritional Status: BMI > 30  Obese Nutritional Risks: None Diabetes:  No  How often do you need to have someone help you when you read instructions, pamphlets, or other written materials from your doctor or pharmacy?: 1 - Never  Interpreter Needed?: No  Information entered by :: R. Blaise Palladino LPN   Activities of Daily Living    03/19/2023    2:58 PM 06/10/2022    2:53 PM  In your present state of health, do you have any difficulty performing the following activities:  Hearing? 0   Vision? 0   Difficulty concentrating or making decisions? 0   Walking or climbing stairs? 0   Dressing or bathing? 0   Doing errands, shopping? 0 0  Preparing Food and eating ? N   Using the Toilet? N   In the past six months, have you accidently leaked urine? Y   Do you have problems with loss of bowel control? N   Managing your Medications? N   Managing your Finances? N   Housekeeping or managing your Housekeeping? N     Patient Care Team: Sherlene Shams, MD as PCP - General (Internal Medicine) Debbe Odea, MD as PCP - Cardiology (Cardiology) Sherlene Shams, MD (Internal Medicine) Lemar Livings Merrily Pew, MD (General Surgery)  Indicate any recent Medical Services you may have received from other than [facility] providers in the past year (date may be approximate).     Assessment:   This is a routine wellness examination for Rylah.  Hearing/Vision screen Hearing Screening - Comments:: No issues Vision Screening - Comments:: glasses   Goals Addressed             This Visit's Progress    Patient Stated       Wants to start  going to the GYM at least once a week       Depression Screen    03/25/2023    8:57 AM 05/10/2022    3:50 PM 03/22/2022   11:14 AM  09/25/2021   11:14 AM 09/19/2021    8:25 AM 06/07/2021    9:33 AM 04/24/2021    2:01 PM  PHQ 2/9 Scores  PHQ - 2 Score 0 2 0 2 2 4 4   PHQ- 9 Score 2 4  4 4 8 8     Fall Risk    03/19/2023    2:58 PM 05/10/2022    3:49 PM 03/22/2022   11:28 AM 09/25/2021   10:40 AM 09/19/2021    8:25 AM  Fall Risk    Falls in the past year? 0 0 0 1 0  Number falls in past yr: 0 0 0 1   Injury with Fall? 0 0 0 0   Risk for fall due to : No Fall Risks No Fall Risks  History of fall(s)   Follow up Falls prevention discussed;Falls evaluation completed Falls evaluation completed Falls evaluation completed;Falls prevention discussed Falls evaluation completed     MEDICARE RISK AT HOME: Medicare Risk at Home Any stairs in or around the home?: (Patient-Rptd) Yes If so, are there any without handrails?: (Patient-Rptd) Yes Home free of loose throw rugs in walkways, pet beds, electrical cords, etc?: (Patient-Rptd) Yes Adequate lighting in your home to reduce risk of falls?: (Patient-Rptd) Yes Life alert?: (Patient-Rptd) No Use of a cane, walker or w/c?: (Patient-Rptd) No Grab bars in the bathroom?: (Patient-Rptd) Yes Shower chair or bench in shower?: (Patient-Rptd) No Elevated toilet seat or a handicapped toilet?: (Patient-Rptd) No    Cognitive Function:    10/19/2015    9:45 AM  MMSE - Mini Mental State Exam  Orientation to time 5  Orientation to Place 5  Registration 3  Attention/ Calculation 5  Recall 3  Language- name 2 objects 2  Language- repeat 1  Language- follow 3 step command 3  Language- read & follow direction 1  Write a sentence 1  Copy design 1  Total score 30        03/25/2023    9:05 AM 03/22/2022   11:37 AM 09/24/2018    1:12 PM  6CIT Screen  What Year? 0 points 0 points 0 points  What month? 0 points 0 points 0 points  What time? 0 points 0 points 0 points  Count back from 20 0 points  0 points  Months in reverse 0 points  0 points  Repeat phrase 0 points    Total Score 0 points      Immunizations Immunization History  Administered Date(s) Administered   Fluad Quad(high Dose 65+) 12/18/2018, 02/28/2021   Influenza Split 12/11/2011   Influenza,inj,Quad PF,6+ Mos 02/23/2013, 01/05/2014, 11/03/2014, 10/19/2015   Influenza-Unspecified 12/06/2022   Moderna Covid-19  Fall Seasonal Vaccine 83yrs & older 12/06/2022   PFIZER(Purple Top)SARS-COV-2 Vaccination 04/17/2019, 05/11/2019, 12/06/2019, 09/07/2020   Pfizer Covid-19 Vaccine Bivalent Booster 34yrs & up 01/20/2021   Pneumococcal Conjugate-13 10/19/2015   Pneumococcal Polysaccharide-23 02/10/2012   Respiratory Syncytial Virus Vaccine,Recomb Aduvanted(Arexvy) 12/06/2022   Tdap 04/12/2010, 07/17/2021   Zoster Recombinant(Shingrix) 03/06/2018, 07/17/2021   Zoster, Live 01/15/2013    TDAP status: Up to date  Flu Vaccine status: Up to date  Pneumococcal vaccine status: Up to date  Covid-19 vaccine status: Information provided on how to obtain vaccines.   Qualifies for Shingles Vaccine? Yes   Zostavax completed Yes   Shingrix Completed?: Yes  Screening Tests Health Maintenance  Topic Date Due   COVID-19 Vaccine (7 - 2024-25 season) 01/31/2023   Medicare Annual Wellness (AWV)  03/23/2023   DTaP/Tdap/Td (3 -  Td or Tdap) 07/18/2031   Pneumonia Vaccine 41+ Years old  Completed   INFLUENZA VACCINE  Completed   DEXA SCAN  Completed   Hepatitis C Screening  Completed   Zoster Vaccines- Shingrix  Completed   HPV VACCINES  Aged Out   Colonoscopy  Discontinued   Fecal DNA (Cologuard)  Discontinued    Health Maintenance  Health Maintenance Due  Topic Date Due   COVID-19 Vaccine (7 - 2024-25 season) 01/31/2023   Medicare Annual Wellness (AWV)  03/23/2023    Colorectal cancer screening: Type of screening: Cologuard. Completed 03/2022. Repeat every 3 years  Mammogram status: Ordered 03/25/23. Pt provided with contact info and advised to call to schedule appt.   Bone Density status: Ordered 03/25/23. Pt provided with contact info and advised to call to schedule appt.  Lung Cancer Screening: (Low Dose CT Chest recommended if Age 32-80 years, 20 pack-year currently smoking OR have quit w/in 15years.) does not qualify.     Additional Screening:  Hepatitis C Screening: does qualify; Completed  09/2015  Vision Screening: Recommended annual ophthalmology exams for early detection of glaucoma and other disorders of the eye. Is the patient up to date with their annual eye exam?  Yes  Who is the provider or what is the name of the office in which the patient attends annual eye exams? [facility]  If pt is not established with a provider, would they like to be referred to a provider to establish care? No .   Dental Screening: Recommended annual dental exams for proper oral hygiene  Community Resource Referral / Chronic Care Management: CRR required this visit?  No   CCM required this visit?  No     Plan:     I have personally reviewed and noted the following in the patient's chart:   Medical and social history Use of alcohol, tobacco or illicit drugs  Current medications and supplements including opioid prescriptions. Patient is currently taking opioid prescriptions. Information provided to patient regarding non-opioid alternatives. Patient advised to discuss non-opioid treatment plan with their provider. Functional ability and status Nutritional status Physical activity Advanced directives List of other physicians Hospitalizations, surgeries, and ER visits in previous 12 months Vitals Screenings to include cognitive, depression, and falls Referrals and appointments  In addition, I have reviewed and discussed with patient certain preventive protocols, quality metrics, and best practice recommendations. A written personalized care plan for preventive services as well as general preventive health recommendations were provided to patient.     Sydell Axon, LPN   9/81/1914   After Visit Summary: (MyChart) Due to this being a telephonic visit, the after visit summary with patients personalized plan was offered to patient via MyChart   Nurse Notes: None

## 2023-04-11 DIAGNOSIS — H353131 Nonexudative age-related macular degeneration, bilateral, early dry stage: Secondary | ICD-10-CM | POA: Diagnosis not present

## 2023-04-11 DIAGNOSIS — H04123 Dry eye syndrome of bilateral lacrimal glands: Secondary | ICD-10-CM | POA: Diagnosis not present

## 2023-04-11 DIAGNOSIS — Z79899 Other long term (current) drug therapy: Secondary | ICD-10-CM | POA: Diagnosis not present

## 2023-04-17 ENCOUNTER — Telehealth: Payer: Self-pay | Admitting: Internal Medicine

## 2023-04-17 DIAGNOSIS — R7303 Prediabetes: Secondary | ICD-10-CM

## 2023-04-17 DIAGNOSIS — E785 Hyperlipidemia, unspecified: Secondary | ICD-10-CM

## 2023-04-17 DIAGNOSIS — I700 Atherosclerosis of aorta: Secondary | ICD-10-CM

## 2023-04-17 NOTE — Telephone Encounter (Signed)
 Patient need lab orders.

## 2023-04-17 NOTE — Assessment & Plan Note (Signed)
Incidental finding on CT angiogram done during ER evaluation  In Nov 2020 for COVID RELATED respiratory failure, again seen on July 2023 chest x ray.  She has stopped taking hi potency statin therapy as of Jan 2025 for unclear reasons..  repeat lipids needed  Lab Results  Component Value Date   CHOL 148 10/04/2021   HDL 55 10/04/2021   LDLCALC 75 10/04/2021   LDLDIRECT 79 10/04/2021   TRIG 91 10/04/2021   CHOLHDL 2.7 10/04/2021

## 2023-04-22 ENCOUNTER — Ambulatory Visit
Admission: RE | Admit: 2023-04-22 | Discharge: 2023-04-22 | Disposition: A | Discharge status: Home / Self Care | Payer: Self-pay | Source: Ambulatory Visit | Attending: Internal Medicine | Admitting: Internal Medicine

## 2023-04-22 ENCOUNTER — Ambulatory Visit
Admission: RE | Admit: 2023-04-22 | Discharge: 2023-04-22 | Disposition: A | Discharge status: Home / Self Care | Payer: PPO | Source: Ambulatory Visit | Attending: Internal Medicine | Admitting: Internal Medicine

## 2023-04-22 DIAGNOSIS — Z780 Asymptomatic menopausal state: Secondary | ICD-10-CM

## 2023-04-22 DIAGNOSIS — Z1231 Encounter for screening mammogram for malignant neoplasm of breast: Secondary | ICD-10-CM | POA: Diagnosis not present

## 2023-04-23 ENCOUNTER — Encounter: Payer: Self-pay | Admitting: Internal Medicine

## 2023-04-23 DIAGNOSIS — Z780 Asymptomatic menopausal state: Secondary | ICD-10-CM | POA: Insufficient documentation

## 2023-04-24 ENCOUNTER — Other Ambulatory Visit (INDEPENDENT_AMBULATORY_CARE_PROVIDER_SITE_OTHER): Payer: PPO

## 2023-04-24 DIAGNOSIS — R7303 Prediabetes: Secondary | ICD-10-CM | POA: Diagnosis not present

## 2023-04-24 DIAGNOSIS — E785 Hyperlipidemia, unspecified: Secondary | ICD-10-CM

## 2023-04-24 LAB — COMPREHENSIVE METABOLIC PANEL
ALT: 12 U/L (ref 0–35)
AST: 16 U/L (ref 0–37)
Albumin: 4.2 g/dL (ref 3.5–5.2)
Alkaline Phosphatase: 50 U/L (ref 39–117)
BUN: 21 mg/dL (ref 6–23)
CO2: 30 meq/L (ref 19–32)
Calcium: 9 mg/dL (ref 8.4–10.5)
Chloride: 105 meq/L (ref 96–112)
Creatinine, Ser: 0.7 mg/dL (ref 0.40–1.20)
GFR: 83.92 mL/min (ref >60.00)
Glucose, Bld: 94 mg/dL (ref 70–99)
Potassium: 4.3 meq/L (ref 3.5–5.1)
Sodium: 140 meq/L (ref 135–145)
Total Bilirubin: 0.4 mg/dL (ref 0.2–1.2)
Total Protein: 6.8 g/dL (ref 6.0–8.3)

## 2023-04-24 LAB — LIPID PANEL
Cholesterol: 212 mg/dL — ABNORMAL HIGH (ref 0–200)
HDL: 54.5 mg/dL (ref >39.00)
LDL Cholesterol: 137 mg/dL — ABNORMAL HIGH (ref 0–99)
NonHDL: 157.77
Total CHOL/HDL Ratio: 4
Triglycerides: 105 mg/dL (ref 0.0–149.0)
VLDL: 21 mg/dL (ref 0.0–40.0)

## 2023-04-24 LAB — LDL CHOLESTEROL, DIRECT: Direct LDL: 140 mg/dL

## 2023-04-24 LAB — HEMOGLOBIN A1C: Hgb A1c MFr Bld: 6 % (ref 4.6–6.5)

## 2023-04-25 ENCOUNTER — Ambulatory Visit (INDEPENDENT_AMBULATORY_CARE_PROVIDER_SITE_OTHER): Payer: PPO | Admitting: Internal Medicine

## 2023-04-25 ENCOUNTER — Encounter: Payer: Self-pay | Admitting: Internal Medicine

## 2023-04-25 VITALS — BP 124/60 | HR 78 | Temp 99.0°F | Ht 67.5 in | Wt 202.2 lb

## 2023-04-25 DIAGNOSIS — Z780 Asymptomatic menopausal state: Secondary | ICD-10-CM | POA: Diagnosis not present

## 2023-04-25 DIAGNOSIS — N3941 Urge incontinence: Secondary | ICD-10-CM

## 2023-04-25 DIAGNOSIS — E785 Hyperlipidemia, unspecified: Secondary | ICD-10-CM

## 2023-04-25 DIAGNOSIS — M3211 Endocarditis in systemic lupus erythematosus: Secondary | ICD-10-CM

## 2023-04-25 DIAGNOSIS — I700 Atherosclerosis of aorta: Secondary | ICD-10-CM | POA: Diagnosis not present

## 2023-04-25 DIAGNOSIS — Z0000 Encounter for general adult medical examination without abnormal findings: Secondary | ICD-10-CM | POA: Diagnosis not present

## 2023-04-25 DIAGNOSIS — R010 Benign and innocent cardiac murmurs: Secondary | ICD-10-CM | POA: Diagnosis not present

## 2023-04-25 DIAGNOSIS — M8580 Other specified disorders of bone density and structure, unspecified site: Secondary | ICD-10-CM | POA: Diagnosis not present

## 2023-04-25 DIAGNOSIS — M1712 Unilateral primary osteoarthritis, left knee: Secondary | ICD-10-CM | POA: Diagnosis not present

## 2023-04-25 MED ORDER — ALPRAZOLAM 0.25 MG PO TABS: 0.2500 mg | ORAL_TABLET | Freq: Every evening | ORAL | 5 refills | Status: DC | PRN

## 2023-04-25 MED ORDER — TRAMADOL HCL 50 MG PO TABS: 50.0000 mg | ORAL_TABLET | Freq: Every day | ORAL | 5 refills | Status: DC | PRN

## 2023-04-25 NOTE — Assessment & Plan Note (Signed)
 Stable disease on Placquenil .  Reviewed Last app with Rheumatolgy reviewed  Allena Katz,  [employer]   Oct 2024

## 2023-04-25 NOTE — Assessment & Plan Note (Addendum)
 Mild MR and AR by 2023 EHCO,  normal EF , mildly dilated LA .  She is asymptomatic but currently sedentary

## 2023-04-25 NOTE — Patient Instructions (Addendum)
 To help curb appetite,  Try using a powdered bulk forming laxative in 8 ounces of water 30 minutes prior to your biggest  meal.  We should Check urine tat some point  for infection.  Limit caffeine ,  it causes urinary frequency  Tramadol and alprazolam have been refilled

## 2023-04-25 NOTE — Assessment & Plan Note (Signed)
 Incidental finding on CT angiogram done during ER evaluation  In Nov 2020 for COVID RELATED respiratory failure.  Statin therapy has been repeatedly advised but deferred by patient  In favor of nutraceuticals (red yeast rice)  Lab Results  Component Value Date   CHOL 212 (H) 04/24/2023   HDL 54.50 04/24/2023   LDLCALC 137 (H) 04/24/2023   LDLDIRECT 140.0 04/24/2023   TRIG 105.0 04/24/2023   CHOLHDL 4 04/24/2023

## 2023-04-25 NOTE — Progress Notes (Signed)
 Patient ID: Brenda Leon, female    DOB: 06/26/1945  Age: 78 y.o. MRN: [id number]  The patient is here for annual preventive examination and management of other chronic and acute problems.   The risk factors are reflected in the social history.   The roster of all physicians providing medical care to patient - is listed in the Snapshot section of the chart.   Activities of daily living:  The patient is 100% independent in all ADLs: dressing, toileting, feeding as well as independent mobility   Home safety : The patient has smoke detectors in the home. They wear seatbelts.  There are no unsecured firearms at home. There is no violence in the home.    There is no risks for hepatitis, STDs or HIV. There is no   history of blood transfusion. They have no travel history to infectious disease endemic areas of the world.   The patient has seen their dentist in the last six month. They have seen their eye doctor in the last year. The patinet  denies slight hearing difficulty with regard to whispered voices and some television programs.  They have deferred audiologic testing in the last year.  They do not  have excessive sun exposure. Discussed the need for sun protection: hats, long sleeves and use of sunscreen if there is significant sun exposure.    Diet: the importance of a healthy diet is discussed.  She does have a healthy diet.   The benefits of regular aerobic exercise were discussed. The patient  does not exercise regularly.    Depression screen: there are no signs or vegative symptoms of depression- irritability, change in appetite, anhedonia, sadness/tearfullness.   The following portions of the patient's history were reviewed and updated as appropriate: allergies, current medications, past family history, past medical history,  past surgical history, past social history  and problem list.   Visual acuity was not assessed per patient preference since the patient has regular follow up with  an  ophthalmologist. Hearing and body mass index were assessed and reviewed.    During the course of the visit the patient was educated and counseled about appropriate screening and preventive services including : fall prevention , diabetes screening, nutrition counseling, colorectal cancer screening, and recommended immunizations.    Chief Complaint:   1) osteopenia; reviewed recent DEXA and discussed  calcium and weight bearing exercise. Having trouble returning to gym    2) back pain was aggravated by 20 minute positioning of DEXA . Using tylenol   3) obesity: weight has been on a plateau for the past year after losing 11 lbs.   4) urinary urge incontinence :  using Poise  5) left knee pain:  started  after banging it against  an open desk drawer (lateral shove)  at work   she is s/p uni KR in April . The pain radiates from the knee to hip.  She had a unicompartmental knee replacement 2 years ago.   6) atypical chest pain :  saw Dr Adolm Joseph for evaluation.  She was prescribed  Imdur  for one month .  Stopped taking it. She feels that the symptoms are due to anxiety   Review of Symptoms  Patient denies headache, fevers, malaise, unintentional weight loss, skin rash, eye pain, sinus congestion and sinus pain, sore throat, dysphagia,  hemoptysis , cough, dyspnea, wheezing, chest pain, palpitations, orthopnea, edema, abdominal pain, nausea, melena, diarrhea, constipation, flank pain, dysuria, hematuria, urinary  Frequency, nocturia, numbness, tingling,  seizures,  Focal weakness, Loss of consciousness,  Tremor, insomnia, depression, anxiety, and suicidal ideation.    Physical Exam:  BP 124/60   Pulse 78   Temp 99 F (37.2 C)   Ht 5' 7.5" (1.715 m)   Wt 202 lb 4 oz (91.7 kg)   SpO2 97%   BMI 31.21 kg/m    Physical Exam Vitals reviewed.  Constitutional:      General: She is not in acute distress.    Appearance: Normal appearance. She is normal weight. She is not ill-appearing,  toxic-appearing or diaphoretic.  HENT:     Head: Normocephalic.  Eyes:     General: No scleral icterus.       Right eye: No discharge.        Left eye: No discharge.     Conjunctiva/sclera: Conjunctivae normal.  Cardiovascular:     Rate and Rhythm: Normal rate and regular rhythm.     Heart sounds: Normal heart sounds.  Pulmonary:     Effort: Pulmonary effort is normal. No respiratory distress.     Breath sounds: Normal breath sounds.  Musculoskeletal:        General: Normal range of motion.  Skin:    General: Skin is warm and dry.  Neurological:     General: No focal deficit present.     Mental Status: She is alert and oriented to person, place, and time. Mental status is at baseline.  Psychiatric:        Mood and Affect: Mood normal.        Behavior: Behavior normal.        Thought Content: Thought content normal.        Judgment: Judgment normal.    Assessment and Plan: Routine general medical examination at a health care facility Assessment & Plan: age appropriate education and counseling updated, referrals for preventative services and immunizations addressed, dietary and smoking counseling addressed, most recent labs reviewed.  I have personally reviewed and have noted:   1) the patient's medical and social history 2) The pt's use of alcohol, tobacco, and illicit drugs 3) The patient's current medications and supplements 4) Functional ability including ADL's, fall risk, home safety risk, hearing and visual impairment 5) Diet and physical activities 6) Evidence for depression or mood disorder 7) The patient's height, weight, and BMI have been recorded in the chart  I have made referrals, and provided counseling and education based on review of the above    Thoracic aortic atherosclerosis [facility]) Assessment & Plan: Incidental finding on CT angiogram done during ER evaluation  In Nov 2020 for COVID RELATED respiratory failure.  Statin therapy has been repeatedly advised  but deferred by patient  In favor of nutraceuticals (red yeast rice)  Lab Results  Component Value Date   CHOL 212 (H) 04/24/2023   HDL 54.50 04/24/2023   LDLCALC 137 (H) 04/24/2023   LDLDIRECT 140.0 04/24/2023   TRIG 105.0 04/24/2023   CHOLHDL 4 04/24/2023      Osteopenia after menopause Assessment & Plan: Reviewed DEXA.  Calcium needs reviewed.    Functional systolic murmur Assessment & Plan: Mild MR and AR by 2023 EHCO,  normal EF , mildly dilated LA .  She is asymptomatic but currently sedentary    Other systemic lupus erythematosus with endocarditis [facility]) Assessment & Plan: Stable disease on Placquenil .  Reviewed Last app with Rheumatolgy reviewed  [facility]   Oct 2024     Urge incontinence of urine Assessment &  Plan: She has declined workup with UA/culture at this point and does not want pharmacotherapy  Orders: -     Urinalysis, Routine w reflex microscopic; Future -     Urine Culture; Future  Primary osteoarthritis of left knee Assessment & Plan: S/p unicompartmental replacement by Elwyn Reach in 2024.    recent blunt trauma reviewed . Reassured that knocking it laterally from turning into an open draw is unlikely  to have resulted in hardware failure    Hyperlipidemia with target LDL less than 130 Assessment & Plan: She has declined START OF  atorvastatin for concurrent fatty liver management and has continued RYR   and flaxseed oil.   Lab Results  Component Value Date   CHOL 212 (H) 04/24/2023   HDL 54.50 04/24/2023   LDLCALC 137 (H) 04/24/2023   LDLDIRECT 140.0 04/24/2023   TRIG 105.0 04/24/2023   CHOLHDL 4 04/24/2023      Other orders -     ALPRAZolam; Take 1 tablet (0.25 mg total) by mouth at bedtime as needed for anxiety or sleep.  Dispense: 30 tablet; Refill: 5 -     traMADol HCl; Take 1 tablet (50 mg total) by mouth daily as needed.  Dispense: 30 tablet; Refill: 5    Return in about 6 months (around  10/23/2023).  Sherlene Shams, MD

## 2023-04-25 NOTE — Assessment & Plan Note (Signed)

## 2023-04-25 NOTE — Assessment & Plan Note (Signed)
 Reviewed DEXA.  Calcium needs reviewed.

## 2023-04-26 ENCOUNTER — Encounter: Payer: Self-pay | Admitting: Internal Medicine

## 2023-04-26 DIAGNOSIS — N3941 Urge incontinence: Secondary | ICD-10-CM | POA: Insufficient documentation

## 2023-04-26 NOTE — Assessment & Plan Note (Signed)
 She has declined workup with UA/culture at this point and does not want pharmacotherapy

## 2023-04-26 NOTE — Assessment & Plan Note (Addendum)
 S/p unicompartmental replacement by Brenda Leon in 2024.    recent blunt trauma reviewed . Reassured that knocking it laterally from turning into an open draw is unlikely  to have resulted in hardware failure

## 2023-04-26 NOTE — Assessment & Plan Note (Signed)
 She has declined START OF  atorvastatin for concurrent fatty liver management and has continued RYR   and flaxseed oil.   Lab Results  Component Value Date   CHOL 212 (H) 04/24/2023   HDL 54.50 04/24/2023   LDLCALC 137 (H) 04/24/2023   LDLDIRECT 140.0 04/24/2023   TRIG 105.0 04/24/2023   CHOLHDL 4 04/24/2023

## 2023-04-28 ENCOUNTER — Other Ambulatory Visit: Payer: Self-pay

## 2023-04-28 NOTE — Telephone Encounter (Signed)
 Received a fax from pharmacy requested a new rx for Tramadol. The one that was sent in they were only able to fill for a 7 day supply. They are requesting one for a 30 day supply.

## 2023-04-29 NOTE — Telephone Encounter (Signed)
 Duplicate request. Request needs to be refused.

## 2023-05-12 ENCOUNTER — Other Ambulatory Visit: Payer: Self-pay | Admitting: Internal Medicine

## 2023-05-30 ENCOUNTER — Other Ambulatory Visit: Payer: Self-pay | Admitting: Internal Medicine

## 2023-05-30 ENCOUNTER — Ambulatory Visit: Payer: Self-pay

## 2023-05-30 ENCOUNTER — Encounter: Payer: Self-pay | Admitting: Internal Medicine

## 2023-05-30 DIAGNOSIS — M329 Systemic lupus erythematosus, unspecified: Secondary | ICD-10-CM | POA: Diagnosis not present

## 2023-05-30 DIAGNOSIS — M19042 Primary osteoarthritis, left hand: Secondary | ICD-10-CM | POA: Diagnosis not present

## 2023-05-30 DIAGNOSIS — M19041 Primary osteoarthritis, right hand: Secondary | ICD-10-CM | POA: Diagnosis not present

## 2023-05-30 DIAGNOSIS — Z79899 Other long term (current) drug therapy: Secondary | ICD-10-CM | POA: Diagnosis not present

## 2023-05-30 MED ORDER — AMOXICILLIN-POT CLAVULANATE 875-125 MG PO TABS: 1.0000 | ORAL_TABLET | Freq: Two times a day (BID) | ORAL | 0 refills | Status: DC

## 2023-05-30 MED ORDER — PREDNISONE 10 MG PO TABS: ORAL_TABLET | ORAL | 0 refills | Status: DC

## 2023-05-30 NOTE — Telephone Encounter (Signed)
  Chief Complaint: sinus congestion  Symptoms: nasal drainage; cough   Disposition: [] ED /[] Urgent Care (no appt availability in office) / [] Appointment(In office/virtual)/ []  [facility]/ [] Home Care/ [x] Refused Recommended Disposition /[] [facility]/ []  Follow-up with PCP Additional Notes: Pt called with sinus congestion that has been present for 12 days. Pt started with runny nose and worked in garden and symptoms became significantly worse. Pt has been treating symptoms with Mucinex Sinus Max. Pt denies fever and SOB. Pt states she is too busy for appt and shared, "Dr Darrick Huntsman is very accommodating since I'm not a frequent flyer." Please inform pt if antibiotic and be filled with or without an appt. RN gave care advice and pt verbalized understanding.                 Copied from CRM [id number]. Topic: Clinical - Medication Question >> May 30, 2023 10:04 AM Cammy Copa D wrote: Reason for CRM: PT calling requesting an antibiotic, PT has had a sinus infection that she's had 2 weeks and hasn't seen improvement. Reason for Disposition  [1] Sinus congestion (pressure, fullness) AND [2] present > 10 days  Answer Assessment - Initial Assessment Questions 1. LOCATION: "Where does it hurt?"      Behind eyes  2. ONSET: "When did the sinus pain start?"  (e.g., hours, days)      12 days  3. SEVERITY: "How bad is the pain?"   (Scale 1-10; mild, moderate or severe)   - MILD (1-3): doesn't interfere with normal activities    - MODERATE (4-7): interferes with normal activities (e.g., work or school) or awakens from sleep   - SEVERE (8-10): excruciating pain and patient unable to do any normal activities        2-3  6. NASAL DISCHARGE: "Do you have discharge from your nose?" If so ask, "What color?"     Yellow, thick  7. FEVER: "Do you have a fever?" If Yes, ask: "What is it, how was it measured, and when did it start?"      Denies  8. OTHER SYMPTOMS: "Do you have any other  symptoms?" (e.g., sore throat, cough, earache, difficulty breathing)     Cough, yellow  Protocols used: Sinus Pain or Congestion-A-AH

## 2023-06-02 MED ORDER — AZELASTINE HCL 0.05 % OP SOLN: 1.0000 [drp] | Freq: Two times a day (BID) | OPHTHALMIC | 12 refills | Status: DC

## 2023-07-03 ENCOUNTER — Other Ambulatory Visit: Payer: Self-pay | Admitting: Internal Medicine

## 2023-07-15 ENCOUNTER — Telehealth: Payer: Self-pay

## 2023-07-15 NOTE — Telephone Encounter (Signed)
Information has been given to pt

## 2023-07-15 NOTE — Telephone Encounter (Signed)
 Copied from CRM [id number]. Topic: Referral - Question >> Jul 15, 2023 12:36 PM [place] J wrote: Reason for CRM: Patient was wondering if Dr. Madelon Scheuermann had a good dental recommendation. The patient needs a cleaning done and wanted to be seen by a good dental office.

## 2023-07-29 ENCOUNTER — Encounter: Payer: Self-pay | Admitting: Internal Medicine

## 2023-08-06 ENCOUNTER — Encounter: Payer: Self-pay | Admitting: Cardiology

## 2023-08-06 ENCOUNTER — Other Ambulatory Visit: Payer: Self-pay | Admitting: Internal Medicine

## 2023-08-08 ENCOUNTER — Ambulatory Visit: Attending: Cardiology | Admitting: Cardiology

## 2023-08-08 VITALS — BP 130/59 | HR 77 | Ht 67.0 in | Wt 205.0 lb

## 2023-08-08 DIAGNOSIS — I2510 Atherosclerotic heart disease of native coronary artery without angina pectoris: Secondary | ICD-10-CM | POA: Diagnosis not present

## 2023-08-08 DIAGNOSIS — E785 Hyperlipidemia, unspecified: Secondary | ICD-10-CM

## 2023-08-08 DIAGNOSIS — R011 Cardiac murmur, unspecified: Secondary | ICD-10-CM

## 2023-08-08 NOTE — Patient Instructions (Signed)

## 2023-08-08 NOTE — Progress Notes (Signed)
 Cardiology Office Note:    Date:  08/08/2023   ID:  Brenda Leon, DOB 06/29/1945, MRN [id number]  PCP:  Thersia Flax, MD   [facility] Providers Cardiologist:  Constancia Delton, MD     Referring MD: Thersia Flax, MD   Chief Complaint  Patient presents with   Follow-up    1 year follow up    History of Present Illness:    Brenda Leon is a 78 y.o. female with a hx of Nonobstructive CAD (CCTA 8/23 mild stenosis in mid LAD, RCA, minimal stenosis in proximal left circumflex disease, hyperlipidemia, former smoker x 20 years GERD, SLE who presents for follow-up.  Patient denies chest pain or shortness of breath.  Previously prescribed statin, stopped taking, wants alternative therapies, currently takes red yeast.  Overall feeling well, has no cardiac concerns at this time.  Prior notes/testing Echo 09/2018 EF 60 to 65% CCTA 8/23 mild LAD, RCA, minimal LCx disease.  Past Medical History:  Diagnosis Date   Anemia    Arthritis    Cataracts, bilateral    Chicken pox    Diverticulitis    Elevated blood pressure reading    GERD (gastroesophageal reflux disease)    Hypertension    No meds   Lupus    SLE (systemic lupus erythematosus) (HCC)    diagnosed with skin biopsy and serology   Thyroid  disease     Past Surgical History:  Procedure Laterality Date   ABDOMINAL HYSTERECTOMY  1987   APPENDECTOMY  1952   BREAST CYST ASPIRATION Right 25 + yrs ago   neg   CATARACT EXTRACTION, BILATERAL Bilateral 1995, 1996   EYE SURGERY  1995   x2 1995 and 1996   KNEE ARTHROSCOPY W/ ACL RECONSTRUCTION     PARTIAL KNEE ARTHROPLASTY Left 06/10/2022   Procedure: Left knee medial unicompartmental arthroplasty;  Surgeon: Liliane Rei, MD;  Location: [facility];  Service: Orthopedics;  Laterality: Left;   STRABISMUS SURGERY Bilateral 09/12/2017   Procedure: BILATERAL STRABISMUS REPAIR;  Surgeon: Dorothey Gate, MD;  Location: [facility];  Service: Ophthalmology;   Laterality: Bilateral;   TONSILLECTOMY AND ADENOIDECTOMY      Current Medications: Current Meds  Medication Sig   ALPRAZolam  (XANAX ) 0.25 MG tablet Take 1 tablet (0.25 mg total) by mouth at bedtime as needed for anxiety or sleep.   amLODipine  (NORVASC ) 2.5 MG tablet Take 1 tablet by mouth once daily   aspirin  EC 81 MG tablet Take 81 mg by mouth daily. Swallow whole.   B Complex-C (SUPER B COMPLEX PO) Take 1 tablet by mouth daily.   Cholecalciferol (VITAMIN D3) 50 MCG (2000 UT) capsule    Flaxseed Oil OIL Take 1 capsule by mouth daily.   hydroxychloroquine (PLAQUENIL) 200 MG tablet Take 200 mg by mouth 2 (two) times daily.   hydroxychloroquine (PLAQUENIL) 200 MG tablet Take 1 tablet by mouth 2 (two) times daily.   MAGNESIUM GLYCINATE PO Take 1 capsule by mouth daily.   Multiple Vitamins-Minerals (PRESERVISION AREDS 2+MULTI VIT PO) Take 2 capsules by mouth daily.   Omeprazole  20 MG TBEC Take 20 mg by mouth daily.   Propylene Glycol, PF, (SYSTANE COMPLETE PF) 0.6 % SOLN Place 1 drop into both ears daily as needed (Dry eyes).   Red Yeast Rice 600 MG CAPS Take by mouth. Takes two daily   sertraline  (ZOLOFT ) 50 MG tablet Take 1 tablet by mouth once daily with breakfast   TYRVAYA 0.03 MG/ACT SOLN Place  1 spray into both nostrils in the morning and at bedtime.     Allergies:   Patient has no known allergies.   Social History   Socioeconomic History   Marital status: Married    Spouse name: Not on file   Number of children: Not on file   Years of education: Not on file   Highest education level: Not on file  Occupational History   Not on file  Tobacco Use   Smoking status: Former    Current packs/day: 0.00    Average packs/day: 2.0 packs/day for 25.0 years (50.0 ttl pk-yrs)    Types: Cigarettes    Start date: 02/25/1961    Quit date: 02/25/1986    Years since quitting: 37.4   Smokeless tobacco: Never  Vaping Use   Vaping status: Never Used  Substance and Sexual Activity   Alcohol  use: Yes    Alcohol/week: 7.0 standard drinks of alcohol    Types: 7 Shots of liquor per week    Comment: vodka tonic daily   Drug use: No   Sexual activity: Yes  Other Topics Concern   Not on file  Social History Narrative   Married   Social Drivers of Health   Financial Resource Strain: Low Risk  (03/25/2023)   Overall Financial Resource Strain (CARDIA)    Difficulty of Paying Living Expenses: Not hard at all  Food Insecurity: No Food Insecurity (03/25/2023)   Hunger Vital Sign    Worried About Running Out of Food in the Last Year: Never true    Ran Out of Food in the Last Year: Never true  Transportation Needs: No Transportation Needs (03/25/2023)   PRAPARE - Administrator, Civil Service (Medical): No    Lack of Transportation (Non-Medical): No  Physical Activity: Inactive (03/25/2023)   Exercise Vital Sign    Days of Exercise per Week: 0 days    Minutes of Exercise per Session: 0 min  Stress: Stress Concern Present (03/25/2023)   [employer] of Occupational Health - Occupational Stress Questionnaire    Feeling of Stress : Rather much  Social Connections: Moderately Integrated (03/25/2023)   Social Connection and Isolation Panel    Frequency of Communication with Friends and Family: Twice a week    Frequency of Social Gatherings with Friends and Family: Twice a week    Attends Religious Services: Never    Database administrator or Organizations: Yes    Attends Engineer, structural: More than 4 times per year    Marital Status: Married     Family History: The patient's family history includes Aneurysm in her mother; Diabetes in her sister; Heart attack in her father; Heart disease in her father; Hyperlipidemia in her brother, sister, sister, and son; Hypertension in her father, sister, and son; Kidney disease in her sister; Other in her mother. There is no history of Breast cancer.  ROS:   Please see the history of present illness.     All other  systems reviewed and are negative.  EKGs/Labs/Other Studies Reviewed:    The following studies were reviewed today:  EKG Interpretation Date/Time:  Friday August 08 2023 14:30:13 EDT Ventricular Rate:  77 PR Interval:  206 QRS Duration:  88 QT Interval:  400 QTC Calculation: 452 R Axis:   -1  Text Interpretation: Normal sinus rhythm Minimal voltage criteria for LVH, may be normal variant ( R in aVL ) Confirmed by Constancia Delton ([id number]) on 08/08/2023 2:42:32 PM  Recent Labs: 04/24/2023: ALT 12; BUN 21; Creatinine, Ser 0.70; Potassium 4.3; Sodium 140  Recent Lipid Panel    Component Value Date/Time   CHOL 212 (H) 04/24/2023 0818   TRIG 105.0 04/24/2023 0818   HDL 54.50 04/24/2023 0818   CHOLHDL 4 04/24/2023 0818   VLDL 21.0 04/24/2023 0818   LDLCALC 137 (H) 04/24/2023 0818   LDLDIRECT 140.0 04/24/2023 0818     Risk Assessment/Calculations:         Physical Exam:    VS:  BP (!) 130/59 (BP Location: Left Arm, Patient Position: Sitting, Cuff Size: Normal)   Pulse 77   Ht 5' 7 (1.702 m)   Wt 205 lb (93 kg)   SpO2 97%   BMI 32.11 kg/m     Wt Readings from Last 3 Encounters:  08/08/23 205 lb (93 kg)  04/25/23 202 lb 4 oz (91.7 kg)  03/25/23 203 lb (92.1 kg)     GEN:  Well nourished, well developed in no acute distress HEENT: Normal NECK: No JVD; No carotid bruits CARDIAC: RRR, 2/6 systolic murmur RESPIRATORY:  Clear to auscultation without rales, wheezing or rhonchi  ABDOMEN: Soft, non-tender, non-distended MUSCULOSKELETAL:  No edema; No deformity  SKIN: Warm and dry NEUROLOGIC:  Alert and oriented x 3 PSYCHIATRIC:  Normal affect   ASSESSMENT:    1. Coronary artery disease involving native coronary artery of native heart, unspecified whether angina present   2. Hyperlipidemia LDL goal <70   3. Systolic murmur     PLAN:    In order of problems listed above:  Nonobstructive CAD, CCTA 8/23 mild stenosis in the mid LAD and RCA, minimal stenosis in  proximal left circumflex disease.  Continue aspirin  81 mg daily.  Patient declines statin. Hyperlipidemia, LDL not at goal.  Patient declined statin.  Low-cholesterol diet advised. 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  Monitor with serial echoes, repeat echo in about 1-2 years.  Follow-up in 1 year.     Medication Adjustments/Labs and Tests Ordered: Current medicines are reviewed at length with the patient today.  Concerns regarding medicines are outlined above.  Orders Placed This Encounter  Procedures   EKG 12-Lead   No orders of the defined types were placed in this encounter.   Patient Instructions  Medication Instructions:  Your physician recommends that you continue on your current medications as directed. Please refer to the Current Medication list given to you today.   *If you need a refill on your cardiac medications before your next appointment, please call your pharmacy*  Lab Work: No labs ordered today  If you have labs (blood work) drawn today and your tests are completely normal, you will receive your results only by: MyChart Message (if you have MyChart) OR A paper copy in the mail If you have any lab test that is abnormal or we need to change your treatment, we will call you to review the results.  Testing/Procedures: No test ordered today   Follow-Up: At [facility], you and your health needs are our priority.  As part of our continuing mission to provide you with exceptional heart care, our providers are all part of one team.  This team includes your primary Cardiologist (physician) and Advanced Practice Providers or APPs (Physician Assistants and Nurse Practitioners) who all work together to provide you with the care you need, when you need it.  Your next appointment:   1 year(s)  Provider:   You may see Constancia Delton, MD  or one of the following Advanced Practice Providers on your designated Care Team:   Laneta Pintos,  NP Gildardo Labrador, PA-C Varney Gentleman, PA-C Cadence Kooskia, PA-C Ronald Cockayne, NP Morey Ar, NP    We recommend signing up for the patient portal called MyChart.  Sign up information is provided on this After Visit Summary.  MyChart is used to connect with patients for Virtual Visits (Telemedicine).  Patients are able to view lab/test results, encounter notes, upcoming appointments, etc.  Non-urgent messages can be sent to your provider as well.   To learn more about what you can do with MyChart, go to [email or web address].      Signed, Constancia Delton, MD  08/08/2023 3:21 PM     [facility]

## 2023-08-21 ENCOUNTER — Other Ambulatory Visit: Payer: Self-pay | Admitting: Internal Medicine

## 2023-09-24 DIAGNOSIS — M25551 Pain in right hip: Secondary | ICD-10-CM | POA: Diagnosis not present

## 2023-09-25 ENCOUNTER — Other Ambulatory Visit: Payer: Self-pay | Admitting: Internal Medicine

## 2023-10-07 DIAGNOSIS — M19071 Primary osteoarthritis, right ankle and foot: Secondary | ICD-10-CM | POA: Diagnosis not present

## 2023-10-07 DIAGNOSIS — M79671 Pain in right foot: Secondary | ICD-10-CM | POA: Diagnosis not present

## 2023-11-06 ENCOUNTER — Other Ambulatory Visit: Payer: Self-pay | Admitting: Internal Medicine

## 2023-11-19 ENCOUNTER — Encounter: Payer: Self-pay | Admitting: Internal Medicine

## 2023-11-19 NOTE — Telephone Encounter (Signed)
 Noted

## 2023-11-23 ENCOUNTER — Other Ambulatory Visit: Payer: Self-pay | Admitting: Internal Medicine

## 2023-11-24 ENCOUNTER — Telehealth: Payer: Self-pay

## 2023-11-24 ENCOUNTER — Encounter: Payer: Self-pay | Admitting: Internal Medicine

## 2023-11-24 NOTE — Telephone Encounter (Signed)
 Copied from CRM [id number]. Topic: Clinical - Medication Question >> Nov 24, 2023  1:23 PM [place] A wrote: Reason for CRM: Patient asked if she could get a partial refill for ALPRAZolam  (XANAX ) 0.25 MG tablet due to her appointment being at the end of [facility] contact

## 2023-11-24 NOTE — Telephone Encounter (Signed)
 xanax  Last Visit: 04/25/2023 Next Visit: 12/25/2023 Last Refill:  Notes to Pharmacy: LAST REFILL WITHOUT IAN OFFICE VISIT , PLEASE SCHEDULE   E-Prescribing Status: Receipt confirmed by pharmacy (11/24/2023 12:30 PM EDT)    Please Advise

## 2023-11-24 NOTE — Telephone Encounter (Signed)
 called pt Brenda Leon notifying her to call offiice back to schedule appointment with DR Tullo for a follow up as PCP noted Return in about 6 months (around 10/23/2023).

## 2023-12-04 DIAGNOSIS — H04123 Dry eye syndrome of bilateral lacrimal glands: Secondary | ICD-10-CM | POA: Diagnosis not present

## 2023-12-04 DIAGNOSIS — M19042 Primary osteoarthritis, left hand: Secondary | ICD-10-CM | POA: Diagnosis not present

## 2023-12-04 DIAGNOSIS — M329 Systemic lupus erythematosus, unspecified: Secondary | ICD-10-CM | POA: Diagnosis not present

## 2023-12-04 DIAGNOSIS — Z79899 Other long term (current) drug therapy: Secondary | ICD-10-CM | POA: Diagnosis not present

## 2023-12-04 DIAGNOSIS — M19041 Primary osteoarthritis, right hand: Secondary | ICD-10-CM | POA: Diagnosis not present

## 2023-12-19 ENCOUNTER — Other Ambulatory Visit: Payer: Self-pay | Admitting: Internal Medicine

## 2023-12-25 ENCOUNTER — Encounter: Payer: Self-pay | Admitting: Internal Medicine

## 2023-12-25 ENCOUNTER — Ambulatory Visit (INDEPENDENT_AMBULATORY_CARE_PROVIDER_SITE_OTHER): Admitting: Internal Medicine

## 2023-12-25 VITALS — BP 150/78 | HR 75 | Ht 67.0 in | Wt 207.0 lb

## 2023-12-25 DIAGNOSIS — I10 Essential (primary) hypertension: Secondary | ICD-10-CM

## 2023-12-25 DIAGNOSIS — M5441 Lumbago with sciatica, right side: Secondary | ICD-10-CM | POA: Diagnosis not present

## 2023-12-25 DIAGNOSIS — T39395A Adverse effect of other nonsteroidal anti-inflammatory drugs [NSAID], initial encounter: Secondary | ICD-10-CM | POA: Diagnosis not present

## 2023-12-25 DIAGNOSIS — M25551 Pain in right hip: Secondary | ICD-10-CM

## 2023-12-25 DIAGNOSIS — K2960 Other gastritis without bleeding: Secondary | ICD-10-CM | POA: Diagnosis not present

## 2023-12-25 MED ORDER — SERTRALINE HCL 50 MG PO TABS
50.0000 mg | ORAL_TABLET | Freq: Every day | ORAL | 1 refills | Status: AC
Start: 2023-12-25 — End: ?

## 2023-12-25 MED ORDER — GABAPENTIN 100 MG PO CAPS: 100.0000 mg | ORAL_CAPSULE | Freq: Three times a day (TID) | ORAL | 3 refills | Status: AC

## 2023-12-25 MED ORDER — HYDROCODONE-ACETAMINOPHEN 10-325 MG PO TABS: 1.0000 | ORAL_TABLET | Freq: Four times a day (QID) | ORAL | 0 refills | Status: AC | PRN

## 2023-12-25 MED ORDER — PREDNISONE 10 MG PO TABS: ORAL_TABLET | ORAL | 0 refills | Status: AC

## 2023-12-25 MED ORDER — AMLODIPINE BESYLATE 5 MG PO TABS: 5.0000 mg | ORAL_TABLET | Freq: Every day | ORAL | 1 refills | Status: AC

## 2023-12-25 MED ORDER — AMLODIPINE BESYLATE 2.5 MG PO TABS: 2.5000 mg | ORAL_TABLET | Freq: Every day | ORAL | 1 refills | Status: DC

## 2023-12-25 NOTE — Patient Instructions (Signed)
 6 tablets all at once on Days  1 , 2 AND 3,  THE BEGIN TO TAPER  YOUR DOSE  by 1 tablet daily until gone:  (6-6-6-5-4-3-2-1-)   8 days total  Hydrocodone  every 6 hours  as needed .     I am prescribing gabapentin to help with your sciatica  pain.  You can start with 100 mg at bedtime  and either increase the dose at bedtime up to 300 mg gradually OR:  you can add a morning and afternoon dose if needed  If you tolerate this medication and find that it helps your pain , we can increase the dose gradually as needed , up to 2400 mg daily    The most common side effects are fluid retention and dizziness.    NO NAPROXEN OR IBUPROFEN WHILE TAKING PREDNISONE   PT REFERRAL MADE TO [facility] IN [place]   2) YOUR BLOOD PRESSURE IS STILL  HIGH   It should be 130/80 or less.  Please increase your amlodipine  to 5 mg daily,   check your blood pressure once a day at home  starting next and send me the readings

## 2023-12-25 NOTE — Progress Notes (Signed)
 Subjective:  Patient ID: Brenda Leon, female    DOB: [birth year]  Age: 78 y.o. MRN: [id number]  CC: The primary encounter diagnosis was Acute right-sided low back pain with right-sided sciatica. 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   HPI Brenda Leon presents for  Chief Complaint  Patient presents with   Medical Management of Chronic Issues    1) HTN:  Hypertension: patient has not been checking her blood pressure lately  at home.  Has been   Taking amlodipine  2.5 mg daily . 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  THE PAIN IS SEVERE AND RADIATES DOWN RIGHT LEG.   She has been using tylenol  naproxen and tyolenol #3 to manage her pain .  She has a history of herniated disk in  2008  which presented similarly but she avoided URGERY  BY DOING PT   4) RIGHT HIP PAIN STARTED  3 WEEKS  AGO.  SHE  RECEIVED A STEROID SHOT BY [facility] IN THE RIGHT HIP FOR TREATMENT OF BURSITIS AND IT PARTIALLY RELEIVED THE PAIN IN HER LOWER BACK  Outpatient Medications Prior to Visit  Medication Sig Dispense Refill   acetaminophen  (TYLENOL ) 650 MG CR tablet Take 1,300 mg by mouth in the morning and at bedtime.     ALPRAZolam  (XANAX ) 0.25 MG tablet TAKE 1 TABLET BY MOUTH AT BEDTIME AS NEEDED FOR ANXIETY OR SLEEP 30 tablet 0   aspirin  EC 81 MG tablet Take 81 mg by mouth daily. Swallow whole.     B Complex-C (SUPER B COMPLEX PO) Take 1 tablet by mouth daily.     Cholecalciferol (VITAMIN D3) 50 MCG (2000 UT) capsule      Flaxseed Oil OIL Take 1 capsule by mouth daily.     hydroxychloroquine (PLAQUENIL) 200 MG tablet Take 200 mg by mouth 2 (two) times daily.     hydroxychloroquine (PLAQUENIL) 200 MG tablet Take 1 tablet by mouth 2 (two) times daily.     MAGNESIUM GLYCINATE PO Take 1  capsule by mouth daily.     Multiple Vitamins-Minerals (PRESERVISION AREDS 2+MULTI VIT PO) Take 2 capsules by mouth daily.     Omeprazole  20 MG TBEC Take 20 mg by mouth daily.     Red Yeast Rice 600 MG CAPS Take by mouth. Takes two daily     TYRVAYA 0.03 MG/ACT SOLN Place 1 spray into both nostrils in the morning and at bedtime.     amLODipine  (NORVASC ) 2.5 MG tablet Take 1 tablet by mouth once daily 90 tablet 0   Propylene Glycol, PF, (SYSTANE COMPLETE PF) 0.6 % SOLN Place 1 drop into both ears daily as needed (Dry eyes).     sertraline  (ZOLOFT ) 50 MG tablet Take 1 tablet by mouth once daily with breakfast 90 tablet 0   traMADol  (ULTRAM ) 50 MG tablet TAKE 1 TABLET BY MOUTH ONCE DAILY AS NEEDED 30 tablet 5   amoxicillin -clavulanate (AUGMENTIN ) 875-125 MG tablet Take 1 tablet by mouth 2 (two) times daily. (Patient not taking: Reported on 08/08/2023) 14 tablet 0   azelastine  (OPTIVAR ) 0.05 % ophthalmic solution Place 1 drop into both eyes 2 (two) times daily. (Patient not taking: Reported on 08/08/2023) 6 mL 12   predniSONE  (DELTASONE ) 10 MG tablet 6  tablets on Day 1 , then reduce by 1 tablet daily until gone (Patient not taking: Reported on 08/08/2023) 21 tablet 0   sertraline  (ZOLOFT ) 25 MG tablet Take 1 tablet (25 mg total) by mouth daily with breakfast. (Patient not taking: Reported on 08/08/2023) 90 tablet 1   No facility-administered medications prior to visit.    Review of Systems;  Patient denies headache, fevers, malaise, unintentional weight loss, skin rash, eye pain, sinus congestion and sinus pain, sore throat, dysphagia,  hemoptysis , cough, dyspnea, wheezing, chest pain, palpitations, orthopnea, edema, abdominal pain, nausea, melena, diarrhea, constipation, flank pain, dysuria, hematuria, urinary  Frequency, nocturia, numbness, tingling, seizures,  Focal weakness, Loss of consciousness,  Tremor, insomnia, depression, anxiety, and suicidal ideation.      Objective:  BP (!) 150/78    Pulse 75   Ht 5' 7 (1.702 m)   Wt 207 lb (93.9 kg)   SpO2 98%   BMI 32.42 kg/m   BP Readings from Last 3 Encounters:  12/25/23 (!) 150/78  08/08/23 (!) 130/59  04/25/23 124/60    Wt Readings from Last 3 Encounters:  12/25/23 207 lb (93.9 kg)  08/08/23 205 lb (93 kg)  04/25/23 202 lb 4 oz (91.7 kg)    Physical Exam Vitals reviewed.  Constitutional:      General: She is not in acute distress.    Appearance: Normal appearance. She is normal weight. She is not ill-appearing, toxic-appearing or diaphoretic.  HENT:     Head: Normocephalic.  Eyes:     General: No scleral icterus.       Right eye: No discharge.        Left eye: No discharge.     Conjunctiva/sclera: Conjunctivae normal.  Cardiovascular:     Rate and Rhythm: Normal rate and regular rhythm.     Heart sounds: Normal heart sounds.  Pulmonary:     Effort: Pulmonary effort is normal. No respiratory distress.     Breath sounds: Normal breath sounds.  Musculoskeletal:        General: Tenderness present. Normal range of motion.       Arms:     Comments: Tenderness without redness   Skin:    General: Skin is warm and dry.  Neurological:     General: No focal deficit present.     Mental Status: She is alert and oriented to person, place, and time. Mental status is at baseline.     Cranial Nerves: Cranial nerves 2-12 are intact.     Sensory: Sensation is intact.     Motor: No weakness.     Coordination: Coordination is intact.     Comments: Patellar DTRS unable to elicit   Psychiatric:        Mood and Affect: Mood normal.        Behavior: Behavior normal.        Thought Content: Thought content normal.        Judgment: Judgment normal.     Lab Results  Component Value Date   HGBA1C 6.0 04/24/2023   HGBA1C 5.9 (H) 05/10/2022   HGBA1C 5.9 06/28/2021    Lab Results  Component Value Date   CREATININE 0.70 04/24/2023   CREATININE 0.53 06/11/2022   CREATININE 0.70 05/30/2022    Lab Results  Component  Value Date   WBC 13.9 (H) 06/12/2022   HGB 12.1 06/12/2022   HCT 37.6 06/12/2022   PLT 248 06/12/2022   GLUCOSE 94 04/24/2023   CHOL 212 (H) 04/24/2023  TRIG 105.0 04/24/2023   HDL 54.50 04/24/2023   LDLDIRECT 140.0 04/24/2023   LDLCALC 137 (H) 04/24/2023   ALT 12 04/24/2023   AST 16 04/24/2023   NA 140 04/24/2023   K 4.3 04/24/2023   CL 105 04/24/2023   CREATININE 0.70 04/24/2023   BUN 21 04/24/2023   CO2 30 04/24/2023   TSH 1.36 05/10/2022   HGBA1C 6.0 04/24/2023    MM 3D SCREENING MAMMOGRAM BILATERAL BREAST Result Date: 04/24/2023 CLINICAL DATA:  Screening. EXAM: DIGITAL SCREENING BILATERAL MAMMOGRAM WITH TOMOSYNTHESIS AND CAD TECHNIQUE: Bilateral screening digital craniocaudal and mediolateral oblique mammograms were obtained. Bilateral screening digital breast tomosynthesis was performed. The images were evaluated with computer-aided detection. COMPARISON:  Previous exam(s). ACR Breast Density Category a: The breasts are almost entirely fatty. FINDINGS: There are no findings suspicious for malignancy. IMPRESSION: No mammographic evidence of malignancy. A result letter of this screening mammogram will be mailed directly to the patient. RECOMMENDATION: Screening mammogram in one year. (Code:SM-B-01Y) BI-RADS CATEGORY  1: Negative. Electronically Signed   By: Rosaline Collet M.D.   On: 04/24/2023 15:47   DG Bone Density Result Date: 04/22/2023 EXAM: DUAL X-RAY ABSORPTIOMETRY (DXA) FOR BONE MINERAL DENSITY IMPRESSION: Dear Dr Marylynn, Your patient MINTIE WITHERINGTON completed a FRAX assessment on 04/22/2023 using the Lunar iDXA DXA System (analysis version: 14.10) manufactured by [employer]. The following summarizes the results of our evaluation. PATIENT BIOGRAPHICAL: Name: Dolly, Harbach Patient ID: [id number] Birth Date: 1946/01/28 Height:    67.5 in. Gender:     [age]    [age]       Weight:    203.0 lbs. Ethnicity:  White                            Exam Date: 04/22/2023  FRAX* RESULTS:  (version: 3.5) 10-year Probability of Fracture1 Major Osteoporotic Fracture2 Hip Fracture 11.8% 2.5% Population: [place]  (Caucasian) Risk Factors: None Based on Femur (Left) Neck BMD 1 -The 10-year probability of fracture may be lower than reported if the patient has received treatment. 2 -Major Osteoporotic Fracture: Clinical Spine, Forearm, Hip or Shoulder *FRAX is a armed forces logistics/support/administrative officer of the [employer] of [employer] for Metabolic Bone Disease, a World Science Writer (WHO) [employer]. ASSESSMENT: The probability of a major osteoporotic fracture is 11.8% within the next ten years. The probability of a hip fracture is 2.5% within the next ten years. Your patient Leeanne Butters completed a BMD test on 04/22/2023 using the [employer] DXA System (software version: 14.10) manufactured by [employer]. The following summarizes the results of our evaluation. Technologist: MTB PATIENT BIOGRAPHICAL: Name: Merry, Pond Patient ID: [id number] Birth Date: 06-Dec-1945 Height: 67.5 in. Gender: Female Exam Date: 04/22/2023 Weight: 203.0 lbs. Indications: Hysterectomy, Height Loss, Postmenopausal, Advanced Age, Caucasian Fractures: Treatments: Vitamin D , Prilosec, Multi-Vitamin DENSITOMETRY RESULTS: Site         Region     Measured Date Measured Age WHO Classification Young Adult T-score BMD         %Change vs. Previous Significant Change (*) AP Spine L1-L4 04/22/2023 76.5 Normal 0.0 1.193 g/cm2 2.0% - AP Spine L1-L4 12/10/2018 72.2 Normal -0.2 1.170 g/cm2 3.7% Yes AP Spine L1-L4 04/13/2012 65.5 Normal -0.5 1.128 g/cm2 - - DualFemur Neck Left 04/22/2023 76.5 Osteopenia -1.6 0.818 g/cm2 -4.7% - DualFemur Neck Left 12/10/2018 72.2 Osteopenia -1.3 0.858 g/cm2 -5.3% - DualFemur Neck Left 04/13/2012  65.5 Normal -1.0 0.906 g/cm2 - - DualFemur Total Mean 04/22/2023 76.5 Osteopenia -1.2 0.862 g/cm2 -3.7% Yes DualFemur Total Mean 12/10/2018 72.2 Normal -0.9 0.895 g/cm2 -3.7% Yes  DualFemur Total Mean 04/13/2012 65.5 Normal -0.6 0.929 g/cm2 - - Left Forearm Radius 33% 04/22/2023 76.5 Normal -0.3 0.848 g/cm2 -11.2% Yes Left Forearm Radius 33% 04/13/2012 65.5 Normal 0.9 0.955 g/cm2 - - ASSESSMENT: The BMD measured at Femur Neck Left is 0.818 g/cm2 with a T-score of -1.6. This patient is considered osteopenic according to World Health Organization [facility]) criteria. The scan quality is good. Compared with prior study, there has been no significant change in the spine. Compared with prior study, there has been significant decrease in the total hip. World Science Writer [facility]) criteria for post-menopausal, Caucasian Women: Normal:                   T-score at or above -1 SD Osteopenia/low bone mass: T-score between -1 and -2.5 SD Osteoporosis:             T-score at or below -2.5 SD RECOMMENDATIONS: 1. All patients should optimize calcium  and vitamin D  intake. 2. Consider FDA-approved medical therapies in postmenopausal women and men aged 55 years and older, based on the following: a. A hip or vertebral(clinical or morphometric) fracture b. T-score < -2.5 at the femoral neck or spine after appropriate evaluation to exclude secondary causes c. Low bone mass (T-score between -1.0 and -2.5 at the femoral neck or spine) and a 10-year probability of a hip fracture > 3% or a 10-year probability of a major osteoporosis-related fracture > 20% based on the [place] -adapted WHO algorithm 3. Clinician judgment and/or patient preferences may indicate treatment for people with 10-year fracture probabilities above or below these levels FOLLOW-UP: People with diagnosed cases of osteoporosis or at high risk for fracture should have regular bone mineral density tests. For patients eligible for Medicare, routine testing is allowed once every 2 years. The testing frequency can be increased to one year for patients who have rapidly progressing disease, those who are receiving or discontinuing medical therapy to restore  bone mass, or have additional risk factors. I have reviewed this report, and agree with the above findings. [facility] Electronically Signed   By: Rosaline Collet M.D.   On: 04/22/2023 15:11    Assessment & Plan:  .Acute right-sided low back pain with right-sided sciatica Assessment & Plan: She has a positive stright leg  lift on the right.   Pain not relieved with tramadol  and she has a history of NSAID induced gastritis  Prednisone  , gabapentin and hydrocodone prescribed  Orders: -     Ambulatory referral to Physical Therapy  Pain of right hip Assessment & Plan: Attributed to bursitis by [facility]  s/p I/a right hip steroid injection 3 weeks ago    Gastritis due to nonsteroidal anti-inflammatory drug Assessment & Plan: Symptoms resolved with  PPI use and NSAID suspension    Essential hypertension Assessment & Plan: Elevated today.  Review of rheumatology office note from oct 8 notes elevation as  well.  Increasing amlodipine  dose to 5 mg daily    Other orders -     Sertraline  HCl; Take 1 tablet (50 mg total) by mouth daily with breakfast.  Dispense: 90 tablet; Refill: 1 -     predniSONE ; 6 tablets daily for 3 days, then reduce by 1 tablet daily until gone  Dispense: 33 tablet; Refill: 0 -     HYDROcodone-Acetaminophen ;  Take 1 tablet by mouth every 6 (six) hours as needed.  Dispense: 30 tablet; Refill: 0 -     Gabapentin; Take 1 capsule (100 mg total) by mouth 3 (three) times daily.  Dispense: 90 capsule; Refill: 3 -     amLODIPine  Besylate; Take 1 tablet (5 mg total) by mouth daily.  Dispense: 90 tablet; Refill: 1    I personally spent a total of 30 minutes in the care of the patient today including performing a medically appropriate exam/evaluation, counseling and educating, placing orders, documenting clinical information in the EHR, independently interpreting results, and communicating results.  Follow-up: No follow-ups on file.   Verneita LITTIE Kettering, MD

## 2023-12-25 NOTE — Assessment & Plan Note (Signed)
 Symptoms resolved with  PPI use and NSAID suspension

## 2023-12-25 NOTE — Assessment & Plan Note (Signed)
 Elevated today.  Review of rheumatology office note from oct 8 notes elevation as  well.  Increasing amlodipine  dose to 5 mg daily

## 2023-12-25 NOTE — Assessment & Plan Note (Addendum)
 Attributed to bursitis by [facility]  s/p I/a right hip steroid injection 3 weeks ago

## 2023-12-25 NOTE — Assessment & Plan Note (Signed)
 She has a positive stright leg  lift on the right.   Pain not relieved with tramadol  and she has a history of NSAID induced gastritis  Prednisone  , gabapentin and hydrocodone prescribed

## 2023-12-29 ENCOUNTER — Encounter: Payer: Self-pay | Admitting: Internal Medicine

## 2024-01-07 ENCOUNTER — Encounter: Payer: Self-pay | Admitting: Internal Medicine

## 2024-01-07 NOTE — Telephone Encounter (Signed)
 N/a

## 2024-01-07 NOTE — Telephone Encounter (Signed)
 Nothing to be done at this time

## 2024-01-08 ENCOUNTER — Encounter: Payer: Self-pay | Admitting: Internal Medicine

## 2024-01-09 ENCOUNTER — Encounter: Payer: Self-pay | Admitting: Internal Medicine

## 2024-01-18 ENCOUNTER — Other Ambulatory Visit: Payer: Self-pay | Admitting: Internal Medicine

## 2024-01-28 DIAGNOSIS — M5416 Radiculopathy, lumbar region: Secondary | ICD-10-CM | POA: Diagnosis not present

## 2024-01-28 DIAGNOSIS — M545 Low back pain, unspecified: Secondary | ICD-10-CM | POA: Diagnosis not present

## 2024-02-04 DIAGNOSIS — M545 Low back pain, unspecified: Secondary | ICD-10-CM | POA: Diagnosis not present

## 2024-03-04 ENCOUNTER — Encounter: Payer: Self-pay | Admitting: Internal Medicine

## 2024-03-05 NOTE — Telephone Encounter (Signed)
 Spoke to pt regarding message below per Dr Marylynn pt verbalizes understanding

## 2024-03-08 ENCOUNTER — Encounter: Payer: Self-pay | Admitting: Internal Medicine

## 2024-03-24 ENCOUNTER — Encounter: Payer: Self-pay | Admitting: Internal Medicine

## 2024-03-30 ENCOUNTER — Ambulatory Visit: Payer: Self-pay

## 2024-04-06 ENCOUNTER — Ambulatory Visit
